# Patient Record
Sex: Female | Born: 1945
Health system: Southern US, Community
[De-identification: ages and names within clinical notes are randomized; demographics above are authoritative.]

## PROBLEM LIST (undated history)

## (undated) DIAGNOSIS — I219 Acute myocardial infarction, unspecified: Secondary | ICD-10-CM

## (undated) DIAGNOSIS — E119 Type 2 diabetes mellitus without complications: Secondary | ICD-10-CM

## (undated) DIAGNOSIS — K219 Gastro-esophageal reflux disease without esophagitis: Secondary | ICD-10-CM

## (undated) DIAGNOSIS — R0602 Shortness of breath: Secondary | ICD-10-CM

## (undated) DIAGNOSIS — I2510 Atherosclerotic heart disease of native coronary artery without angina pectoris: Secondary | ICD-10-CM

## (undated) DIAGNOSIS — E785 Hyperlipidemia, unspecified: Secondary | ICD-10-CM

## (undated) DIAGNOSIS — E039 Hypothyroidism, unspecified: Secondary | ICD-10-CM

## (undated) DIAGNOSIS — J45909 Unspecified asthma, uncomplicated: Secondary | ICD-10-CM

## (undated) DIAGNOSIS — I10 Essential (primary) hypertension: Secondary | ICD-10-CM

## (undated) HISTORY — PX: CORONARY ARTERY BYPASS GRAFT: SHX141

## (undated) HISTORY — PX: VAGINAL HYSTERECTOMY: SUR661

## (undated) HISTORY — PX: WRIST SURGERY: SHX841

---

## 2000-04-11 ENCOUNTER — Inpatient Hospital Stay (HOSPITAL_COMMUNITY): Admission: AD | Admit: 2000-04-11 | Discharge: 2000-04-20 | Payer: Self-pay | Admitting: Cardiology

## 2000-04-12 ENCOUNTER — Encounter: Payer: Self-pay | Admitting: Thoracic Surgery (Cardiothoracic Vascular Surgery)

## 2000-04-13 ENCOUNTER — Encounter: Payer: Self-pay | Admitting: Thoracic Surgery (Cardiothoracic Vascular Surgery)

## 2000-04-14 ENCOUNTER — Encounter: Payer: Self-pay | Admitting: Thoracic Surgery (Cardiothoracic Vascular Surgery)

## 2007-08-14 ENCOUNTER — Ambulatory Visit: Payer: Self-pay | Admitting: Oncology

## 2011-04-16 NOTE — Discharge Summary (Signed)
[place]  Patient:    Tonya Richard, Tonya Richard                       MRN: [id number] Adm. Date:  [id number] Disc. Date: [id number] Attending:  Modesto Charon Dictator:   Marcellus Scott, P.A. CC:         Ocie Doyne, M.D., [facility], [address] [place], [place], [place] [address]             Despina Hick, M.D., [facility]             [facility], [address], [place], [place] [address]                           Discharge Summary  DATE OF BIRTH:  2045-12-26  FINAL DIAGNOSES: 1. Unstable angina. 2. Severe three-vessel atherosclerotic coronary artery disease with    50-60% left main stenosis. 3. Postoperative anemia requiring transfusion on postoperative day #1.  SECONDARY DIAGNOSES 1. Type 2 diabetes mellitus, insulin dependent. 2. Hyperlipidemia. 3. Bronchospastic lung disease. 4. Hypothyroidism. 5. Status post hysterectomy. 6. History of tobacco habituation having quit 15 years ago. 7. Hypertension.  PROCEDURES:  On Apr 12, 2000, coronary artery bypass graft surgery x 4 by Revonda Standard. Roxan Hockey, M.D.  In this procedure, the left internal mammary artery was connected in an end-to-side fashion to the left anterior descending coronary artery, the left internal mammary artery was connected in an end-to-side fashion to the left anterior descending coronary artery, a reverse saphenous vein graft was fashioned from the aorta to the first diagonal, a reverse saphenous vein graft was fashioned from the aorta to the first obtuse marginal, and a reverse saphenous vein graft was fashioned from the aorta to the distal right coronary artery.  The patient tolerated the procedure well and was transferred in stable satisfactory condition to the intensive care unit under no inotropic support.  DISPOSITION:  Tonya Richard is a suitable candidate for discharge on postoperative day #8.  She has been afebrile in the  postoperative period.  She has experienced no cardiac dysrhythmias and no respiratory compromise.  She was relieved of all supplement oxygen by postoperative day #5.  She was some 20 pounds fluid overloaded in the postoperative period.  This was improved with gentle diuresis and she will go home on continuing Lasix diuresis to achieve her normal weight.  She has been started on an ACE inhibitor and Lopressor while in the hospital.  She is ambulating well without desaturation. Her appetite is improving.  She has full GI tract function.  Her incisions are healing nicely.  There is no evidence of erythema, drainage, or swelling.  Her mental status has remained clear in the postoperative period.  DISCHARGE MEDICATIONS:  1. Percocet 5/325 mg one to two tablets p.o. q.4-6h. p.r.n. pain.  2. Glucophage 500 mg two tablets in the morning, one tablet at noon, and two     tablets at bedtime.  3. Glucotrol XL 10 mg one tablet in the morning.  4. Altace 1.25 mg b.i.d.  5. Folic acid 1 mg daily.  6. Ferrous sulfate 325 mg b.i.d.  7. Lasix 40 mg daily.  8. Potassium chloride 20 mEq daily.  9. Lopressor 50 mg one half tablet in the morning and one half tablet in the  evening. 10. Actos 30 mg daily. 11. Premarin 0.625 mg daily. 12. Synthroid 50 mcg daily. 13. Enteric-coated aspirin 325 mg daily. 14. Multivitamins daily. 15. Humulin N 30 units at bedtime subcutaneously.  DISCHARGE ACTIVITY:  Ambulation as tolerated.  She is asked not to lift more than 10 pounds nor to drive for the six weeks.  DISCHARGE DIET:  Low-sodium, low-cholesterol, ADA diet.  WOUND CARE:  She may bathe daily, keeping incisions clean and dry.  FOLLOW-UP:  Office visit with Despina Hick, M.D.  She is asked to arrange for the visit two weeks after discharge.  A chest x-ray will be taken at Dr. Louis Matte office.  She will also have an office visit with Remo Lipps C. Roxan Hockey, M.D., three weeks after discharge and Dr.  Celestia Khat office will call with the appointment.  She is asked to bring the chest x-ray to the appointment.  She is also urged to see Lucienne Capers, M.D., her primary care giver, to check in about diabetes control.  She is asked to make the appointment one week after discharge.  HISTORY OF PRESENT ILLNESS:  Tonya Richard is a 65 year old female with a history of type 2 diabetes mellitus and hyperlipidemia, who was transferred on Apr 11, 2000, from [facility] by Despina Hick, M.D., for probable coronary artery bypass graft surgery.  She underwent left heart catheterization on Apr 11, 2000.  The study demonstrated severe three vessel atherosclerotic coronary artery disease and a 50-60% distal left main occlusion.  The patient reports a one-month history of substernal chest tightness lasting 10-15 minutes whenever she performed exertional activity, for example working in the garden or lifting things at work.  Her chest symptoms would sometimes occur at rest.  She thought it was gastrointestinal distress.  She did not experience radiation to the neck or arms nor nausea or diaphoresis.  She did get short of breath with the chest pain and this was relieved with rest.  She had a negative dobutamine Cardiolite study at [facility], but despite Dr. Woody Seller felt that she needed diagnostic catheterization.  She arrives at [employer] in transfer from [facility] on Apr 11, 2000, in stable condition.  HOSPITAL COURSE:  After arrival at [facility] on Apr 11, 2000, with a diagnosis of severe three-vessel atherosclerotic coronary artery disease, she was seen by Carlena Bjornstad, M.D., of the [facility].  The catheterization films were reviewed concurrently with Remo Lipps C. Roxan Hockey, M.D., of the [facility] [place].  The conclusion was that she has severe three-vessel atherosclerotic  coronary artery disease and was in need of coronary artery bypass graft surgery.  She was not placed on IV heparin or IV nitroglycerin  since she has been stable.  Dr. Roxan Hockey described the risks and benefits of revascularization surgery.  The patient accepted these risks and elected to undergo the procedure.  This was done on Apr 12, 2000.  Four bypasses were placed as previously described.  She was transferred in stable and satisfactory condition to the intensive care unit on no inotropic support. She was extubated on the day of surgery.  On postoperative day #1, her hemoglobin was 7.8, falling to 7.2 through the day.  She was transfused with one unit of packed red blood cells.  She was in a sinus rhythm and has remained in sinus rhythm throughout her postoperative course.  She has also been afebrile throughout her postoperative course.  Her creatinine  on postoperative day #1 was 0.6.  On postoperative day #2, after transfusion, her hematocrit was 26%.  She was somewhat hypotensive.  She was 20 pounds fluid positive.  On postoperative day #3, she was achieving 97% oxygen saturations on 1 L of nasal cannula.  A gentle diuresis was begun since she had achieved a normotensive state.  On postoperative day #4, her hemoglobin was 8.4.  She was achieving 98% oxygen saturations on 1 L of nasal cannula.  She was started on iron supplementation.  She was 17 pounds fluid overload.  On postoperative day #5, she was relieved of all supplemental oxygen, achieving good oxygen saturations on room air.  Her hemoglobin was 7.7.  She was restarted on her Glucophage which she had been taking preoperatively and she had an acceptable creatinine level for restarting this drug.  She was also started on an ACE 1 inhibitor.  On postoperative day #6, her hemoglobin was 8.2.  She was only 14 pounds fluid overloaded.  Her pacing wires were discontinued since she had maintained a sinus rhythm postoperatively.  On  postoperative day #7, she was achieving 95% oxygen saturation on room air.  Her wounds looked good.  The incisions were healing nicely without evidence of erythema or drainage.  The serum blood glucose was well controlled.  She was on all of her oral hypoglycemic medications, including Glucophage, Glucotrol, and Actos.  She was judged a suitable candidate for discharge on Apr 20, 2000, postoperative day #8.  She goes home on the medications as described with follow-up with both Despina Hick, M.D., and Revonda Standard. Roxan Hockey, M.D. DD:  04/19/00 TD:  04/22/00 Job: [id number]

## 2011-04-16 NOTE — Op Note (Signed)
[place]  Patient:    Tonya Richard, Tonya Richard                       MRN: [id number] Proc. Date: 04/12/00 Adm. Date:  [id number] Attending:  Modesto Charon CC:         Despina Hick, M.D.             Dr. Wende Neighbors, [place]                           Operative Report  PREOPERATIVE DIAGNOSIS:  Three-vessel coronary disease, unstable angina.  POSTOPERATIVE DIAGNOSIS:  Three-vessel coronary disease, unstable angina.  OPERATION PERFORMED:  Median sternotomy, extracorporeal circulation, coronary artery bypass grafting x 4 (left internal mammary artery to left anterior descending, saphenous vein graft to first diagonal, saphenous vein graft to first obtuse marginal, saphenous vein graft to distal right coronary artery).  SURGEON:  Revonda Standard. Roxan Hockey, M.D.  ASSISTANT:  Len Childs, M.D.  ANESTHESIA:  General.  OPERATIVE FINDINGS:  Diffusely diseased coronaries, right coronary poor target, small vein but of good quality, small mammary good quality.  Normal left ventricular size and function.  INDICATIONS FOR PROCEDURE:  The patient is a 65 year old female with a history of insulin-dependent diabetes and hypertension and remote tobacco abuse.  She presented with both exertional and rest anginal symptoms, underwent cardiac catheterization by Dr. Woody Seller which showed severe three-vessel coronary disease and left main disease.  She was referred for coronary artery bypass grafting. The indications, risks, benefits and alternative treatments were discussed in detail with the patient and her family.  She understood these and agreed to proceed.  DESCRIPTION OF PROCEDURE:  The patient was brought to the preop holding area on Apr 12, 2000.  Lines were placed to monitor arterial, central venous and pulmonary arterial pressure.  EKG leads were placed for continuous telemetry. The patient was taken to the operating room, anesthetized and intubated.   A Foley catheter was placed.  Intravenous antibiotics were administered.  The chest abdomen and legs were prepped and draped in the usual fashion.  A median sternotomy was performed.  Simultaneously, incision was made in the medial aspect of the left leg and the greater saphenous vein was harvested from the ankle to the lower thigh.  The saphenous vein was of small caliber but good quality with no varicosities and few branches.  The left internal mammary artery was harvested in the standard fashion.  It was a small vessel but of good quality.  It was divided distally after fully heparinizing the patient.  There was good flow through the vessel.  The mammary was placed in a papaverine soaked sponge and placed into the left pleural space.  The pericardium was opened.  The ascending aorta was palpated.  The aorta was small approximately 2.5 cm in diameter.  There was no palpable atherosclerotic disease.  The aorta was cannulated via concentric 2-0 Ethibond nonpledgeted pursestring sutures. The dual stage venous cannula was placed via a pursestring suture in the right atrial appendage.  Cardiopulmonary bypass was instituted.  The patient was cooled to 32 degrees Celsius.  The coronary arteries were inspected.  Anastomotic sites were chosen.  The conduits were inspected and cut to length.  A foam pad was placed in the pericardium to protect the left phrenic nerve and a temperature probe was placed in the myocardial septum.  A cardioplegia cannula was placed  in the ascending aorta.  The aorta was crossclamped.  The left ventricle was emptied via the aortic root vent.  Cardiac arrest then was achieved with a combination of cold antegrade blood cardioplegia and topical iced saline. 500 cc of cardioplegia was administered.  The myocardial septal temperature was 9 degrees Celsius. The following distal anastomoses were performed.  First a reversed saphenous vein graft was placed end-to-side to the  distal right coronary.  The distal right coronary was a poor quality vessel.  It was heavily diseased.  The right coronary was totaled proximally. There were multiple smal branches coming off the distal right coronary artery and a 1 mm probe did pass into several of these branches.  None of these branches were large enough to graft separately.  There was some atherosclerotic disease at the site of the anastomosis.  The anastomosis was performed end-to-side using a running 7-0 Prolene suture.  Additional cardioplegia was administered down the graft.  There was good hemostasis at the anastomosis.  Next a reversed saphenous vein graft was placed end-to-side to the large obtuse marginal branch of the left circumflex coronary artery.  There was only one dominant marginal branch supplying the posterolateral wall.  This was a 1.5 mm vessel.  It was diffusely diseased but there was no significant atherosclerotic disease at the site of the anastomosis.  The vein graft again was relatively small in caliber but of good quality.  The coronary was a 1.5 mm vessel.  The anastomosis was performed end-to-side using a running 7-0 Prolene suture.  Additional cardioplegia was administered down the graft. There was slight bleeding at the toe of the anastomosis which was repaired with a single adventitial 7-0 figure-of-eight suture.  Next a reversed saphenous vein graft was placed end-to-side to the first diagonal branch of the LAD.  This was a large anterolateral branch.  It was 1.5 mm in diameter.  It was also diffusely diseased but was free of disease at the site of the anastomosis.  The vein graft was again small caliber but of good quality.  The anastomosis was performed with a running 7-0 Prolene suture in an end-to-side fashion.  Additional cardioplegia was administered down the three vein grafts.  There was good hemostasis at the diagonal anastomosis.  Next the left internal mammary artery was  brought through a window in the pericardium anterior to the left phrenic nerve. The distal end was cut to  length and spatulated in preparation for the end-to-side anastomosis.  The LAD was opened.  It was a 1.5 mm good quality vessel at the site of the anastomosis although there was still mild atherosclerotic disease throughout the course of the vessel.  There was a tight stenosis proximally.  The anastomosis was performed end-to-side using a running 8-0 Prolene suture.  At the completion of the mammary to LAD anastomosis, the bulldog clamp was removed from the mammary artery.  Immediate and rapid septal rewarming was noted.  Lidocaine was administered.  The mammary pedicle was tacked to the epicardial surface of the heart with 6-0 Prolene sutures.  The aortic crossclamp was removed.  Total crossclamp time was 50 minutes.  A single defibrillation with 20 joules was required. The patient then resumed spontaneous sinus rhythm.  A partial occlusion clamp was placed on the ascending aorta.  The cardioplegia was removed.  Proximal vein graft anastomoses were performed to 4.0 mm punch aortotomies using running 6-0 Prolene sutures.  At the completion of the final proximal anastomosis the patient was  placed in Trendelenburg position.  Air was allowed to vent as the partial clamp was removed prior to tying the suture.  Air was then aspirated from each of the vein grafts.  The bulldog clamps were removed and flow was restored through all the vein grafts.  The proximal anastomoses were inspected for hemostasis.  Epicardial pacing wires were placed on the right ventricle and right atrium and the patient was rewarmed.  The patient was weaned from cardiopulmonary bypass when the core temperature reached 37 degrees Celsius.  Total bypass time was 103 minutes. The patient was on no inotropic support at the time of separation from bypass.  A test dose of protamine was administered and was well  tolerated.  The atrial and aortic cannulae were removed.  There was good hemostasis at both cannulation sites.  The remainder of the protamine was administered without incident.  Hemostasis was achieved.  The chest was irrigated with 1L of warm normal saline containing 1 gm of vancomycin. The left pleural and two mediastinal chest tubes were placed through separate subcostal incisions.  The pericardium was reapproximated over the aorta and base of the heart with interrupted 3-0 silk sutures.  The sternum was closed with interrupted stainless steel wires and the pectoralis fascia was closed with a running #1 Vicryl suture.  In both the chest and the legs, the subcutaneous tissues were closed with running 2-0 Vicryl suture and the skin was closed with 3-0 Vicryl subcuticular suture.  Sponge, needle and instrument counts were correct at the end of the procedure.  The patient remained hemodynamically stable throughout the postbypass period and was taken from the operating room to the surgical intensive care unit in stable condition.  DD:  04/12/00 TD:  04/15/00 Job: [id number]

## 2014-08-16 ENCOUNTER — Inpatient Hospital Stay (HOSPITAL_COMMUNITY)
Admission: AD | Admit: 2014-08-16 | Discharge: 2014-08-20 | DRG: 683 | Disposition: A | Discharge status: Home / Self Care | Payer: Medicare Other | Source: Other Acute Inpatient Hospital | Attending: Internal Medicine | Admitting: Internal Medicine

## 2014-08-16 ENCOUNTER — Encounter (HOSPITAL_COMMUNITY): Payer: Self-pay | Admitting: Internal Medicine

## 2014-08-16 DIAGNOSIS — E872 Acidosis, unspecified: Secondary | ICD-10-CM

## 2014-08-16 DIAGNOSIS — Z9861 Coronary angioplasty status: Secondary | ICD-10-CM

## 2014-08-16 DIAGNOSIS — N179 Acute kidney failure, unspecified: Secondary | ICD-10-CM | POA: Diagnosis present

## 2014-08-16 DIAGNOSIS — J45909 Unspecified asthma, uncomplicated: Secondary | ICD-10-CM

## 2014-08-16 DIAGNOSIS — E118 Type 2 diabetes mellitus with unspecified complications: Secondary | ICD-10-CM

## 2014-08-16 DIAGNOSIS — J020 Streptococcal pharyngitis: Secondary | ICD-10-CM

## 2014-08-16 DIAGNOSIS — Z7902 Long term (current) use of antithrombotics/antiplatelets: Secondary | ICD-10-CM | POA: Diagnosis not present

## 2014-08-16 DIAGNOSIS — Z6833 Body mass index (BMI) 33.0-33.9, adult: Secondary | ICD-10-CM | POA: Diagnosis not present

## 2014-08-16 DIAGNOSIS — E162 Hypoglycemia, unspecified: Secondary | ICD-10-CM

## 2014-08-16 DIAGNOSIS — I509 Heart failure, unspecified: Secondary | ICD-10-CM | POA: Diagnosis present

## 2014-08-16 DIAGNOSIS — I2510 Atherosclerotic heart disease of native coronary artery without angina pectoris: Secondary | ICD-10-CM | POA: Diagnosis present

## 2014-08-16 DIAGNOSIS — E785 Hyperlipidemia, unspecified: Secondary | ICD-10-CM | POA: Diagnosis present

## 2014-08-16 DIAGNOSIS — Z87891 Personal history of nicotine dependence: Secondary | ICD-10-CM

## 2014-08-16 DIAGNOSIS — Z951 Presence of aortocoronary bypass graft: Secondary | ICD-10-CM | POA: Diagnosis not present

## 2014-08-16 DIAGNOSIS — R197 Diarrhea, unspecified: Secondary | ICD-10-CM

## 2014-08-16 DIAGNOSIS — Z7982 Long term (current) use of aspirin: Secondary | ICD-10-CM | POA: Diagnosis not present

## 2014-08-16 DIAGNOSIS — E875 Hyperkalemia: Secondary | ICD-10-CM | POA: Diagnosis present

## 2014-08-16 DIAGNOSIS — E1169 Type 2 diabetes mellitus with other specified complication: Secondary | ICD-10-CM | POA: Diagnosis present

## 2014-08-16 DIAGNOSIS — Z794 Long term (current) use of insulin: Secondary | ICD-10-CM | POA: Diagnosis not present

## 2014-08-16 DIAGNOSIS — Z79899 Other long term (current) drug therapy: Secondary | ICD-10-CM

## 2014-08-16 DIAGNOSIS — E860 Dehydration: Secondary | ICD-10-CM | POA: Diagnosis present

## 2014-08-16 DIAGNOSIS — E119 Type 2 diabetes mellitus without complications: Secondary | ICD-10-CM | POA: Diagnosis present

## 2014-08-16 DIAGNOSIS — E039 Hypothyroidism, unspecified: Secondary | ICD-10-CM | POA: Diagnosis present

## 2014-08-16 DIAGNOSIS — I10 Essential (primary) hypertension: Secondary | ICD-10-CM | POA: Diagnosis present

## 2014-08-16 DIAGNOSIS — K219 Gastro-esophageal reflux disease without esophagitis: Secondary | ICD-10-CM | POA: Diagnosis present

## 2014-08-16 DIAGNOSIS — R1110 Vomiting, unspecified: Secondary | ICD-10-CM

## 2014-08-16 DIAGNOSIS — A0472 Enterocolitis due to Clostridium difficile, not specified as recurrent: Secondary | ICD-10-CM | POA: Diagnosis present

## 2014-08-16 DIAGNOSIS — N289 Disorder of kidney and ureter, unspecified: Secondary | ICD-10-CM | POA: Diagnosis present

## 2014-08-16 HISTORY — DX: Streptococcal pharyngitis: J02.0

## 2014-08-16 HISTORY — DX: Acute myocardial infarction, unspecified: I21.9

## 2014-08-16 HISTORY — DX: Essential (primary) hypertension: I10

## 2014-08-16 HISTORY — DX: Unspecified asthma, uncomplicated: J45.909

## 2014-08-16 HISTORY — DX: Shortness of breath: R06.02

## 2014-08-16 HISTORY — DX: Acidosis, unspecified: E87.20

## 2014-08-16 HISTORY — DX: Acute kidney failure, unspecified: N17.9

## 2014-08-16 HISTORY — DX: Hyperkalemia: E87.5

## 2014-08-16 HISTORY — DX: Gastro-esophageal reflux disease without esophagitis: K21.9

## 2014-08-16 HISTORY — DX: Atherosclerotic heart disease of native coronary artery without angina pectoris: I25.10

## 2014-08-16 HISTORY — DX: Vomiting, unspecified: R11.10

## 2014-08-16 HISTORY — DX: Morbid (severe) obesity due to excess calories: E66.01

## 2014-08-16 HISTORY — DX: Vomiting, unspecified: R19.7

## 2014-08-16 HISTORY — DX: Hypothyroidism, unspecified: E03.9

## 2014-08-16 HISTORY — DX: Hyperlipidemia, unspecified: E78.5

## 2014-08-16 HISTORY — DX: Acidosis: E87.2

## 2014-08-16 HISTORY — DX: Type 2 diabetes mellitus without complications: E11.9

## 2014-08-16 HISTORY — DX: Hypoglycemia, unspecified: E16.2

## 2014-08-16 LAB — URINALYSIS, ROUTINE W REFLEX MICROSCOPIC
Bilirubin Urine: NEGATIVE
Glucose, UA: NEGATIVE mg/dL
KETONES UR: NEGATIVE mg/dL
Leukocytes, UA: NEGATIVE
NITRITE: NEGATIVE
Protein, ur: NEGATIVE mg/dL
Specific Gravity, Urine: 1.01 (ref 1.005–1.030)
Urobilinogen, UA: 0.2 mg/dL (ref 0.0–1.0)
pH: 5 (ref 5.0–8.0)

## 2014-08-16 LAB — CBC WITH DIFFERENTIAL/PLATELET
BASOS ABS: 0 10*3/uL (ref 0.0–0.1)
BASOS PCT: 0 % (ref 0–1)
Eosinophils Absolute: 0 10*3/uL (ref 0.0–0.7)
Eosinophils Relative: 0 % (ref 0–5)
HCT: 33.7 % — ABNORMAL LOW (ref 36.0–46.0)
HEMOGLOBIN: 10.6 g/dL — AB (ref 12.0–15.0)
Lymphocytes Relative: 27 % (ref 12–46)
Lymphs Abs: 2.6 10*3/uL (ref 0.7–4.0)
MCH: 27.2 pg (ref 26.0–34.0)
MCHC: 31.5 g/dL (ref 30.0–36.0)
MCV: 86.4 fL (ref 78.0–100.0)
MONOS PCT: 8 % (ref 3–12)
Monocytes Absolute: 0.8 10*3/uL (ref 0.1–1.0)
NEUTROS PCT: 65 % (ref 43–77)
Neutro Abs: 6.2 10*3/uL (ref 1.7–7.7)
Platelets: 263 10*3/uL (ref 150–400)
RBC: 3.9 MIL/uL (ref 3.87–5.11)
RDW: 14.3 % (ref 11.5–15.5)
WBC: 9.6 10*3/uL (ref 4.0–10.5)

## 2014-08-16 LAB — GLUCOSE, CAPILLARY
GLUCOSE-CAPILLARY: 82 mg/dL (ref 70–99)
GLUCOSE-CAPILLARY: 112 mg/dL — AB (ref 70–99)
GLUCOSE-CAPILLARY: 65 mg/dL — AB (ref 70–99)
Glucose-Capillary: 121 mg/dL — ABNORMAL HIGH (ref 70–99)
Glucose-Capillary: 52 mg/dL — ABNORMAL LOW (ref 70–99)

## 2014-08-16 LAB — RENAL FUNCTION PANEL
Albumin: 2.8 g/dL — ABNORMAL LOW (ref 3.5–5.2)
Anion gap: 22 — ABNORMAL HIGH (ref 5–15)
BUN: 81 mg/dL — ABNORMAL HIGH (ref 6–23)
CHLORIDE: 100 meq/L (ref 96–112)
CO2: 17 meq/L — AB (ref 19–32)
Calcium: 8.6 mg/dL (ref 8.4–10.5)
Creatinine, Ser: 7.94 mg/dL — ABNORMAL HIGH (ref 0.50–1.10)
GFR, EST AFRICAN AMERICAN: 5 mL/min — AB (ref >90)
GFR, EST NON AFRICAN AMERICAN: 5 mL/min — AB (ref >90)
Glucose, Bld: 64 mg/dL — ABNORMAL LOW (ref 70–99)
Phosphorus: 6.9 mg/dL — ABNORMAL HIGH (ref 2.3–4.6)
Potassium: 5.5 mEq/L — ABNORMAL HIGH (ref 3.7–5.3)
Sodium: 139 mEq/L (ref 137–147)

## 2014-08-16 LAB — HEPATIC FUNCTION PANEL
ALT: 10 U/L (ref 0–35)
AST: 18 U/L (ref 0–37)
Albumin: 2.8 g/dL — ABNORMAL LOW (ref 3.5–5.2)
Alkaline Phosphatase: 59 U/L (ref 39–117)
Bilirubin, Direct: <0.2 mg/dL (ref 0.0–0.3)
Total Bilirubin: <0.2 mg/dL — ABNORMAL LOW (ref 0.3–1.2)
Total Protein: 6.1 g/dL (ref 6.0–8.3)

## 2014-08-16 LAB — URINE MICROSCOPIC-ADD ON

## 2014-08-16 LAB — CREATININE, URINE, RANDOM: Creatinine, Urine: 19.76 mg/dL

## 2014-08-16 LAB — CK: CK TOTAL: 126 U/L (ref 7–177)

## 2014-08-16 LAB — SODIUM, URINE, RANDOM: Sodium, Ur: 79 mEq/L

## 2014-08-16 LAB — LACTIC ACID, PLASMA: Lactic Acid, Venous: 3.6 mmol/L — ABNORMAL HIGH (ref 0.5–2.2)

## 2014-08-16 MED ORDER — ACETAMINOPHEN 325 MG PO TABS
650.0000 mg | ORAL_TABLET | Freq: Four times a day (QID) | ORAL | Status: DC | PRN
  Administered 2014-08-16 – 2014-08-17 (×2): 650 mg via ORAL
  Filled 2014-08-16 (×2): qty 2

## 2014-08-16 MED ORDER — LEVOTHYROXINE SODIUM 50 MCG PO TABS
50.0000 ug | ORAL_TABLET | Freq: Every day | ORAL | Status: DC
  Administered 2014-08-17 – 2014-08-20 (×4): 50 ug via ORAL
  Filled 2014-08-16 (×5): qty 1

## 2014-08-16 MED ORDER — STERILE WATER FOR INJECTION IV SOLN
INTRAVENOUS | Status: DC
  Administered 2014-08-16: 18:00:00 via INTRAVENOUS
  Filled 2014-08-16 (×2): qty 9.7

## 2014-08-16 MED ORDER — ISOSORBIDE MONONITRATE ER 30 MG PO TB24
30.0000 mg | ORAL_TABLET | Freq: Every day | ORAL | Status: DC
  Administered 2014-08-16 – 2014-08-20 (×5): 30 mg via ORAL
  Filled 2014-08-16 (×5): qty 1

## 2014-08-16 MED ORDER — CLOPIDOGREL BISULFATE 75 MG PO TABS
75.0000 mg | ORAL_TABLET | Freq: Every day | ORAL | Status: DC
  Administered 2014-08-16 – 2014-08-20 (×5): 75 mg via ORAL
  Filled 2014-08-16 (×5): qty 1

## 2014-08-16 MED ORDER — ASPIRIN EC 81 MG PO TBEC
81.0000 mg | DELAYED_RELEASE_TABLET | Freq: Every day | ORAL | Status: DC
  Administered 2014-08-17 – 2014-08-20 (×4): 81 mg via ORAL
  Filled 2014-08-16 (×4): qty 1

## 2014-08-16 MED ORDER — INSULIN ASPART 100 UNIT/ML ~~LOC~~ SOLN
0.0000 [IU] | Freq: Three times a day (TID) | SUBCUTANEOUS | Status: DC
  Administered 2014-08-17: 13:00:00 via SUBCUTANEOUS
  Administered 2014-08-17: 2 [IU] via SUBCUTANEOUS
  Administered 2014-08-17 – 2014-08-18 (×3): 3 [IU] via SUBCUTANEOUS
  Administered 2014-08-19: 7 [IU] via SUBCUTANEOUS
  Administered 2014-08-19 (×2): 5 [IU] via SUBCUTANEOUS
  Administered 2014-08-20: 7 [IU] via SUBCUTANEOUS
  Administered 2014-08-20: 2 [IU] via SUBCUTANEOUS

## 2014-08-16 MED ORDER — IPRATROPIUM-ALBUTEROL 0.5-2.5 (3) MG/3ML IN SOLN
3.0000 mL | Freq: Four times a day (QID) | RESPIRATORY_TRACT | Status: DC | PRN
  Filled 2014-08-16: qty 3

## 2014-08-16 MED ORDER — HEPARIN SODIUM (PORCINE) 5000 UNIT/ML IJ SOLN
5000.0000 [IU] | Freq: Three times a day (TID) | INTRAMUSCULAR | Status: DC
  Administered 2014-08-17 – 2014-08-20 (×11): 5000 [IU] via SUBCUTANEOUS
  Filled 2014-08-16 (×12): qty 1

## 2014-08-16 MED ORDER — METOPROLOL SUCCINATE ER 50 MG PO TB24
50.0000 mg | ORAL_TABLET | Freq: Every day | ORAL | Status: DC
  Administered 2014-08-16 – 2014-08-20 (×5): 50 mg via ORAL
  Filled 2014-08-16 (×5): qty 1

## 2014-08-16 MED ORDER — ONDANSETRON HCL 4 MG PO TABS: 4.0000 mg | ORAL_TABLET | Freq: Four times a day (QID) | ORAL | Status: DC | PRN

## 2014-08-16 MED ORDER — ALPRAZOLAM 0.5 MG PO TABS
0.5000 mg | ORAL_TABLET | Freq: Every day | ORAL | Status: DC
  Administered 2014-08-17 – 2014-08-19 (×4): 0.5 mg via ORAL
  Filled 2014-08-16 (×4): qty 1

## 2014-08-16 MED ORDER — STERILE WATER FOR INJECTION IV SOLN
INTRAVENOUS | Status: DC
  Administered 2014-08-16 – 2014-08-18 (×3): via INTRAVENOUS
  Filled 2014-08-16 (×5): qty 850

## 2014-08-16 MED ORDER — ACETAMINOPHEN 650 MG RE SUPP: 650.0000 mg | Freq: Four times a day (QID) | RECTAL | Status: DC | PRN

## 2014-08-16 MED ORDER — ONDANSETRON HCL 4 MG/2ML IJ SOLN: 4.0000 mg | Freq: Four times a day (QID) | INTRAMUSCULAR | Status: DC | PRN

## 2014-08-16 NOTE — H&P (Addendum)
[facility] History and Physical  Tonya Richard [id number] DOB: November 13, 1946 DOA: 08/16/2014  Referring physician:  PCP: Wende Neighbors   Chief Complaint:   HPI: Tonya Richard Regina is a 68 y.o. female  With history of heart disease, diabetes type 2, hypothyroidism, hypertension, hyperlipidemia and recent strep pharyngitis who was transferred to [facility] from [place] emergency room with acute renal failure. She had labs drawn and are primary care provider's office yesterday and was told to come to the emergency room.  In the office, his BUN was 60, creatinine 6.47, potassium 5.3, bicarbonate 18. Today in the emergency room, BUN was 82 creatinine 8.2, potassium 5.2, bicarbonate 15, glucose 50.  Transferred to [facility] as Tonya Richard has no nephrologist.  CT abdomen pelvis without contrast shows normal kidneys without hydronephrosis.  Takes ibuprofen occasionally, about once a week. Is on ramipril, metformin, Lasix, as well as multiple other medications. In the emergency room, she got several amps of D50, 2 L of saline and 4 mg of IV Zofran  Patient has been on amoxicillin for about a week for the strep pharyngitis. Over the past few days, she has had periods of severe vomiting and diarrhea, no hematemesis or melena. No definite fevers or chills. No abdominal pain.  Review of Systems:  Systems reviewed. As above, otherwise negative.  Past Medical History  Diagnosis Date  . CAD (coronary artery disease), native coronary artery     h/o CABG, stent  . Hyperlipidemia   . DM type 2 (diabetes mellitus, type 2)   . Hypothyroidism   . GERD (gastroesophageal reflux disease)    Past Surgical History  Procedure Laterality Date  . Coronary artery bypass graft    . Vaginal hysterectomy    . Wrist surgery     Social History:  reports that she has quit smoking. She does not have any smokeless tobacco history on file. She reports that she does not drink alcohol or use illicit  drugs.  No Known Allergies  Family History  Problem Relation Age of Onset  . Diabetes Father   . Heart disease Father   . Heart disease Brother      Prior to Admission medications   Medication Sig Start Date End Date Taking? Authorizing Provider  ALPRAZolam Duanne Moron) 0.5 MG tablet Take 0.5 mg by mouth at bedtime.   Yes Historical Provider, MD  amoxicillin (AMOXIL) 500 MG capsule Take 1,000 mg by mouth 2 (two) times daily. #56 filled 08/09/14   Yes Historical Provider, MD  aspirin EC 81 MG tablet Take 81 mg by mouth daily.   Yes Historical Provider, MD  atorvastatin (LIPITOR) 80 MG tablet Take 80 mg by mouth daily.   Yes Historical Provider, MD  clopidogrel (PLAVIX) 75 MG tablet Take 75 mg by mouth daily.   Yes Historical Provider, MD  ezetimibe (ZETIA) 10 MG tablet Take 10 mg by mouth daily.   Yes Historical Provider, MD  furosemide (LASIX) 40 MG tablet Take 40 mg by mouth daily.   Yes Historical Provider, MD  glimepiride (AMARYL) 2 MG tablet Take 2 mg by mouth 4 (four) times daily.   Yes Historical Provider, MD  insulin NPH Human (HUMULIN N,NOVOLIN N) 100 UNIT/ML injection Inject 60 Units into the skin 2 (two) times daily before a meal.   Yes Historical Provider, MD  ipratropium-albuterol (DUONEB) 0.5-2.5 (3) MG/3ML SOLN Take 3 mLs by nebulization 4 (four) times daily as needed (shortness of breath/wheezing).   Yes Historical Provider, MD  isosorbide mononitrate (IMDUR) 30 MG 24 hr tablet Take 30 mg by mouth daily.   Yes Historical Provider, MD  levothyroxine (SYNTHROID, LEVOTHROID) 50 MCG tablet Take 50 mcg by mouth daily before breakfast.   Yes Historical Provider, MD  metFORMIN (GLUCOPHAGE) 500 MG tablet Take 1,000 mg by mouth 2 (two) times daily with a meal.   Yes Historical Provider, MD  metoprolol succinate (TOPROL-XL) 50 MG 24 hr tablet Take 50 mg by mouth daily. Take with or immediately following a meal.   Yes Historical Provider, MD  pantoprazole (PROTONIX) 40 MG tablet Take 40 mg  by mouth daily.   Yes Historical Provider, MD  ramipril (ALTACE) 10 MG capsule Take 10 mg by mouth daily.   Yes Historical Provider, MD   Physical Exam: Filed Vitals:   08/16/14 1444 08/16/14 1700  BP: 135/59 132/66  Pulse: 51 60  Temp: 98.1 F (36.7 C) 98 F (36.7 C)  TempSrc: Oral Oral  Resp: 18 18  Height: 5\' 1"  (1.549 m)   Weight: 79.9 kg (176 lb 2.4 oz)   SpO2: 98% 98%    Wt Readings from Last 3 Encounters:  08/16/14 79.9 kg (176 lb 2.4 oz)    BP 132/66  Pulse 60  Temp(Src) 98 F (36.7 C) (Oral)  Resp 18  Ht 5\' 1"  (1.549 m)  Wt 79.9 kg (176 lb 2.4 oz)  BMI 33.30 kg/m2  SpO2 98%  General Appearance:    Alert, cooperative, no distress, appears stated age  Head:    Normocephalic, without obvious abnormality, atraumatic  Eyes:    PERRL, conjunctiva/corneas clear, EOM's intact  Nose:   Nares normal, septum midline, mucosa normal, no drainage    or sinus tenderness  Throat:   Dry MM. No tonsillar erythema or exudate  Neck:   Supple, symmetrical, trachea midline, no adenopathy;    thyroid:  no enlargement/tenderness/nodules; no carotid   bruit or JVD  Back:     Symmetric, no curvature, ROM normal, no CVA tenderness  Lungs:     Clear to auscultation bilaterally, respirations unlabored  Chest Wall:    No tenderness or deformity   Heart:    Regular rate and rhythm, S1 and S2 normal, no murmur, rub   or gallop  Abdomen:     Soft, non-tender, bowel sounds active. obese  Genitalia:    deferred  Rectal:   deferred  Extremities:   Extremities normal, atraumatic, no cyanosis or edema  Pulses:   2+ and symmetric all extremities  Skin:   Skin dry with poor turgor  Lymph nodes:   Cervical, supraclavicular, and axillary nodes normal  Neurologic:   CNII-XII intact, normal strength, sensation and reflexes    throughout             Psych: normal affect  Labs TSH 1.0 blood cell count 12,000, hemoglobin 12.3, hematocrit 39, platelet count 310,000 with a normal differential  INR 1.0, PTT 26 sodium 141 potassium 5.2 chloride 99 bicarbonate 15 glucose 50 BUN 82 creatinine 8.2 anion gap 32 calcium 9.4 albumin 4.1 total protein 7.2 SGOT 1.7 SGPT 19 phosphatase 69 osmolality 293 troponin less than 0.01  Recent Labs Lab 08/16/14 1422 08/16/14 1654  GLUCAP 121* 82    Radiological Exams on Admission: No results found.  Assessment/Plan Principal Problem:   Acute renal failure, likely prerenal in the setting of vomiting and diarrhea and ACE inhibitor. Nephrology has been consulted and and is here evaluating the patient. Will hydrate and check stat labs  as well as CPK, urinalysis Ative Problems:   DM type 2 (diabetes mellitus, type 2)With hypoglycemia: Hold all home medications and just give sliding scale for now.    CAD (coronary artery disease), native coronary artery: Continue aspirin and Plavix    Vomiting and diarrhea with recent antibiotic use, will check for C. difficile.    Strep pharyngitis, has nearly completed her course of antibiotic. Will stop antibiotics.   Hypothyroidism with a normal TSH. Continue Synthroid   GERD (gastroesophageal reflux disease) hold protonixfor now pending C. difficile    HypoglycemiaSee above   Hyperkalemia, mild repeat labs stat telemetry continue him to her and Toprol. Stop ACE inhibitor.   Essential hypertension, benign   Asthma, chronic, stable    Metabolic acidosis secondary to ARF, diarrhea, possibly metformin. Will check lactate   Morbid obesity  Code Status: full Family Communication: daughter at bedside Disposition Plan: home when improved  Time spent: 52 minutes  [facility] Pager [phone]

## 2014-08-16 NOTE — Consult Note (Signed)
Reason for Consult: Acute Kidney Failure Referring Physician: Dr. Storm Frisk HPI:  Tonya Richard is an 68 y.o. female w/ PMHx of HTN, CHF, CAD, HLD, Asthma, DM type II, transferred from [facility] on 08/16/14 for elevated Cr. Patient had recently seen her PCP on 08/09/14 for a sore throat found to be rapid strep positive (according to the patient), started on Amoxicillin at that time. The patient returned on 07/15/14 for a 3 month follow up appointment and had some blood drawn which revealed an acutely increased Cr to ~6. Review of her previous lab records shows baseline Cr around 1. Ms. Cuadras also states that over the past 1 week, she has had diarrhea, nausea, and vomiting. She claims her bowel movements have been about 5 times daily, watery in nature, w/ no blood or mucus present. She claims she has been taking imodium to help with this, but still says she will have loose stools. She also claims she has been vomiting about 3 times daily, non-bloody, non-bilious in nature. She also admits to recent dizziness and lightheadedness over the past few days. The patient denies fever, chills, chest pain, or cough. The patient also states she has had significantly low blood sugars over the past 24 hours, as low as the 30's last night.    Available creatinine data as follows: 10/03/13   1.07 02/20/14   1.17 05/15/14   1.04 08/15/14    6.47 08/16/14    8.2   Past Medical History  Diagnosis Date  . CAD (coronary artery disease), native coronary artery     h/o CABG, stent  . Hyperlipidemia   . DM type 2 (diabetes mellitus, type 2)   . Hypothyroidism   . GERD (gastroesophageal reflux disease)     PSH:   Past Surgical History  Procedure Laterality Date  . Coronary artery bypass graft    . Vaginal hysterectomy    . Wrist surgery      Allergies: No Known Allergies  Medications:   Prior to Admission medications   Medication Sig Start Date End Date Taking? Authorizing Provider  ALPRAZolam  Duanne Moron) 0.5 MG tablet Take 0.5 mg by mouth at bedtime.   Yes Historical Provider, MD  amoxicillin (AMOXIL) 500 MG capsule Take 1,000 mg by mouth 2 (two) times daily. #56 filled 08/09/14   Yes Historical Provider, MD  aspirin EC 81 MG tablet Take 81 mg by mouth daily.   Yes Historical Provider, MD  atorvastatin (LIPITOR) 80 MG tablet Take 80 mg by mouth daily.   Yes Historical Provider, MD  clopidogrel (PLAVIX) 75 MG tablet Take 75 mg by mouth daily.   Yes Historical Provider, MD  ezetimibe (ZETIA) 10 MG tablet Take 10 mg by mouth daily.   Yes Historical Provider, MD  furosemide (LASIX) 40 MG tablet Take 40 mg by mouth daily.   Yes Historical Provider, MD  glimepiride (AMARYL) 2 MG tablet Take 2 mg by mouth 4 (four) times daily.   Yes Historical Provider, MD  insulin NPH Human (HUMULIN N,NOVOLIN N) 100 UNIT/ML injection Inject 60 Units into the skin 2 (two) times daily before a meal.   Yes Historical Provider, MD  ipratropium-albuterol (DUONEB) 0.5-2.5 (3) MG/3ML SOLN Take 3 mLs by nebulization 4 (four) times daily as needed (shortness of breath/wheezing).   Yes Historical Provider, MD  isosorbide mononitrate (IMDUR) 30 MG 24 hr tablet Take 30 mg by mouth daily.   Yes Historical Provider, MD  levothyroxine (SYNTHROID, LEVOTHROID) 50 MCG tablet Take 50  mcg by mouth daily before breakfast.   Yes Historical Provider, MD  metFORMIN (GLUCOPHAGE) 500 MG tablet Take 1,000 mg by mouth 2 (two) times daily with a meal.   Yes Historical Provider, MD  metoprolol succinate (TOPROL-XL) 50 MG 24 hr tablet Take 50 mg by mouth daily. Take with or immediately following a meal.   Yes Historical Provider, MD  pantoprazole (PROTONIX) 40 MG tablet Take 40 mg by mouth daily.   Yes Historical Provider, MD  ramipril (ALTACE) 10 MG capsule Take 10 mg by mouth daily.   Yes Historical Provider, MD    Discontinued Meds:  There are no discontinued medications.   Family History:   Family History  Problem Relation Age of Onset   . Diabetes Father   . Heart disease Father   . Heart disease Brother     Social History:  reports that she has quit smoking. She does not have any smokeless tobacco history on file. She reports that she does not drink alcohol or use illicit drugs.  Review of Systems  Constitutional: Positive for malaise/fatigue. Negative for fever, chills and diaphoresis.  HENT: Positive for sore throat. Negative for congestion and ear pain.   Respiratory: Negative for cough, hemoptysis, shortness of breath and wheezing.   Cardiovascular: Negative for chest pain, palpitations and leg swelling.  Gastrointestinal: Positive for nausea, vomiting and diarrhea. Negative for abdominal pain and blood in stool.  Genitourinary: Positive for urgency and frequency. Negative for dysuria, hematuria and flank pain.  Musculoskeletal: Negative for back pain, joint pain and myalgias.  Neurological: Positive for dizziness. Negative for loss of consciousness and headaches.    Blood pressure 132/66, pulse 60, temperature 98 F (36.7 C), temperature source Oral, resp. rate 18, height 5\' 1"  (1.549 m), weight 176 lb 2.4 oz (79.9 kg), SpO2 98.00%.  Physical Exam  General: Centrally obese female, alert, cooperative, NAD. HEENT: PERRL, EOMI. Moist mucus membranes Neck: Full range of motion without pain, supple, no lymphadenopathy or carotid bruits. No JVD. Lungs: Decreased breath sounds throughout. No crackles or wheezes present.  Heart: RRR, no murmurs, gallops, or rubs Abdomen: Soft, non-tender, non-distended, BS + Extremities: No cyanosis, clubbing, or edema Neurologic: Alert & oriented X3, cranial nerves II-XII intact, strength grossly intact, sensation intact to light touch   No results found for this basename: creatinine    Results for orders placed during the hospital encounter of 08/16/14 (from the past 48 hour(s))  GLUCOSE, CAPILLARY     Status: Abnormal   Collection Time    08/16/14  2:22 PM      Result  Value Ref Range   Glucose-Capillary 121 (*) 70 - 99 mg/dL  GLUCOSE, CAPILLARY     Status: None   Collection Time    08/16/14  4:54 PM      Result Value Ref Range   Glucose-Capillary 82  70 - 99 mg/dL    No results found.   Assessment/Plan: 68 y.o. female w/ PMHx of HTN, CHF, CAD, HLD, Asthma, and DM type II, admitted for acute renal failure.   Acute Renal Failure- Patient w/ Cr of 8.20 at [facility]. Baseline Cr ~1.0 according to previous notes and labs. She states she has never had issues w/ her kidneys in the past, that she knows of. The patient states that she has had a week of nausea, vomiting, and significant diarrhea w/ recent accompanying dizziness and lightheadedness. Given her symptoms, suspect her acute renal failure is likely prerenal 2/2 dehydration and volume depletion.  Given her recent history of strep infection, PSGN is a possibility but much less likely as this was only a recent infection (08/09/14). Orthostatic vital signs ordered, UA, urine Na/Cr, renal US. Hold ACEI and Metformin as these likely contributed acutely to renal insult.  Mild Hyperkalemia- K of 5.2 at [facility], likely 2/2 acute renal issues. EKG not suggestive of any significant changes. Repeat renal function in AM.  AG Metabolic acidosis- According to [facility] ED notes, HCO3 of 15, AG of 27. Will give IVF w/ NaHCO3 @ 75/hr Mild Leukocytosis- 12.2 prior to admission. Possibility of C. Diff given recent Amoxicillin use and subsequent diarrheal illness. C. Diff studies pending.   Luanne Bras 08/16/2014, 5:43 PM   I have seen and examined this patient and agree with plan as recommended in note of Dr. Ronnald Ramp.  68 yo WF with baseline creatinine of 1, with recent strep throat treated with amoxicillin, with a week of diarrhea, nausea, vomiting, lightheadedness. Continued to take meds including metformin and ramipril. And took at least 1 dose of ibuprofen last week.  Creatinine was 6.47 yesterday in her primary MD  office, 8.2 today in the [facility] ER. Treatment there included 2 liters of NS and transfer here.  Hopefully this is all hemodynamic related to volume depletion, suspected hypotension (given symptoms) with ACE onboard. As stated above, PSGN a possibility but would seem less likely (of course have not seen urine yet) Agree with additional gentle IVF as isotonic bicarb (she is somewhat dyspneic now), collect urine for electrolytes, obtain US, repeat labs, hold ACE, check orthostatics. Eval for possible CDiff per primary service. Will follow with you.  Maevyn Riordan B,MD 08/16/2014 5:52 PM

## 2014-08-17 ENCOUNTER — Inpatient Hospital Stay (HOSPITAL_COMMUNITY): Payer: Medicare Other

## 2014-08-17 DIAGNOSIS — E118 Type 2 diabetes mellitus with unspecified complications: Secondary | ICD-10-CM

## 2014-08-17 DIAGNOSIS — A0472 Enterocolitis due to Clostridium difficile, not specified as recurrent: Secondary | ICD-10-CM

## 2014-08-17 DIAGNOSIS — K219 Gastro-esophageal reflux disease without esophagitis: Secondary | ICD-10-CM

## 2014-08-17 HISTORY — DX: Enterocolitis due to Clostridium difficile, not specified as recurrent: A04.72

## 2014-08-17 LAB — BASIC METABOLIC PANEL
ANION GAP: 20 — AB (ref 5–15)
BUN: 83 mg/dL — ABNORMAL HIGH (ref 6–23)
CO2: 22 meq/L (ref 19–32)
CREATININE: 7.99 mg/dL — AB (ref 0.50–1.10)
Calcium: 8.3 mg/dL — ABNORMAL LOW (ref 8.4–10.5)
Chloride: 97 mEq/L (ref 96–112)
GFR calc Af Amer: 5 mL/min — ABNORMAL LOW (ref >90)
GFR calc non Af Amer: 5 mL/min — ABNORMAL LOW (ref >90)
Glucose, Bld: 87 mg/dL (ref 70–99)
Potassium: 4.1 mEq/L (ref 3.7–5.3)
SODIUM: 139 meq/L (ref 137–147)

## 2014-08-17 LAB — GLUCOSE, CAPILLARY
GLUCOSE-CAPILLARY: 168 mg/dL — AB (ref 70–99)
Glucose-Capillary: 90 mg/dL (ref 70–99)
Glucose-Capillary: 31 mg/dL — CL (ref 70–99)
Glucose-Capillary: 226 mg/dL — ABNORMAL HIGH (ref 70–99)
Glucose-Capillary: 247 mg/dL — ABNORMAL HIGH (ref 70–99)
Glucose-Capillary: 133 mg/dL — ABNORMAL HIGH (ref 70–99)

## 2014-08-17 LAB — CLOSTRIDIUM DIFFICILE BY PCR: Toxigenic C. Difficile by PCR: POSITIVE — AB

## 2014-08-17 MED ORDER — TRAMADOL HCL 50 MG PO TABS
50.0000 mg | ORAL_TABLET | Freq: Two times a day (BID) | ORAL | Status: DC | PRN
  Administered 2014-08-19: 50 mg via ORAL
  Filled 2014-08-17: qty 1

## 2014-08-17 MED ORDER — METRONIDAZOLE 500 MG PO TABS
500.0000 mg | ORAL_TABLET | Freq: Three times a day (TID) | ORAL | Status: DC
  Administered 2014-08-17 – 2014-08-20 (×9): 500 mg via ORAL
  Filled 2014-08-17 (×12): qty 1

## 2014-08-17 NOTE — Progress Notes (Signed)
[facility] PROGRESS NOTE  Tonya Richard [id number] DOB: [birth year] DOA: 08/16/2014 PCP: Wende Neighbors, MD  Assessment/Plan:  Principal Problem:   Acute renal failure: unchanged Active Problems: c diff colitis: start flagyl   DM type 2 (diabetes mellitus, type 2): CBGs labile   CAD (coronary artery disease), native coronary artery   Vomiting and diarrhea secondary to c diff. Better today   Strep pharyngitis: amoxicillin stopped   Hypothyroidism   GERD (gastroesophageal reflux disease) PPI held due to above   Hypoglycemia: only getting SSI   Hyperkalemia, mild: resolved. D/c tele   Essential hypertension, benign   Asthma, chronic   Metabolic acidosis: multifactorial: lactic acidosis, renal failure   Morbid obesity  Code Status:  full Family Communication:  Daughter at bedside Disposition Plan:    Consultants:  nephrology  Procedures:     Antibiotics:    HPI/Subjective: Had diarrhea last night. None today. No vomiting today. Good UOP. No f/c/abd pain. No dyspnea  Objective: Filed Vitals:   08/17/14 1022  BP: 116/55  Pulse: 74  Temp: 98.4 F (36.9 C)  Resp: 18    Intake/Output Summary (Last 24 hours) at 08/17/14 1545 Last data filed at 08/17/14 1313  Gross per 24 hour  Intake   1002 ml  Output   2200 ml  Net  -1198 ml   Filed Weights   08/16/14 1444 08/16/14 2124  Weight: 79.9 kg (176 lb 2.4 oz) 79.9 kg (176 lb 2.4 oz)    Exam:   General:  Nontoxic. Asleep. arousable  Cardiovascular: RRR without MGR  Respiratory: CTA without WRR  Abdomen: obese, s, nt, nd  Ext: no CCE  Basic Metabolic Panel:  Recent Labs Lab 08/16/14 2013 08/17/14 0532  NA 139 139  K 5.5* 4.1  CL 100 97  CO2 17* 22  GLUCOSE 64* 87  BUN 81* 83*  CREATININE 7.94* 7.99*  CALCIUM 8.6 8.3*  PHOS 6.9*  --    Liver Function Tests:  Recent Labs Lab 08/16/14 2013  AST 18  ALT 10  ALKPHOS 59  BILITOT <0.2*  PROT 6.1  ALBUMIN 2.8*  2.8*   No  results found for this basename: LIPASE, AMYLASE,  in the last 168 hours No results found for this basename: AMMONIA,  in the last 168 hours CBC:  Recent Labs Lab 08/16/14 2013  WBC 9.6  NEUTROABS 6.2  HGB 10.6*  HCT 33.7*  MCV 86.4  PLT 263   Cardiac Enzymes:  Recent Labs Lab 08/16/14 2013  CKTOTAL 126   BNP (last 3 results) No results found for this basename: PROBNP,  in the last 8760 hours CBG:  Recent Labs Lab 08/16/14 2227 08/17/14 0041 08/17/14 0758 08/17/14 0833 08/17/14 1202  GLUCAP 112* 90 31* 226* 247*    Recent Results (from the past 240 hour(s))  CLOSTRIDIUM DIFFICILE BY PCR     Status: Abnormal   Collection Time    08/16/14 10:30 PM      Result Value Ref Range Status   C difficile by pcr POSITIVE (*) NEGATIVE Final   Comment: CRITICAL RESULT CALLED TO, READ BACK BY AND VERIFIED WITH:     MINTZ B.,RN 08/17/14 1007 BY JONESJ     Studies: US Renal  08/17/2014   CLINICAL DATA:  Acute renal failure.  Rule out obstruction.  EXAM: RENAL/URINARY TRACT ULTRASOUND COMPLETE  COMPARISON:  CT abdomen and pelvis 08/16/2014  FINDINGS: Right Kidney:  Length: 11.8 cm. Echogenicity within normal limits. No mass or hydronephrosis visualized.  Left Kidney:  Length: 12.0 cm. Echogenicity within normal limits. No mass or hydronephrosis visualized.  Bladder:  Appears normal for degree of bladder distention.  IMPRESSION: Unremarkable renal ultrasound.  No hydronephrosis.   Electronically Signed   By: Logan Bores   On: 08/17/2014 10:26    Scheduled Meds: . ALPRAZolam  0.5 mg Oral QHS  . aspirin EC  81 mg Oral Daily  . clopidogrel  75 mg Oral Daily  . heparin  5,000 Units Subcutaneous 3 times per day  . insulin aspart  0-9 Units Subcutaneous TID WC  . isosorbide mononitrate  30 mg Oral Daily  . levothyroxine  50 mcg Oral QAC breakfast  . metoprolol succinate  50 mg Oral Daily  . metroNIDAZOLE  500 mg Oral 3 times per day   Continuous Infusions: .  sodium bicarbonate  150 mEq in sterile water 1000 mL infusion 50 mL/hr at 08/17/14 1309    Time spent: 35 minutes  [facility] Pager [phone]. If 7PM-7AM, please contact night-coverage at [email or web address], password [facility] 08/17/2014, 3:45 PM  LOS: 1 day

## 2014-08-17 NOTE — Progress Notes (Signed)
Subjective: Patient seen at bedside this AM. Very lethargic on account of hypoglycemia. Improved after 1 amp D50. Denies any pain, nausea, vomiting, fever, chills, or SOB.  Cr 7.99 this AM, more or less unchanged from previous.  ~1L output overnight.   Objective:  Medications: Infusions: .  sodium bicarbonate 150 mEq in sterile water 1000 mL infusion 75 mL/hr at 08/16/14 1844   Scheduled Medications: . ALPRAZolam  0.5 mg Oral QHS  . aspirin EC  81 mg Oral Daily  . clopidogrel  75 mg Oral Daily  . heparin  5,000 Units Subcutaneous 3 times per day  . insulin aspart  0-9 Units Subcutaneous TID WC  . isosorbide mononitrate  30 mg Oral Daily  . levothyroxine  50 mcg Oral QAC breakfast  . metoprolol succinate  50 mg Oral Daily   Continuous Infusions: .  sodium bicarbonate 150 mEq in sterile water 1000 mL infusion 75 mL/hr at 08/17/14 1029   PRN Meds:.acetaminophen, acetaminophen, ipratropium-albuterol, ondansetron (ZOFRAN) IV, ondansetron  BP 102/57  Pulse 75  Temp(Src) 98.4 F (36.9 C) (Oral)  Resp 17  Ht 5\' 1"  (1.549 m)  Wt 176 lb 2.4 oz (79.9 kg)  BMI 33.30 kg/m2  SpO2 95%   Intake/Output Summary (Last 24 hours) at 08/17/14 0720 Last data filed at 08/17/14 0700  Gross per 24 hour  Intake    222 ml  Output   1050 ml  Net   -828 ml    EXAM:  General: Centrally obese female, alert, cooperative, NAD. Lethargic. HEENT: PERRL, EOMI. Moist mucus membranes Neck: Full range of motion without pain, supple, no lymphadenopathy or carotid bruits. No JVD. Lungs: Decreased breath sounds throughout. No crackles or wheezes present.  Heart: Tachcyardic, no murmurs, gallops, or rubs Abdomen: Soft, non-tender, non-distended, BS +  Extremities: No cyanosis, clubbing, or edema Neurologic: Alert & oriented X3, cranial nerves II-XII intact, strength grossly intact, sensation intact to light touch   Labs: Basic Metabolic Panel:  Recent Labs Lab 08/16/14 2013 08/17/14 0532  NA 139  139  K 5.5* 4.1  CL 100 97  CO2 17* 22  GLUCOSE 64* 87  BUN 81* 83*  CREATININE 7.94* 7.99*  CALCIUM 8.6 8.3*  PHOS 6.9*  --     Liver Function Tests:  Recent Labs Lab 08/16/14 2013  AST 18  ALT 10  ALKPHOS 59  BILITOT <0.2*  PROT 6.1  ALBUMIN 2.8*  2.8*    CBC:  Recent Labs Lab 08/16/14 2013  WBC 9.6  NEUTROABS 6.2  HGB 10.6*  HCT 33.7*  MCV 86.4  PLT 263    Cardiac Enzymes:  Recent Labs Lab 08/16/14 2013  CKTOTAL 126    CBG:  Recent Labs Lab 08/16/14 1654 08/16/14 2126 08/16/14 2152 08/16/14 2227 08/17/14 0041  GLUCAP 82 27* 64* 112* 46    Assessment/Plan: 68 y.o. female w/ PMHx of HTN, CHF, CAD, HLD, Asthma, and DM type II, admitted for acute renal failure.  Acute Renal Failure- Patient w/ Cr of 8.20 at [facility]. Baseline Cr ~1.0 according to previous notes and labs. Cr 7.99 this AM (GFR 5). Most likely in the setting of prerenal failure 2/2 dehydration and volume depletion. Not orthostatic on admission, however, patient had previously received at least 2L IVF at [facility].  FeNa is 22.84% suggesting ATN. UA w/out protein. Only w/ small Hb and hyaline casts. Urine output ~1L recorded since admission (several non-recorded measurements on arrival). Renal US pending.  Mild Hyperkalemia- K of 5.2 at  [name], likely 2/2 acute renal issues. Resolved this AM.  AG Metabolic acidosis- According to [facility] ED notes, HCO3 of 15, AG of 27. On admission, HCO3 17 w/ AG of 22. Started on IVF IVF w/ NaHCO3 @ 75/hr. HCO3 22 this morning w/ AG of 20.  Mild Leukocytosis- Resolved. Possibility of C. Diff given recent Amoxicillin use and subsequent diarrheal illness. C. Diff studies pending.   Signed: Luanne Bras, MD 08/17/2014 7:23 AM  I have seen and examined this patient and agree with assessment and plan in the note above by Dr. Ronnald Ramp.  AKI without much change - creatinine still around 8, Fena >1.   Urinalysis significant in that she has no  proteinuria by dipstick, only 0-2 RBC's so acute PSGN unlikely (and time course too short anyway)  ATN - probably hemodynamically mediated (vol depletion, ACE, probable low BP as outpt based on symptoms) most likely etiology. Renal ultrasound shows normal sized, non-echogenic kidneys without hydronephrosis.  On a positive note is non-oliguric. Will continue IVF, slow rate to 50/hour, continue to follow.    Keean Wilmeth B,MD 08/17/2014 12:23 PM

## 2014-08-18 LAB — RENAL FUNCTION PANEL
ALBUMIN: 3.1 g/dL — AB (ref 3.5–5.2)
Anion gap: 16 — ABNORMAL HIGH (ref 5–15)
BUN: 74 mg/dL — ABNORMAL HIGH (ref 6–23)
CALCIUM: 9.1 mg/dL (ref 8.4–10.5)
CHLORIDE: 100 meq/L (ref 96–112)
CO2: 32 mEq/L (ref 19–32)
CREATININE: 6.87 mg/dL — AB (ref 0.50–1.10)
GFR calc Af Amer: 6 mL/min — ABNORMAL LOW (ref >90)
GFR, EST NON AFRICAN AMERICAN: 6 mL/min — AB (ref >90)
Glucose, Bld: 119 mg/dL — ABNORMAL HIGH (ref 70–99)
Phosphorus: 5.3 mg/dL — ABNORMAL HIGH (ref 2.3–4.6)
Potassium: 4.3 mEq/L (ref 3.7–5.3)
Sodium: 148 mEq/L — ABNORMAL HIGH (ref 137–147)

## 2014-08-18 LAB — GLUCOSE, CAPILLARY
GLUCOSE-CAPILLARY: 112 mg/dL — AB (ref 70–99)
GLUCOSE-CAPILLARY: 238 mg/dL — AB (ref 70–99)
GLUCOSE-CAPILLARY: 271 mg/dL — AB (ref 70–99)
Glucose-Capillary: 243 mg/dL — ABNORMAL HIGH (ref 70–99)

## 2014-08-18 MED ORDER — EZETIMIBE 10 MG PO TABS
10.0000 mg | ORAL_TABLET | Freq: Every day | ORAL | Status: DC
  Administered 2014-08-18 – 2014-08-20 (×3): 10 mg via ORAL
  Filled 2014-08-18 (×3): qty 1

## 2014-08-18 MED ORDER — IPRATROPIUM-ALBUTEROL 0.5-2.5 (3) MG/3ML IN SOLN: 3.0000 mL | RESPIRATORY_TRACT | Status: DC | PRN

## 2014-08-18 MED ORDER — IPRATROPIUM-ALBUTEROL 0.5-2.5 (3) MG/3ML IN SOLN: 3.0000 mL | Freq: Four times a day (QID) | RESPIRATORY_TRACT | Status: DC

## 2014-08-18 MED ORDER — IPRATROPIUM-ALBUTEROL 0.5-2.5 (3) MG/3ML IN SOLN: 3.0000 mL | RESPIRATORY_TRACT | Status: DC

## 2014-08-18 MED ORDER — ATORVASTATIN CALCIUM 80 MG PO TABS
80.0000 mg | ORAL_TABLET | Freq: Every day | ORAL | Status: DC
  Administered 2014-08-18 – 2014-08-20 (×3): 80 mg via ORAL
  Filled 2014-08-18 (×3): qty 1

## 2014-08-18 MED ORDER — IPRATROPIUM-ALBUTEROL 0.5-2.5 (3) MG/3ML IN SOLN
3.0000 mL | Freq: Two times a day (BID) | RESPIRATORY_TRACT | Status: DC
  Administered 2014-08-18 – 2014-08-20 (×5): 3 mL via RESPIRATORY_TRACT
  Filled 2014-08-18 (×4): qty 3

## 2014-08-18 NOTE — Progress Notes (Signed)
Subjective: Patient seen at bedside this AM. No significant complaints today. Denies SOB, chest pain, nausea, vomiting, or diarrhea. Good appetite, slept well overnight. C. Diff positive, started on Flagyl yesterday.  ~5L urine output yesterday. Cr improved to 6.87 this AM.  HCO3 32.   Objective:  Medications: Infusions: .  sodium bicarbonate 150 mEq in sterile water 1000 mL infusion 50 mL/hr at 08/18/14 0430   Scheduled Medications: . ALPRAZolam  0.5 mg Oral QHS  . aspirin EC  81 mg Oral Daily  . clopidogrel  75 mg Oral Daily  . heparin  5,000 Units Subcutaneous 3 times per day  . insulin aspart  0-9 Units Subcutaneous TID WC  . isosorbide mononitrate  30 mg Oral Daily  . levothyroxine  50 mcg Oral QAC breakfast  . metoprolol succinate  50 mg Oral Daily  . metroNIDAZOLE  500 mg Oral 3 times per day   Continuous Infusions: .  sodium bicarbonate 150 mEq in sterile water 1000 mL infusion 50 mL/hr at 08/18/14 0430   PRN Meds:.acetaminophen, acetaminophen, ipratropium-albuterol, ondansetron (ZOFRAN) IV, ondansetron, traMADol  BP 144/62  Pulse 67  Temp(Src) 98.5 F (36.9 C) (Oral)  Resp 19  Ht 5\' 1"  (1.549 m)  Wt 173 lb 15.1 oz (78.9 kg)  BMI 32.88 kg/m2  SpO2 96%   Intake/Output Summary (Last 24 hours) at 08/18/14 [id number] Last data filed at 08/18/14 0430  Gross per 24 hour  Intake   1140 ml  Output   5600 ml  Net  -4460 ml   Weight Change: 08/17/14 2138 173 lb 15.1 oz (78.9 kg)  08/16/14 2124 176 lb 2.4 oz (79.9 kg)  EXAM:  General: Centrally obese female, alert, cooperative, NAD. HEENT: PERRL, EOMI. Moist mucus membranes Neck: Full range of motion without pain, supple, no lymphadenopathy or carotid bruits. No JVD. Lungs: Air entry clear bilaterally, slightly decreased breath sounds. No crackles or wheezes present.  Heart: RRR, no murmurs, gallops, or rubs Abdomen: Soft, non-tender, non-distended, BS +  Extremities: No cyanosis, clubbing, or edema Neurologic: Alert  & oriented X3, cranial nerves II-XII intact, strength grossly intact, sensation intact to light touch   Labs: Basic Metabolic Panel:  Recent Labs Lab 08/16/14 2013 08/17/14 0532  NA 139 139  K 5.5* 4.1  CL 100 97  CO2 17* 22  GLUCOSE 64* 87  BUN 81* 83*  CREATININE 7.94* 7.99*  CALCIUM 8.6 8.3*  PHOS 6.9*  --     Liver Function Tests:  Recent Labs Lab 08/16/14 2013  AST 18  ALT 10  ALKPHOS 59  BILITOT <0.2*  PROT 6.1  ALBUMIN 2.8*  2.8*    CBC:  Recent Labs Lab 08/16/14 2013  WBC 9.6  NEUTROABS 6.2  HGB 10.6*  HCT 33.7*  MCV 86.4  PLT 263    Cardiac Enzymes:  Recent Labs Lab 08/16/14 2013  CKTOTAL 126    CBG:  Recent Labs Lab 08/17/14 0758 08/17/14 0833 08/17/14 1202 08/17/14 1708 08/17/14 2137  GLUCAP 70* 226* 247* 168* 133*    Assessment/Plan: 68 y.o. female w/ PMHx of HTN, CHF, CAD, HLD, Asthma, and DM type II, admitted for acute renal failure.  Acute Renal Failure- Patient w/ Cr of 8.20 at [facility] Baseline Cr ~1.0 according to previous notes and labs. AKI most likely 2/2 dehydration and volume depletion with ACE inhibitor on board in the setting of C. Diff Colitis. FeNa 22.84% on admission, suggesting ATN. Renal US unremarkable. UA w/out protein. Urine output ~5L overnight, Cr  significantly improved to 6.87 from 7.99. Mild Hyperkalemia- Resolved. AG Metabolic acidosis- According to [facility] ED notes, HCO3 of 15, AG of 27. On admission, HCO3 17 w/ AG of 22. On IVF IVF w/ NaHCO3 @ 75/hr. HCO3 32 this morning w/ AG of 16. Will discontinue bicarb gtt.  C. Diff Colitis- 2/2 recent Amoxicillin use in the setting of a strep pharyngitis infection. Started on Flagyl po yesterday (08/18/14).  Signed: Luanne Bras, MD 08/18/2014 6:52 AM  I have seen and examined this patient and agree with assessment and recommendations in the above note by Dr. Ronnald Ramp with highlighted addition.  Urine output is spontaneously improving (5 liters/24  hours, no diuretics)and creatinine is starting to fall.  She should continue to recover. Hyperkalemia resolved, acidosis markedly better. Stop IVF/bicarb. Continue to follow UOP and labs. Anticipate continued renal recovery.  +CDiff now on flagyl.  Anayansi Rundquist B,MD 08/18/2014 12:56 PM

## 2014-08-18 NOTE — Progress Notes (Signed)
[facility] PROGRESS NOTE  Tonya Richard [id number] DOB: [birth year] DOA: 08/16/2014 PCP: Wende Neighbors, MD  Summary 76 female transferred from [facility] ED with acute renal failure, creatinine 8 after a bout of vomiting diarrhea while on amoxicillin for streph pharyngitis.  Baseline creat 1. c diff positive.  Assessment/Plan:  Principal Problem:   Acute renal failure: improving. Off bicarb gtt per renal Active Problems: c diff colitis: on flagyl. No further n/v/d   DM type 2 (diabetes mellitus, type 2) with hypoglycemia: no further lows. Continue SSI for now   CAD (coronary artery disease), native coronary artery   Strep pharyngitis: amoxicillin stopped   Hypothyroidism:  TSH at [facility]   GERD (gastroesophageal reflux disease) PPI held due to above   Hyperkalemia, mild: resolved.   Essential hypertension, benign   Asthma, chronic   Metabolic acidosis: corrected   Morbid obesity  Code Status:  full Family Communication:  Daughter at bedside Disposition Plan:    Consultants:  nephrology  Procedures:     Antibiotics:  Flagyl 9/19 -  HPI/Subjective: Feels fine. No n/v/d. tol diet. C/o tired.  Objective: Filed Vitals:   08/18/14 0903  BP: 114/81  Pulse: 77  Temp: 97.5 F (36.4 C)  Resp: 18    Intake/Output Summary (Last 24 hours) at 08/18/14 1421 Last data filed at 08/18/14 1333  Gross per 24 hour  Intake    840 ml  Output   4600 ml  Net  -3760 ml   Filed Weights   08/16/14 1444 08/16/14 2124 08/17/14 2138  Weight: 79.9 kg (176 lb 2.4 oz) 79.9 kg (176 lb 2.4 oz) 78.9 kg (173 lb 15.1 oz)    Exam:   General:  Comfortable. Watching TV  cardiovascular: RRR without MGR  Respiratory: CTA without WRR  Abdomen: obese, s, nt, nd  Ext: no CCE  Basic Metabolic Panel:  Recent Labs Lab 08/16/14 2013 08/17/14 0532 08/18/14 0815  NA 139 139 148*  K 5.5* 4.1 4.3  CL 100 97 100  CO2 17* 22 32  GLUCOSE 64* 87 119*  BUN 81* 83* 74*   CREATININE 7.94* 7.99* 6.87*  CALCIUM 8.6 8.3* 9.1  PHOS 6.9*  --  5.3*   Liver Function Tests:  Recent Labs Lab 08/16/14 2013 08/18/14 0815  AST 18  --   ALT 10  --   ALKPHOS 59  --   BILITOT <0.2*  --   PROT 6.1  --   ALBUMIN 2.8*  2.8* 3.1*   No results found for this basename: LIPASE, AMYLASE,  in the last 168 hours No results found for this basename: AMMONIA,  in the last 168 hours CBC:  Recent Labs Lab 08/16/14 2013  WBC 9.6  NEUTROABS 6.2  HGB 10.6*  HCT 33.7*  MCV 86.4  PLT 263   Cardiac Enzymes:  Recent Labs Lab 08/16/14 2013  CKTOTAL 126   BNP (last 3 results) No results found for this basename: PROBNP,  in the last 8760 hours CBG:  Recent Labs Lab 08/17/14 1202 08/17/14 1708 08/17/14 2137 08/18/14 0727 08/18/14 1216  GLUCAP 247* 168* 133* 112* 243*    Recent Results (from the past 240 hour(s))  CLOSTRIDIUM DIFFICILE BY PCR     Status: Abnormal   Collection Time    08/16/14 10:30 PM      Result Value Ref Range Status   C difficile by pcr POSITIVE (*) NEGATIVE Final   Comment: CRITICAL RESULT CALLED TO, READ BACK BY AND VERIFIED WITH:  MINTZ B.,RN 08/17/14 1007 BY JONESJ     Studies: US Renal  08/17/2014   CLINICAL DATA:  Acute renal failure.  Rule out obstruction.  EXAM: RENAL/URINARY TRACT ULTRASOUND COMPLETE  COMPARISON:  CT abdomen and pelvis 08/16/2014  FINDINGS: Right Kidney:  Length: 11.8 cm. Echogenicity within normal limits. No mass or hydronephrosis visualized.  Left Kidney:  Length: 12.0 cm. Echogenicity within normal limits. No mass or hydronephrosis visualized.  Bladder:  Appears normal for degree of bladder distention.  IMPRESSION: Unremarkable renal ultrasound.  No hydronephrosis.   Electronically Signed   By: Logan Bores   On: 08/17/2014 10:26    Scheduled Meds: . ALPRAZolam  0.5 mg Oral QHS  . aspirin EC  81 mg Oral Daily  . clopidogrel  75 mg Oral Daily  . heparin  5,000 Units Subcutaneous 3 times per day  .  insulin aspart  0-9 Units Subcutaneous TID WC  . ipratropium-albuterol  3 mL Nebulization BID  . isosorbide mononitrate  30 mg Oral Daily  . levothyroxine  50 mcg Oral QAC breakfast  . metoprolol succinate  50 mg Oral Daily  . metroNIDAZOLE  500 mg Oral 3 times per day   Continuous Infusions:    Time spent: 25 minutes  [facility] Pager [phone]. If 7PM-7AM, please contact night-coverage at [email or web address], password [facility] 08/18/2014, 2:21 PM  LOS: 2 days

## 2014-08-19 LAB — GLUCOSE, CAPILLARY
GLUCOSE-CAPILLARY: 257 mg/dL — AB (ref 70–99)
Glucose-Capillary: 285 mg/dL — ABNORMAL HIGH (ref 70–99)
Glucose-Capillary: 340 mg/dL — ABNORMAL HIGH (ref 70–99)
Glucose-Capillary: 227 mg/dL — ABNORMAL HIGH (ref 70–99)

## 2014-08-19 LAB — RENAL FUNCTION PANEL
Albumin: 3.4 g/dL — ABNORMAL LOW (ref 3.5–5.2)
Anion gap: 16 — ABNORMAL HIGH (ref 5–15)
BUN: 64 mg/dL — AB (ref 6–23)
CHLORIDE: 100 meq/L (ref 96–112)
CO2: 25 meq/L (ref 19–32)
CREATININE: 4.96 mg/dL — AB (ref 0.50–1.10)
Calcium: 9.5 mg/dL (ref 8.4–10.5)
GFR calc Af Amer: 10 mL/min — ABNORMAL LOW (ref >90)
GFR calc non Af Amer: 8 mL/min — ABNORMAL LOW (ref >90)
Glucose, Bld: 285 mg/dL — ABNORMAL HIGH (ref 70–99)
Phosphorus: 4.1 mg/dL (ref 2.3–4.6)
Potassium: 4.4 mEq/L (ref 3.7–5.3)
Sodium: 141 mEq/L (ref 137–147)

## 2014-08-19 MED ORDER — INSULIN NPH (HUMAN) (ISOPHANE) 100 UNIT/ML ~~LOC~~ SUSP
15.0000 [IU] | Freq: Two times a day (BID) | SUBCUTANEOUS | Status: DC
  Administered 2014-08-19 – 2014-08-20 (×2): 15 [IU] via SUBCUTANEOUS
  Filled 2014-08-19: qty 10

## 2014-08-19 NOTE — Progress Notes (Signed)
Inpatient Diabetes Program Recommendations  AACE/ADA: New Consensus Statement on Inpatient Glycemic Control (2013)  Target Ranges:  Prepandial:   less than 140 mg/dL      Peak postprandial:   less than 180 mg/dL (1-2 hours)      Critically ill patients:  140 - 180 mg/dL     Results for CARLISE, FLORIO (MRN [id number]) as of 08/19/2014 09:47  Ref. Range 08/18/2014 07:27 08/18/2014 12:16 08/18/2014 17:33 08/18/2014 22:07  Glucose-Capillary Latest Range: 70-99 mg/dL 112 (H) 243 (H) 238 (H) 271 (H)    Results for VIDETTE, WILKINSON (MRN [id number]) as of 08/19/2014 09:47  Ref. Range 08/19/2014 07:44  Glucose-Capillary Latest Range: 70-99 mg/dL 285 (H)     Home DM Meds: NPH insulin- 60 units bid Amaryl 2 mg QID?? Metformin 1000 mg bid    Patient eating 100% of meals.  Having sustained glucose elevations.   MD- Please consider starting 25% of patient's home dose of NPH insulin- NPH 15 units bid (breakfast and bedtime)     Will follow Wyn Quaker RN, MSN, CDE Diabetes Coordinator Inpatient Diabetes Program Team Pager: [phone] (8a-10p)

## 2014-08-19 NOTE — Progress Notes (Signed)
I saw the patient and agree with the above assessment and plan.     Pt w/ clear evidence of renal recovery.  Excellent UOP, minimal GI losses.  Tolerating PO well. Suggest follow for 24h more, then likley can be followed as outpt.  No new issues

## 2014-08-19 NOTE — Progress Notes (Signed)
[facility] PROGRESS NOTE  Tonya Richard [id number] DOB: 05-09-1946 DOA: 08/16/2014 PCP: Wende Neighbors, MD  Summary 7 female transferred from [facility] ED with acute renal failure, creatinine 8 after a bout of vomiting diarrhea while on amoxicillin for streph pharyngitis.  Baseline creat 1. c diff positive.  Assessment/Plan:    Acute renal failure: improving. Off bicarb gtt per renal -trend -appreciate renal consult  c diff colitis: on flagyl. No further n/v/d    DM type 2 (diabetes mellitus, type 2) with hypoglycemia: no further lows. Continue SSI for now -add back part of home long acting    CAD (coronary artery disease), native coronary artery   Strep pharyngitis: amoxicillin stopped   Hypothyroidism:  TSH at [facility] 1   GERD (gastroesophageal reflux disease) PPI held due to above   Hyperkalemia, mild: resolved.   Essential hypertension, benign   Asthma, chronic   Metabolic acidosis: corrected   Morbid obesity  Code Status:  full Family Communication:  Daughter at bedside Disposition Plan:    Consultants:  nephrology  Procedures:     Antibiotics:  Flagyl 9/19 -  HPI/Subjective: Eating well No complaints of  Objective: Filed Vitals:   08/19/14 0934  BP: 117/63  Pulse: 86  Temp: 98.1 F (36.7 C)  Resp: 18    Intake/Output Summary (Last 24 hours) at 08/19/14 1015 Last data filed at 08/19/14 0900  Gross per 24 hour  Intake   1560 ml  Output   5100 ml  Net  -3540 ml   Filed Weights   08/16/14 2124 08/17/14 2138 08/18/14 2208  Weight: 79.9 kg (176 lb 2.4 oz) 78.9 kg (173 lb 15.1 oz) 79 kg (174 lb 2.6 oz)    Exam:   General:  Comfortable. NAD  cardiovascular: RRR without MGR  Respiratory: CTA without WRR  Abdomen: obese, s, nt, nd  Ext: no CCE  Basic Metabolic Panel:  Recent Labs Lab 08/16/14 2013 08/17/14 0532 08/18/14 0815 08/19/14 0451  NA 139 139 148* 141  K 5.5* 4.1 4.3 4.4  CL 100 97 100 100  CO2 17* 22 32 25   GLUCOSE 64* 87 119* 285*  BUN 81* 83* 74* 64*  CREATININE 7.94* 7.99* 6.87* 4.96*  CALCIUM 8.6 8.3* 9.1 9.5  PHOS 6.9*  --  5.3* 4.1   Liver Function Tests:  Recent Labs Lab 08/16/14 2013 08/18/14 0815 08/19/14 0451  AST 18  --   --   ALT 10  --   --   ALKPHOS 59  --   --   BILITOT <0.2*  --   --   PROT 6.1  --   --   ALBUMIN 2.8*  2.8* 3.1* 3.4*   No results found for this basename: LIPASE, AMYLASE,  in the last 168 hours No results found for this basename: AMMONIA,  in the last 168 hours CBC:  Recent Labs Lab 08/16/14 2013  WBC 9.6  NEUTROABS 6.2  HGB 10.6*  HCT 33.7*  MCV 86.4  PLT 263   Cardiac Enzymes:  Recent Labs Lab 08/16/14 2013  CKTOTAL 126   BNP (last 3 results) No results found for this basename: PROBNP,  in the last 8760 hours CBG:  Recent Labs Lab 08/18/14 0727 08/18/14 1216 08/18/14 1733 08/18/14 2207 08/19/14 0744  GLUCAP 112* 243* 238* 271* 285*    Recent Results (from the past 240 hour(s))  CLOSTRIDIUM DIFFICILE BY PCR     Status: Abnormal   Collection Time    08/16/14 10:30  PM      Result Value Ref Range Status   C difficile by pcr POSITIVE (*) NEGATIVE Final   Comment: CRITICAL RESULT CALLED TO, READ BACK BY AND VERIFIED WITH:     MINTZ B.,RN 08/17/14 1007 BY JONESJ     Studies: No results found.  Scheduled Meds: . ALPRAZolam  0.5 mg Oral QHS  . aspirin EC  81 mg Oral Daily  . atorvastatin  80 mg Oral Daily  . clopidogrel  75 mg Oral Daily  . ezetimibe  10 mg Oral Daily  . heparin  5,000 Units Subcutaneous 3 times per day  . insulin aspart  0-9 Units Subcutaneous TID WC  . ipratropium-albuterol  3 mL Nebulization BID  . isosorbide mononitrate  30 mg Oral Daily  . levothyroxine  50 mcg Oral QAC breakfast  . metoprolol succinate  50 mg Oral Daily  . metroNIDAZOLE  500 mg Oral 3 times per day   Continuous Infusions:    Time spent: 25 minutes  Tonya Richard  [facility] Pager [phone]. If 7PM-7AM,  please contact night-coverage at [email or web address], password [facility] 08/19/2014, 10:15 AM  LOS: 3 days

## 2014-08-19 NOTE — Plan of Care (Signed)
Problem: Phase II Progression Outcomes Goal: Hemodynamically stable Outcome: Completed/Met Date Met:  08/19/14 Patient is alert and oriented. She is ambulatory around her room. Vital signs within defined limits.     

## 2014-08-19 NOTE — Progress Notes (Signed)
Subjective: Patient seen at bedside this AM. No new complaints. Slept well overnight.  Cr continues to improve, 4.96 this AM. 5.5L urine output over past 24 hours.  Objective:  Medications: Infusions:   Scheduled Medications: . ALPRAZolam  0.5 mg Oral QHS  . aspirin EC  81 mg Oral Daily  . atorvastatin  80 mg Oral Daily  . clopidogrel  75 mg Oral Daily  . ezetimibe  10 mg Oral Daily  . heparin  5,000 Units Subcutaneous 3 times per day  . insulin aspart  0-9 Units Subcutaneous TID WC  . ipratropium-albuterol  3 mL Nebulization BID  . isosorbide mononitrate  30 mg Oral Daily  . levothyroxine  50 mcg Oral QAC breakfast  . metoprolol succinate  50 mg Oral Daily  . metroNIDAZOLE  500 mg Oral 3 times per day     PRN Meds:.acetaminophen, acetaminophen, ipratropium-albuterol, ondansetron (ZOFRAN) IV, ondansetron, traMADol  BP 139/70  Pulse 73  Temp(Src) 98.3 F (36.8 C) (Oral)  Resp 18  Ht 5\' 1"  (1.549 m)  Wt 174 lb 2.6 oz (79 kg)  BMI 32.92 kg/m2  SpO2 96%   Intake/Output Summary (Last 24 hours) at 08/19/14 0645 Last data filed at 08/19/14 0533  Gross per 24 hour  Intake   1800 ml  Output   5500 ml  Net  -3700 ml   Weight Change: 08/18/14 2208 174 lb 2.6 oz (79 kg) 08/17/14 2138 173 lb 15.1 oz (78.9 kg)  08/16/14 2124 176 lb 2.4 oz (79.9 kg)  EXAM:  General: Centrally obese female, alert, cooperative, NAD. HEENT: PERRL, EOMI. Moist mucus membranes Neck: Full range of motion without pain, supple, no lymphadenopathy or carotid bruits. No JVD. Lungs: Air entry clear bilaterally, slightly decreased breath sounds. No crackles or wheezes present.  Heart: RRR, no murmurs, gallops, or rubs Abdomen: Soft, non-tender, non-distended, BS +  Extremities: No cyanosis, clubbing, or edema Neurologic: Alert & oriented X3, cranial nerves II-XII intact, strength grossly intact, sensation intact to light touch   Labs: Basic Metabolic Panel:  Recent Labs Lab 08/16/14 2013  08/17/14 0532 08/18/14 0815 08/19/14 0451  NA 139 139 148* 141  K 5.5* 4.1 4.3 4.4  CL 100 97 100 100  CO2 17* 22 32 25  GLUCOSE 64* 87 119* 285*  BUN 81* 83* 74* 64*  CREATININE 7.94* 7.99* 6.87* 4.96*  CALCIUM 8.6 8.3* 9.1 9.5  PHOS 6.9*  --  5.3* 4.1    Liver Function Tests:  Recent Labs Lab 08/16/14 2013 08/18/14 0815 08/19/14 0451  AST 18  --   --   ALT 10  --   --   ALKPHOS 59  --   --   BILITOT <0.2*  --   --   PROT 6.1  --   --   ALBUMIN 2.8*  2.8* 3.1* 3.4*    CBC:  Recent Labs Lab 08/16/14 2013  WBC 9.6  NEUTROABS 6.2  HGB 10.6*  HCT 33.7*  MCV 86.4  PLT 263    Cardiac Enzymes:  Recent Labs Lab 08/16/14 2013  CKTOTAL 126    CBG:  Recent Labs Lab 08/17/14 2137 08/18/14 0727 08/18/14 1216 08/18/14 1733 08/18/14 2207  GLUCAP 133* 112* 243* 238* 271*    Assessment/Plan: 69 y.o. female w/ PMHx of HTN, CHF, CAD, HLD, Asthma, and DM type II, admitted for acute renal failure.  Acute Renal Failure- Patient w/ Cr of 8.20 at [facility]. Baseline Cr ~1.0 according to previous notes and labs. AKI  most likely 2/2 dehydration and volume depletion (also on Ramipril) in the setting of C. Diff Colitis. FeNa 22.84% on admission, suggesting ATN. Renal US unremarkable. UA w/out protein. Urine output 5.5L overnight, Cr still improving, now 4.96. Mild Hyperkalemia- Resolved. K 4.4 this AM.  AG Metabolic acidosis- Resolved acidosis, HCO3 now 25. AG 16. HCO3 gtt discontinued yesterday.  C. Diff Colitis- 2/2 recent Amoxicillin use in the setting of a strep pharyngitis infection. Started on Flagyl po 08/18/14.  Signed: Luanne Bras, MD 08/19/2014 6:45 AM

## 2014-08-20 LAB — CBC
HCT: 37.4 % (ref 36.0–46.0)
Hemoglobin: 11.9 g/dL — ABNORMAL LOW (ref 12.0–15.0)
MCH: 26.8 pg (ref 26.0–34.0)
MCHC: 31.8 g/dL (ref 30.0–36.0)
MCV: 84.2 fL (ref 78.0–100.0)
PLATELETS: 281 10*3/uL (ref 150–400)
RBC: 4.44 MIL/uL (ref 3.87–5.11)
RDW: 14.3 % (ref 11.5–15.5)
WBC: 11.1 10*3/uL — ABNORMAL HIGH (ref 4.0–10.5)

## 2014-08-20 LAB — BASIC METABOLIC PANEL
ANION GAP: 17 — AB (ref 5–15)
BUN: 49 mg/dL — ABNORMAL HIGH (ref 6–23)
CALCIUM: 10.4 mg/dL (ref 8.4–10.5)
CO2: 23 mEq/L (ref 19–32)
Chloride: 101 mEq/L (ref 96–112)
Creatinine, Ser: 3.46 mg/dL — ABNORMAL HIGH (ref 0.50–1.10)
GFR calc Af Amer: 15 mL/min — ABNORMAL LOW (ref >90)
GFR, EST NON AFRICAN AMERICAN: 13 mL/min — AB (ref >90)
GLUCOSE: 171 mg/dL — AB (ref 70–99)
Potassium: 4.9 mEq/L (ref 3.7–5.3)
Sodium: 141 mEq/L (ref 137–147)

## 2014-08-20 LAB — GLUCOSE, CAPILLARY
Glucose-Capillary: 191 mg/dL — ABNORMAL HIGH (ref 70–99)
Glucose-Capillary: 341 mg/dL — ABNORMAL HIGH (ref 70–99)

## 2014-08-20 MED ORDER — POLYETHYLENE GLYCOL 3350 17 G PO PACK
17.0000 g | PACK | Freq: Every day | ORAL | Status: DC
  Administered 2014-08-20: 17 g via ORAL
  Filled 2014-08-20: qty 1

## 2014-08-20 MED ORDER — METRONIDAZOLE 500 MG PO TABS: 500.0000 mg | ORAL_TABLET | Freq: Three times a day (TID) | ORAL | Status: DC

## 2014-08-20 MED ORDER — INSULIN NPH (HUMAN) (ISOPHANE) 100 UNIT/ML ~~LOC~~ SUSP: 30.0000 [IU] | Freq: Two times a day (BID) | SUBCUTANEOUS | Status: DC

## 2014-08-20 NOTE — Progress Notes (Signed)
Patient Discharge: Disposition:  Patient discharged to home accompanied by daughter.  Education: Patient and daughter educated about the follow -up appointments, prescriptions, medications, diet, given a easy to read hand out on renal failure.  Both understood and acknowledged.  IV: Removed IV before discharge. Transportation: Patient transported in w/c with staff and daughter accompanying. Belongings: Patient took all her belongings with her.

## 2014-08-20 NOTE — Care Management Note (Signed)
CARE MANAGEMENT NOTE 08/20/2014  Patient:  Tonya Richard, Tonya Richard   Account Number:  [id number]  Date Initiated:  08/20/2014  Documentation initiated by:  Zandyr Barnhill  Subjective/Objective Assessment:   CM following for progressing  and d/c planning.     Action/Plan:   Met with pt and daughter, no d/c needs identified. Daughter has given Medicare info to Development worker, community. Pt has cane and uses as needed.   Anticipated DC Date:  08/20/2014   Anticipated DC Plan:  HOME/SELF CARE         Choice offered to / List presented to:             Status of service:   Medicare Important Message given?  YES (If response is "NO", the following Medicare IM given date fields will be blank) Date Medicare IM given:  08/20/2014 Medicare IM given by:  Sreekar Broyhill Date Additional Medicare IM given:   Additional Medicare IM given by:    Discharge Disposition:  HOME/SELF CARE  Per UR Regulation:    If discussed at Long Length of Stay Meetings, dates discussed:    Comments:  08/20/2014 Pt for d/c today no HH needs identified.   Jasmine Pang RN MPH, case manager, [phone]

## 2014-08-20 NOTE — Progress Notes (Signed)
I saw the patient and agree with the above assessment and plan.    Patient with continued rapid improvement in renal function. She is tolerating intake by mouth only. She is having no diarrhea whatsoever. Electrolytes are stable. We will sign off for now. Upon discharge she should followup with her primary care physician for evaluation of renal function and if it remains abnormal we are happy to see her in the clinic. At least for the short term I don't think that she should be on an ACE inhibitor.

## 2014-08-20 NOTE — Discharge Summary (Signed)
Physician Discharge Summary  Tonya Richard [id number] DOB: 11-11-1946 DOA: 08/16/2014  PCP: Tonya Neighbors, MD  Admit date: 08/16/2014 Discharge date: 08/20/2014  Time spent: 35 minutes  Recommendations for Outpatient Follow-up:  1. BMP Friday- monitor periodically  Discharge Diagnoses:  Principal Problem:   Acute renal failure Active Problems:   DM type 2 (diabetes mellitus, type 2)   CAD (coronary artery disease), native coronary artery   Vomiting and diarrhea   Strep pharyngitis   Hypothyroidism   GERD (gastroesophageal reflux disease)   Hypoglycemia   Hyperkalemia, mild   Essential hypertension, benign   Asthma, chronic   Metabolic acidosis   Morbid obesity   Enteritis due to Clostridium difficile   Discharge Condition: improved  Diet recommendation: diabetic/cardiac  Filed Weights   08/17/14 2138 08/18/14 2208 08/19/14 2146  Weight: 78.9 kg (173 lb 15.1 oz) 79 kg (174 lb 2.6 oz) 79.4 kg (175 lb 0.7 oz)    History of present illness:  Tonya Richard is a 68 y.o. female  With history of heart disease, diabetes type 2, hypothyroidism, hypertension, hyperlipidemia and recent strep pharyngitis who was transferred to [facility] from [place] emergency room with acute renal failure. She had labs drawn and are primary care provider's office yesterday and was told to come to the emergency room. In the office, his BUN was 60, creatinine 6.47, potassium 5.3, bicarbonate 18. Today in the emergency room, BUN was 82 creatinine 8.2, potassium 5.2, bicarbonate 15, glucose 50. Transferred to [facility] as Tonya Richard has no nephrologist. CT abdomen pelvis without contrast shows normal kidneys without hydronephrosis. Takes ibuprofen occasionally, about once a week. Is on ramipril, metformin, Lasix, as well as multiple other medications. In the emergency room, she got several amps of D50, 2 L of saline and 4 mg of IV Zofran  Patient has been on amoxicillin for about a week  for the strep pharyngitis. Over the past few days, she has had periods of severe vomiting and diarrhea, no hematemesis or melena. No definite fevers or chills. No abdominal pain.   [facility] Course:  Acute renal failure: improving.  -trend  -renal function monitoring and if it remains abnormal can follow with Tonya Richard  c diff colitis: on flagyl for `14 days. No further n/v/d   DM type 2 (diabetes mellitus, type 2) with hypoglycemia: no further lows. Continue SSI for now  -add back part of home long acting   CAD (coronary artery disease), native coronary artery  Strep pharyngitis: amoxicillin stopped  Hypothyroidism: TSH at Tonya Richard 1  GERD (gastroesophageal reflux disease) PPI held due to above  Hyperkalemia, mild: resolved.  Essential hypertension, benign  Asthma, chronic  Metabolic acidosis: corrected  Morbid obesity   Procedures:    Consultations:  renal  Discharge Exam: Filed Vitals:   08/20/14 1015  BP: 155/59  Pulse: 89  Temp: 98.5 F (36.9 C)  Resp: 19    General: A+Ox3, NAD Cardiovascular: rrr Respiratory: clear  Discharge Instructions You were cared for by a hospitalist during your [facility] stay. If you have any questions about your discharge medications or the care you received while you were in the [facility] after you are discharged, you can call the unit and asked to speak with the hospitalist on call if the hospitalist that took care of you is not available. Once you are discharged, your primary care physician will handle any further medical issues. Please note that NO REFILLS for any discharge medications will be authorized once you  are discharged, as it is imperative that you return to your primary care physician (or establish a relationship with a primary care physician if you do not have one) for your aftercare needs so that they can reassess your need for medications and monitor your lab values.  Discharge Instructions   Diet - low sodium heart  healthy    Complete by:  As directed      Diet Carb Modified    Complete by:  As directed      Discharge instructions    Complete by:  As directed   BMP Friday- if still elevated, may follow in renal clinic with Tonya Richard     Increase activity slowly    Complete by:  As directed           Current Discharge Medication List    START taking these medications   Details  metroNIDAZOLE (FLAGYL) 500 MG tablet Take 1 tablet (500 mg total) by mouth every 8 (eight) hours. Qty: 42 tablet, Refills: 0      CONTINUE these medications which have CHANGED   Details  insulin NPH Human (HUMULIN N,NOVOLIN N) 100 UNIT/ML injection Inject 0.3 mLs (30 Units total) into the skin 2 (two) times daily at 8 am and 10 pm. Qty: 10 mL, Refills: 11      CONTINUE these medications which have NOT CHANGED   Details  ALPRAZolam (XANAX) 0.5 MG tablet Take 0.5 mg by mouth at bedtime.    aspirin EC 81 MG tablet Take 81 mg by mouth daily.    atorvastatin (LIPITOR) 80 MG tablet Take 80 mg by mouth daily.    clopidogrel (PLAVIX) 75 MG tablet Take 75 mg by mouth daily.    ezetimibe (ZETIA) 10 MG tablet Take 10 mg by mouth daily.    ipratropium-albuterol (DUONEB) 0.5-2.5 (3) MG/3ML SOLN Take 3 mLs by nebulization 4 (four) times daily as needed (shortness of breath/wheezing).    isosorbide mononitrate (IMDUR) 30 MG 24 hr tablet Take 30 mg by mouth daily.    levothyroxine (SYNTHROID, LEVOTHROID) 50 MCG tablet Take 50 mcg by mouth daily before breakfast.    metoprolol succinate (TOPROL-XL) 50 MG 24 hr tablet Take 50 mg by mouth daily. Take with or immediately following a meal.    pantoprazole (PROTONIX) 40 MG tablet Take 40 mg by mouth daily.      STOP taking these medications     amoxicillin (AMOXIL) 500 MG capsule      furosemide (LASIX) 40 MG tablet      glimepiride (AMARYL) 2 MG tablet      metFORMIN (GLUCOPHAGE) 500 MG tablet      ramipril (ALTACE) 10 MG capsule        No Known  Allergies    The results of significant diagnostics from this hospitalization (including imaging, microbiology, ancillary and laboratory) are listed below for reference.    Significant Diagnostic Studies: US Renal  08/17/2014   CLINICAL DATA:  Acute renal failure.  Rule out obstruction.  EXAM: RENAL/URINARY TRACT ULTRASOUND COMPLETE  COMPARISON:  CT abdomen and pelvis 08/16/2014  FINDINGS: Right Kidney:  Length: 11.8 cm. Echogenicity within normal limits. No mass or hydronephrosis visualized.  Left Kidney:  Length: 12.0 cm. Echogenicity within normal limits. No mass or hydronephrosis visualized.  Bladder:  Appears normal for degree of bladder distention.  IMPRESSION: Unremarkable renal ultrasound.  No hydronephrosis.   Electronically Signed   By: Logan Bores   On: 08/17/2014 10:26    Microbiology: Recent  Results (from the past 240 hour(s))  CLOSTRIDIUM DIFFICILE BY PCR     Status: Abnormal   Collection Time    08/16/14 10:30 PM      Result Value Ref Range Status   C difficile by pcr POSITIVE (*) NEGATIVE Final   Comment: CRITICAL RESULT CALLED TO, READ BACK BY AND VERIFIED WITH:     MINTZ B.,RN 08/17/14 1007 BY JONESJ     Labs: Basic Metabolic Panel:  Recent Labs Lab 08/16/14 2013 08/17/14 0532 08/18/14 0815 08/19/14 0451 08/20/14 0552  NA 139 139 148* 141 141  K 5.5* 4.1 4.3 4.4 4.9  CL 100 97 100 100 101  CO2 17* 22 32 25 23  GLUCOSE 64* 87 119* 285* 171*  BUN 81* 83* 74* 64* 49*  CREATININE 7.94* 7.99* 6.87* 4.96* 3.46*  CALCIUM 8.6 8.3* 9.1 9.5 10.4  PHOS 6.9*  --  5.3* 4.1  --    Liver Function Tests:  Recent Labs Lab 08/16/14 2013 08/18/14 0815 08/19/14 0451  AST 18  --   --   ALT 10  --   --   ALKPHOS 59  --   --   BILITOT <0.2*  --   --   PROT 6.1  --   --   ALBUMIN 2.8*  2.8* 3.1* 3.4*   No results found for this basename: LIPASE, AMYLASE,  in the last 168 hours No results found for this basename: AMMONIA,  in the last 168 hours CBC:  Recent  Labs Lab 08/16/14 2013 08/20/14 0552  WBC 9.6 11.1*  NEUTROABS 6.2  --   HGB 10.6* 11.9*  HCT 33.7* 37.4  MCV 86.4 84.2  PLT 263 281   Cardiac Enzymes:  Recent Labs Lab 08/16/14 2013  CKTOTAL 126   BNP: BNP (last 3 results) No results found for this basename: PROBNP,  in the last 8760 hours CBG:  Recent Labs Lab 08/19/14 0744 08/19/14 1141 08/19/14 1712 08/19/14 2143 08/20/14 0804  GLUCAP 285* 340* 257* 227* 191*       Signed:  VANN, JESSICA  [facility] 08/20/2014, 12:07 PM

## 2014-08-20 NOTE — Progress Notes (Signed)
Subjective: Patient seen at bedside this AM. No nausea, vomiting, or diarrhea. Says she has not had a bowel movement since admission.  Cr continues to improve, 3.46 this AM. 1.6L urine output over past 24 hours.  Objective:  Medications: Infusions:   Scheduled Medications: . ALPRAZolam  0.5 mg Oral QHS  . aspirin EC  81 mg Oral Daily  . atorvastatin  80 mg Oral Daily  . clopidogrel  75 mg Oral Daily  . ezetimibe  10 mg Oral Daily  . heparin  5,000 Units Subcutaneous 3 times per day  . insulin aspart  0-9 Units Subcutaneous TID WC  . insulin NPH Human  15 Units Subcutaneous BID AC & HS  . ipratropium-albuterol  3 mL Nebulization BID  . isosorbide mononitrate  30 mg Oral Daily  . levothyroxine  50 mcg Oral QAC breakfast  . metoprolol succinate  50 mg Oral Daily  . metroNIDAZOLE  500 mg Oral 3 times per day  . polyethylene glycol  17 g Oral Daily     PRN Meds:.acetaminophen, acetaminophen, ipratropium-albuterol, ondansetron (ZOFRAN) IV, ondansetron, traMADol  BP 128/79  Pulse 82  Temp(Src) 98.7 F (37.1 C) (Oral)  Resp 18  Ht 5\' 1"  (1.549 m)  Wt 175 lb 0.7 oz (79.4 kg)  BMI 33.09 kg/m2  SpO2 96%   Intake/Output Summary (Last 24 hours) at 08/20/14 0829 Last data filed at 08/19/14 1700  Gross per 24 hour  Intake   1080 ml  Output   1000 ml  Net     80 ml   Weight Change: 08/19/14 2146 175 lb 0.7 oz (79.4 kg) 08/18/14 2208 174 lb 2.6 oz (79 kg) 08/17/14 2138 173 lb 15.1 oz (78.9 kg)  08/16/14 2124 176 lb 2.4 oz (79.9 kg)  EXAM:  General: Centrally obese female, alert, cooperative, NAD. HEENT: PERRL, EOMI. Moist mucus membranes Neck: Full range of motion without pain, supple, no lymphadenopathy or carotid bruits. No JVD. Lungs: Air entry clear bilaterally, slightly decreased breath sounds. No crackles or wheezes present.  Heart: RRR, no murmurs, gallops, or rubs Abdomen: Soft, non-tender, mildly distended, BS +  Extremities: No cyanosis, clubbing, or  edema Neurologic: Alert & oriented X3, cranial nerves II-XII intact, strength grossly intact, sensation intact to light touch   Labs: Basic Metabolic Panel:  Recent Labs Lab 08/16/14 2013  08/18/14 0815 08/19/14 0451 08/20/14 0552  NA 139  < > 148* 141 141  K 5.5*  < > 4.3 4.4 4.9  CL 100  < > 100 100 101  CO2 17*  < > 32 25 23  GLUCOSE 64*  < > 119* 285* 171*  BUN 81*  < > 74* 64* 49*  CREATININE 7.94*  < > 6.87* 4.96* 3.46*  CALCIUM 8.6  < > 9.1 9.5 10.4  PHOS 6.9*  --  5.3* 4.1  --   < > = values in this interval not displayed.  Liver Function Tests:  Recent Labs Lab 08/16/14 2013 08/18/14 0815 08/19/14 0451  AST 18  --   --   ALT 10  --   --   ALKPHOS 59  --   --   BILITOT <0.2*  --   --   PROT 6.1  --   --   ALBUMIN 2.8*  2.8* 3.1* 3.4*    CBC:  Recent Labs Lab 08/16/14 2013 08/20/14 0552  WBC 9.6 11.1*  NEUTROABS 6.2  --   HGB 10.6* 11.9*  HCT 33.7* 37.4  MCV 86.4 84.2  PLT 263 281    Cardiac Enzymes:  Recent Labs Lab 08/16/14 2013  CKTOTAL 126    CBG:  Recent Labs Lab 08/19/14 0744 08/19/14 1141 08/19/14 1712 08/19/14 2143 08/20/14 0804  GLUCAP 285* 340* 257* 227* 191*    Assessment/Plan: 68 y.o. female w/ PMHx of HTN, CHF, CAD, HLD, Asthma, and DM type II, admitted for acute renal failure.  Acute Renal Failure- Patient w/ Cr of 8.20 at [facility]. Baseline Cr ~1.0 according to previous notes and labs. AKI most likely 2/2 dehydration and volume depletion (also on Ramipril) in the setting of C. Diff Colitis. FeNa 22.84% on admission, suggesting ATN. Renal US unremarkable. UA w/out protein. Good urine output overnight. Cr still improving, now 3.46. Given rapid improvement in Cr, suspect she will return to her baseline renal function. Suggest holding Ramipril on discharge until patient can follow up w/ her PCP.  Mild Hyperkalemia- Resolved.  AG Metabolic acidosis- Resolved acidosis.  C. Diff Colitis- 2/2 recent Amoxicillin use  in the setting of a strep pharyngitis infection. Started on Flagyl po 08/18/14.  Signed: Luanne Bras, MD 08/20/2014 8:29 AM

## 2014-12-03 DIAGNOSIS — E119 Type 2 diabetes mellitus without complications: Secondary | ICD-10-CM | POA: Diagnosis not present

## 2014-12-03 DIAGNOSIS — E785 Hyperlipidemia, unspecified: Secondary | ICD-10-CM | POA: Diagnosis not present

## 2014-12-03 DIAGNOSIS — I739 Peripheral vascular disease, unspecified: Secondary | ICD-10-CM | POA: Diagnosis not present

## 2014-12-03 DIAGNOSIS — I2510 Atherosclerotic heart disease of native coronary artery without angina pectoris: Secondary | ICD-10-CM | POA: Diagnosis not present

## 2014-12-03 DIAGNOSIS — I10 Essential (primary) hypertension: Secondary | ICD-10-CM | POA: Diagnosis not present

## 2015-01-10 DIAGNOSIS — H25811 Combined forms of age-related cataract, right eye: Secondary | ICD-10-CM | POA: Diagnosis not present

## 2015-01-10 DIAGNOSIS — E10329 Type 1 diabetes mellitus with mild nonproliferative diabetic retinopathy without macular edema: Secondary | ICD-10-CM | POA: Diagnosis not present

## 2015-02-04 DIAGNOSIS — E119 Type 2 diabetes mellitus without complications: Secondary | ICD-10-CM | POA: Diagnosis not present

## 2015-02-04 DIAGNOSIS — E039 Hypothyroidism, unspecified: Secondary | ICD-10-CM | POA: Diagnosis not present

## 2015-02-04 DIAGNOSIS — Z7982 Long term (current) use of aspirin: Secondary | ICD-10-CM | POA: Diagnosis not present

## 2015-02-04 DIAGNOSIS — Z87891 Personal history of nicotine dependence: Secondary | ICD-10-CM | POA: Diagnosis not present

## 2015-02-04 DIAGNOSIS — H259 Unspecified age-related cataract: Secondary | ICD-10-CM | POA: Diagnosis not present

## 2015-02-04 DIAGNOSIS — Z794 Long term (current) use of insulin: Secondary | ICD-10-CM | POA: Diagnosis not present

## 2015-02-04 DIAGNOSIS — E785 Hyperlipidemia, unspecified: Secondary | ICD-10-CM | POA: Diagnosis not present

## 2015-02-04 DIAGNOSIS — J449 Chronic obstructive pulmonary disease, unspecified: Secondary | ICD-10-CM | POA: Diagnosis not present

## 2015-02-04 DIAGNOSIS — Z79899 Other long term (current) drug therapy: Secondary | ICD-10-CM | POA: Diagnosis not present

## 2015-02-04 DIAGNOSIS — I2510 Atherosclerotic heart disease of native coronary artery without angina pectoris: Secondary | ICD-10-CM | POA: Diagnosis not present

## 2015-02-04 DIAGNOSIS — H25811 Combined forms of age-related cataract, right eye: Secondary | ICD-10-CM | POA: Diagnosis not present

## 2015-02-25 DIAGNOSIS — N189 Chronic kidney disease, unspecified: Secondary | ICD-10-CM | POA: Diagnosis not present

## 2015-02-25 DIAGNOSIS — E1165 Type 2 diabetes mellitus with hyperglycemia: Secondary | ICD-10-CM | POA: Diagnosis not present

## 2015-02-25 DIAGNOSIS — G459 Transient cerebral ischemic attack, unspecified: Secondary | ICD-10-CM | POA: Diagnosis not present

## 2015-02-25 DIAGNOSIS — K210 Gastro-esophageal reflux disease with esophagitis: Secondary | ICD-10-CM | POA: Diagnosis not present

## 2015-02-25 DIAGNOSIS — E038 Other specified hypothyroidism: Secondary | ICD-10-CM | POA: Diagnosis not present

## 2015-02-27 DIAGNOSIS — I509 Heart failure, unspecified: Secondary | ICD-10-CM | POA: Diagnosis not present

## 2015-02-27 DIAGNOSIS — K210 Gastro-esophageal reflux disease with esophagitis: Secondary | ICD-10-CM | POA: Diagnosis not present

## 2015-02-27 DIAGNOSIS — E1165 Type 2 diabetes mellitus with hyperglycemia: Secondary | ICD-10-CM | POA: Diagnosis not present

## 2015-02-27 DIAGNOSIS — I10 Essential (primary) hypertension: Secondary | ICD-10-CM | POA: Diagnosis not present

## 2015-02-27 DIAGNOSIS — G459 Transient cerebral ischemic attack, unspecified: Secondary | ICD-10-CM | POA: Diagnosis not present

## 2015-02-27 DIAGNOSIS — N189 Chronic kidney disease, unspecified: Secondary | ICD-10-CM | POA: Diagnosis not present

## 2015-03-06 DIAGNOSIS — I10 Essential (primary) hypertension: Secondary | ICD-10-CM | POA: Diagnosis not present

## 2015-03-06 DIAGNOSIS — N189 Chronic kidney disease, unspecified: Secondary | ICD-10-CM | POA: Diagnosis not present

## 2015-05-28 DIAGNOSIS — N189 Chronic kidney disease, unspecified: Secondary | ICD-10-CM | POA: Diagnosis not present

## 2015-05-28 DIAGNOSIS — I10 Essential (primary) hypertension: Secondary | ICD-10-CM | POA: Diagnosis not present

## 2015-05-28 DIAGNOSIS — E785 Hyperlipidemia, unspecified: Secondary | ICD-10-CM | POA: Diagnosis not present

## 2015-05-28 DIAGNOSIS — J45909 Unspecified asthma, uncomplicated: Secondary | ICD-10-CM | POA: Diagnosis not present

## 2015-05-28 DIAGNOSIS — E1165 Type 2 diabetes mellitus with hyperglycemia: Secondary | ICD-10-CM | POA: Diagnosis not present

## 2015-06-05 DIAGNOSIS — G459 Transient cerebral ischemic attack, unspecified: Secondary | ICD-10-CM | POA: Diagnosis not present

## 2015-06-05 DIAGNOSIS — E1165 Type 2 diabetes mellitus with hyperglycemia: Secondary | ICD-10-CM | POA: Diagnosis not present

## 2015-06-05 DIAGNOSIS — K210 Gastro-esophageal reflux disease with esophagitis: Secondary | ICD-10-CM | POA: Diagnosis not present

## 2015-09-05 DIAGNOSIS — Z23 Encounter for immunization: Secondary | ICD-10-CM | POA: Diagnosis not present

## 2015-09-05 DIAGNOSIS — E1165 Type 2 diabetes mellitus with hyperglycemia: Secondary | ICD-10-CM | POA: Diagnosis not present

## 2015-09-05 DIAGNOSIS — G459 Transient cerebral ischemic attack, unspecified: Secondary | ICD-10-CM | POA: Diagnosis not present

## 2015-09-05 DIAGNOSIS — Z79899 Other long term (current) drug therapy: Secondary | ICD-10-CM | POA: Diagnosis not present

## 2015-09-05 DIAGNOSIS — M7989 Other specified soft tissue disorders: Secondary | ICD-10-CM | POA: Diagnosis not present

## 2015-09-05 DIAGNOSIS — D649 Anemia, unspecified: Secondary | ICD-10-CM | POA: Diagnosis not present

## 2015-09-05 DIAGNOSIS — I10 Essential (primary) hypertension: Secondary | ICD-10-CM | POA: Diagnosis not present

## 2015-09-08 DIAGNOSIS — M7989 Other specified soft tissue disorders: Secondary | ICD-10-CM | POA: Diagnosis not present

## 2015-09-08 DIAGNOSIS — Z1231 Encounter for screening mammogram for malignant neoplasm of breast: Secondary | ICD-10-CM | POA: Diagnosis not present

## 2015-09-08 DIAGNOSIS — R791 Abnormal coagulation profile: Secondary | ICD-10-CM | POA: Diagnosis not present

## 2015-09-11 DIAGNOSIS — D649 Anemia, unspecified: Secondary | ICD-10-CM | POA: Diagnosis not present

## 2015-09-11 DIAGNOSIS — Z1212 Encounter for screening for malignant neoplasm of rectum: Secondary | ICD-10-CM | POA: Diagnosis not present

## 2015-09-11 DIAGNOSIS — G459 Transient cerebral ischemic attack, unspecified: Secondary | ICD-10-CM | POA: Diagnosis not present

## 2015-09-11 DIAGNOSIS — D519 Vitamin B12 deficiency anemia, unspecified: Secondary | ICD-10-CM | POA: Diagnosis not present

## 2015-09-11 DIAGNOSIS — K319 Disease of stomach and duodenum, unspecified: Secondary | ICD-10-CM | POA: Diagnosis not present

## 2015-10-07 DIAGNOSIS — Z791 Long term (current) use of non-steroidal anti-inflammatories (NSAID): Secondary | ICD-10-CM | POA: Diagnosis not present

## 2015-10-07 DIAGNOSIS — Z86010 Personal history of colonic polyps: Secondary | ICD-10-CM | POA: Diagnosis not present

## 2015-10-07 DIAGNOSIS — D509 Iron deficiency anemia, unspecified: Secondary | ICD-10-CM | POA: Diagnosis not present

## 2015-10-08 DIAGNOSIS — D509 Iron deficiency anemia, unspecified: Secondary | ICD-10-CM | POA: Diagnosis not present

## 2015-10-28 DIAGNOSIS — I739 Peripheral vascular disease, unspecified: Secondary | ICD-10-CM | POA: Diagnosis not present

## 2015-10-28 DIAGNOSIS — I10 Essential (primary) hypertension: Secondary | ICD-10-CM | POA: Diagnosis not present

## 2015-10-28 DIAGNOSIS — E781 Pure hyperglyceridemia: Secondary | ICD-10-CM | POA: Diagnosis not present

## 2015-10-28 DIAGNOSIS — I2510 Atherosclerotic heart disease of native coronary artery without angina pectoris: Secondary | ICD-10-CM | POA: Diagnosis not present

## 2015-10-28 DIAGNOSIS — E119 Type 2 diabetes mellitus without complications: Secondary | ICD-10-CM | POA: Diagnosis not present

## 2015-10-30 DIAGNOSIS — I739 Peripheral vascular disease, unspecified: Secondary | ICD-10-CM | POA: Diagnosis not present

## 2015-10-30 DIAGNOSIS — I2510 Atherosclerotic heart disease of native coronary artery without angina pectoris: Secondary | ICD-10-CM | POA: Diagnosis not present

## 2015-11-12 DIAGNOSIS — Z951 Presence of aortocoronary bypass graft: Secondary | ICD-10-CM | POA: Diagnosis not present

## 2015-11-12 DIAGNOSIS — I2510 Atherosclerotic heart disease of native coronary artery without angina pectoris: Secondary | ICD-10-CM | POA: Diagnosis not present

## 2015-11-12 DIAGNOSIS — I739 Peripheral vascular disease, unspecified: Secondary | ICD-10-CM | POA: Diagnosis not present

## 2015-11-12 DIAGNOSIS — E781 Pure hyperglyceridemia: Secondary | ICD-10-CM | POA: Diagnosis not present

## 2016-01-05 DIAGNOSIS — K319 Disease of stomach and duodenum, unspecified: Secondary | ICD-10-CM | POA: Diagnosis not present

## 2016-01-05 DIAGNOSIS — I10 Essential (primary) hypertension: Secondary | ICD-10-CM | POA: Diagnosis not present

## 2016-01-05 DIAGNOSIS — G459 Transient cerebral ischemic attack, unspecified: Secondary | ICD-10-CM | POA: Diagnosis not present

## 2016-01-05 DIAGNOSIS — E1165 Type 2 diabetes mellitus with hyperglycemia: Secondary | ICD-10-CM | POA: Diagnosis not present

## 2016-01-05 DIAGNOSIS — E038 Other specified hypothyroidism: Secondary | ICD-10-CM | POA: Diagnosis not present

## 2016-01-21 DIAGNOSIS — E1165 Type 2 diabetes mellitus with hyperglycemia: Secondary | ICD-10-CM | POA: Diagnosis not present

## 2016-04-19 DIAGNOSIS — Z6822 Body mass index (BMI) 22.0-22.9, adult: Secondary | ICD-10-CM | POA: Diagnosis not present

## 2016-04-19 DIAGNOSIS — E119 Type 2 diabetes mellitus without complications: Secondary | ICD-10-CM | POA: Diagnosis not present

## 2016-04-19 DIAGNOSIS — D649 Anemia, unspecified: Secondary | ICD-10-CM | POA: Diagnosis not present

## 2016-04-19 DIAGNOSIS — N959 Unspecified menopausal and perimenopausal disorder: Secondary | ICD-10-CM | POA: Diagnosis not present

## 2016-04-19 DIAGNOSIS — M1990 Unspecified osteoarthritis, unspecified site: Secondary | ICD-10-CM | POA: Diagnosis not present

## 2016-04-19 DIAGNOSIS — M542 Cervicalgia: Secondary | ICD-10-CM | POA: Diagnosis not present

## 2016-04-19 DIAGNOSIS — E785 Hyperlipidemia, unspecified: Secondary | ICD-10-CM | POA: Diagnosis not present

## 2016-04-20 DIAGNOSIS — E1165 Type 2 diabetes mellitus with hyperglycemia: Secondary | ICD-10-CM | POA: Diagnosis not present

## 2016-05-04 DIAGNOSIS — N189 Chronic kidney disease, unspecified: Secondary | ICD-10-CM | POA: Diagnosis not present

## 2016-05-04 DIAGNOSIS — E1165 Type 2 diabetes mellitus with hyperglycemia: Secondary | ICD-10-CM | POA: Diagnosis not present

## 2016-05-04 DIAGNOSIS — I2510 Atherosclerotic heart disease of native coronary artery without angina pectoris: Secondary | ICD-10-CM | POA: Diagnosis not present

## 2016-05-04 DIAGNOSIS — I509 Heart failure, unspecified: Secondary | ICD-10-CM | POA: Diagnosis not present

## 2016-05-04 DIAGNOSIS — J449 Chronic obstructive pulmonary disease, unspecified: Secondary | ICD-10-CM | POA: Diagnosis not present

## 2016-05-18 DIAGNOSIS — E119 Type 2 diabetes mellitus without complications: Secondary | ICD-10-CM | POA: Diagnosis not present

## 2016-06-20 DIAGNOSIS — M545 Low back pain: Secondary | ICD-10-CM | POA: Diagnosis not present

## 2016-06-20 DIAGNOSIS — M5431 Sciatica, right side: Secondary | ICD-10-CM | POA: Diagnosis not present

## 2016-06-20 DIAGNOSIS — R52 Pain, unspecified: Secondary | ICD-10-CM | POA: Diagnosis not present

## 2016-06-20 DIAGNOSIS — M5441 Lumbago with sciatica, right side: Secondary | ICD-10-CM | POA: Diagnosis not present

## 2016-06-20 DIAGNOSIS — M5489 Other dorsalgia: Secondary | ICD-10-CM | POA: Diagnosis not present

## 2016-06-23 DIAGNOSIS — M5416 Radiculopathy, lumbar region: Secondary | ICD-10-CM | POA: Diagnosis not present

## 2016-06-24 DIAGNOSIS — M4806 Spinal stenosis, lumbar region: Secondary | ICD-10-CM | POA: Diagnosis not present

## 2016-06-24 DIAGNOSIS — M5416 Radiculopathy, lumbar region: Secondary | ICD-10-CM | POA: Diagnosis not present

## 2016-06-26 DIAGNOSIS — S52502A Unspecified fracture of the lower end of left radius, initial encounter for closed fracture: Secondary | ICD-10-CM | POA: Diagnosis not present

## 2016-06-26 DIAGNOSIS — S62102A Fracture of unspecified carpal bone, left wrist, initial encounter for closed fracture: Secondary | ICD-10-CM | POA: Diagnosis not present

## 2016-06-28 DIAGNOSIS — S52502A Unspecified fracture of the lower end of left radius, initial encounter for closed fracture: Secondary | ICD-10-CM | POA: Diagnosis not present

## 2016-06-29 DIAGNOSIS — Z01818 Encounter for other preprocedural examination: Secondary | ICD-10-CM

## 2016-06-29 DIAGNOSIS — I739 Peripheral vascular disease, unspecified: Secondary | ICD-10-CM | POA: Diagnosis not present

## 2016-06-29 DIAGNOSIS — E782 Mixed hyperlipidemia: Secondary | ICD-10-CM | POA: Diagnosis not present

## 2016-06-29 DIAGNOSIS — I2510 Atherosclerotic heart disease of native coronary artery without angina pectoris: Secondary | ICD-10-CM | POA: Diagnosis not present

## 2016-06-29 DIAGNOSIS — Z951 Presence of aortocoronary bypass graft: Secondary | ICD-10-CM | POA: Diagnosis not present

## 2016-06-29 DIAGNOSIS — I10 Essential (primary) hypertension: Secondary | ICD-10-CM | POA: Diagnosis not present

## 2016-06-29 HISTORY — DX: Encounter for other preprocedural examination: Z01.818

## 2016-06-30 DIAGNOSIS — Z01818 Encounter for other preprocedural examination: Secondary | ICD-10-CM | POA: Diagnosis not present

## 2016-06-30 DIAGNOSIS — E785 Hyperlipidemia, unspecified: Secondary | ICD-10-CM | POA: Diagnosis not present

## 2016-06-30 DIAGNOSIS — I2510 Atherosclerotic heart disease of native coronary artery without angina pectoris: Secondary | ICD-10-CM | POA: Diagnosis not present

## 2016-06-30 DIAGNOSIS — S52509A Unspecified fracture of the lower end of unspecified radius, initial encounter for closed fracture: Secondary | ICD-10-CM | POA: Diagnosis not present

## 2016-07-01 DIAGNOSIS — I2510 Atherosclerotic heart disease of native coronary artery without angina pectoris: Secondary | ICD-10-CM | POA: Diagnosis not present

## 2016-07-01 DIAGNOSIS — Z7982 Long term (current) use of aspirin: Secondary | ICD-10-CM | POA: Diagnosis not present

## 2016-07-01 DIAGNOSIS — E7800 Pure hypercholesterolemia, unspecified: Secondary | ICD-10-CM | POA: Diagnosis not present

## 2016-07-01 DIAGNOSIS — S52572A Other intraarticular fracture of lower end of left radius, initial encounter for closed fracture: Secondary | ICD-10-CM | POA: Diagnosis not present

## 2016-07-01 DIAGNOSIS — S52502A Unspecified fracture of the lower end of left radius, initial encounter for closed fracture: Secondary | ICD-10-CM | POA: Diagnosis not present

## 2016-07-01 DIAGNOSIS — I252 Old myocardial infarction: Secondary | ICD-10-CM | POA: Diagnosis not present

## 2016-07-01 DIAGNOSIS — Z794 Long term (current) use of insulin: Secondary | ICD-10-CM | POA: Diagnosis not present

## 2016-07-01 DIAGNOSIS — Z7984 Long term (current) use of oral hypoglycemic drugs: Secondary | ICD-10-CM | POA: Diagnosis not present

## 2016-07-01 DIAGNOSIS — I110 Hypertensive heart disease with heart failure: Secondary | ICD-10-CM | POA: Diagnosis not present

## 2016-07-01 DIAGNOSIS — S52602A Unspecified fracture of lower end of left ulna, initial encounter for closed fracture: Secondary | ICD-10-CM | POA: Diagnosis not present

## 2016-07-01 DIAGNOSIS — Z79899 Other long term (current) drug therapy: Secondary | ICD-10-CM | POA: Diagnosis not present

## 2016-07-01 DIAGNOSIS — J449 Chronic obstructive pulmonary disease, unspecified: Secondary | ICD-10-CM | POA: Diagnosis not present

## 2016-07-01 DIAGNOSIS — Z951 Presence of aortocoronary bypass graft: Secondary | ICD-10-CM | POA: Diagnosis not present

## 2016-07-01 DIAGNOSIS — S52572D Other intraarticular fracture of lower end of left radius, subsequent encounter for closed fracture with routine healing: Secondary | ICD-10-CM | POA: Diagnosis not present

## 2016-07-01 DIAGNOSIS — S52602D Unspecified fracture of lower end of left ulna, subsequent encounter for closed fracture with routine healing: Secondary | ICD-10-CM | POA: Diagnosis not present

## 2016-07-01 DIAGNOSIS — E039 Hypothyroidism, unspecified: Secondary | ICD-10-CM | POA: Diagnosis not present

## 2016-07-01 DIAGNOSIS — Z01810 Encounter for preprocedural cardiovascular examination: Secondary | ICD-10-CM | POA: Diagnosis not present

## 2016-07-01 DIAGNOSIS — I509 Heart failure, unspecified: Secondary | ICD-10-CM | POA: Diagnosis not present

## 2016-07-01 DIAGNOSIS — J45909 Unspecified asthma, uncomplicated: Secondary | ICD-10-CM | POA: Diagnosis not present

## 2016-07-01 DIAGNOSIS — E1151 Type 2 diabetes mellitus with diabetic peripheral angiopathy without gangrene: Secondary | ICD-10-CM | POA: Diagnosis not present

## 2016-07-01 DIAGNOSIS — M85832 Other specified disorders of bone density and structure, left forearm: Secondary | ICD-10-CM | POA: Diagnosis not present

## 2016-07-02 DIAGNOSIS — Z7984 Long term (current) use of oral hypoglycemic drugs: Secondary | ICD-10-CM | POA: Diagnosis not present

## 2016-07-02 DIAGNOSIS — J45909 Unspecified asthma, uncomplicated: Secondary | ICD-10-CM | POA: Diagnosis not present

## 2016-07-02 DIAGNOSIS — E039 Hypothyroidism, unspecified: Secondary | ICD-10-CM | POA: Diagnosis not present

## 2016-07-02 DIAGNOSIS — Z951 Presence of aortocoronary bypass graft: Secondary | ICD-10-CM | POA: Diagnosis not present

## 2016-07-02 DIAGNOSIS — I2510 Atherosclerotic heart disease of native coronary artery without angina pectoris: Secondary | ICD-10-CM | POA: Diagnosis not present

## 2016-07-02 DIAGNOSIS — Z7982 Long term (current) use of aspirin: Secondary | ICD-10-CM | POA: Diagnosis not present

## 2016-07-02 DIAGNOSIS — S52572D Other intraarticular fracture of lower end of left radius, subsequent encounter for closed fracture with routine healing: Secondary | ICD-10-CM | POA: Diagnosis not present

## 2016-07-02 DIAGNOSIS — E1151 Type 2 diabetes mellitus with diabetic peripheral angiopathy without gangrene: Secondary | ICD-10-CM | POA: Diagnosis not present

## 2016-07-02 DIAGNOSIS — I252 Old myocardial infarction: Secondary | ICD-10-CM | POA: Diagnosis not present

## 2016-07-02 DIAGNOSIS — S52602D Unspecified fracture of lower end of left ulna, subsequent encounter for closed fracture with routine healing: Secondary | ICD-10-CM | POA: Diagnosis not present

## 2016-07-02 DIAGNOSIS — E7800 Pure hypercholesterolemia, unspecified: Secondary | ICD-10-CM | POA: Diagnosis not present

## 2016-07-02 DIAGNOSIS — I509 Heart failure, unspecified: Secondary | ICD-10-CM | POA: Diagnosis not present

## 2016-07-02 DIAGNOSIS — Z79899 Other long term (current) drug therapy: Secondary | ICD-10-CM | POA: Diagnosis not present

## 2016-07-02 DIAGNOSIS — Z794 Long term (current) use of insulin: Secondary | ICD-10-CM | POA: Diagnosis not present

## 2016-07-02 DIAGNOSIS — J449 Chronic obstructive pulmonary disease, unspecified: Secondary | ICD-10-CM | POA: Diagnosis not present

## 2016-07-02 DIAGNOSIS — M85832 Other specified disorders of bone density and structure, left forearm: Secondary | ICD-10-CM | POA: Diagnosis not present

## 2016-07-02 DIAGNOSIS — I110 Hypertensive heart disease with heart failure: Secondary | ICD-10-CM | POA: Diagnosis not present

## 2016-07-16 DIAGNOSIS — S52502D Unspecified fracture of the lower end of left radius, subsequent encounter for closed fracture with routine healing: Secondary | ICD-10-CM | POA: Diagnosis not present

## 2016-07-30 DIAGNOSIS — S52502D Unspecified fracture of the lower end of left radius, subsequent encounter for closed fracture with routine healing: Secondary | ICD-10-CM | POA: Diagnosis not present

## 2016-08-12 DIAGNOSIS — I252 Old myocardial infarction: Secondary | ICD-10-CM | POA: Diagnosis not present

## 2016-08-12 DIAGNOSIS — E7800 Pure hypercholesterolemia, unspecified: Secondary | ICD-10-CM | POA: Diagnosis not present

## 2016-08-12 DIAGNOSIS — S52602D Unspecified fracture of lower end of left ulna, subsequent encounter for closed fracture with routine healing: Secondary | ICD-10-CM | POA: Diagnosis not present

## 2016-08-12 DIAGNOSIS — Z79899 Other long term (current) drug therapy: Secondary | ICD-10-CM | POA: Diagnosis not present

## 2016-08-12 DIAGNOSIS — Z87891 Personal history of nicotine dependence: Secondary | ICD-10-CM | POA: Diagnosis not present

## 2016-08-12 DIAGNOSIS — I2510 Atherosclerotic heart disease of native coronary artery without angina pectoris: Secondary | ICD-10-CM | POA: Diagnosis not present

## 2016-08-12 DIAGNOSIS — S52612A Displaced fracture of left ulna styloid process, initial encounter for closed fracture: Secondary | ICD-10-CM | POA: Diagnosis not present

## 2016-08-12 DIAGNOSIS — S52572D Other intraarticular fracture of lower end of left radius, subsequent encounter for closed fracture with routine healing: Secondary | ICD-10-CM | POA: Diagnosis not present

## 2016-08-12 DIAGNOSIS — Z7984 Long term (current) use of oral hypoglycemic drugs: Secondary | ICD-10-CM | POA: Diagnosis not present

## 2016-08-12 DIAGNOSIS — S52502D Unspecified fracture of the lower end of left radius, subsequent encounter for closed fracture with routine healing: Secondary | ICD-10-CM | POA: Diagnosis not present

## 2016-08-12 DIAGNOSIS — J45909 Unspecified asthma, uncomplicated: Secondary | ICD-10-CM | POA: Diagnosis not present

## 2016-08-12 DIAGNOSIS — J449 Chronic obstructive pulmonary disease, unspecified: Secondary | ICD-10-CM | POA: Diagnosis not present

## 2016-08-12 DIAGNOSIS — Z794 Long term (current) use of insulin: Secondary | ICD-10-CM | POA: Diagnosis not present

## 2016-08-12 DIAGNOSIS — I110 Hypertensive heart disease with heart failure: Secondary | ICD-10-CM | POA: Diagnosis not present

## 2016-08-12 DIAGNOSIS — Z4789 Encounter for other orthopedic aftercare: Secondary | ICD-10-CM | POA: Diagnosis not present

## 2016-08-12 DIAGNOSIS — I509 Heart failure, unspecified: Secondary | ICD-10-CM | POA: Diagnosis not present

## 2016-08-12 DIAGNOSIS — S59292A Other physeal fracture of lower end of radius, left arm, initial encounter for closed fracture: Secondary | ICD-10-CM | POA: Diagnosis not present

## 2016-08-12 DIAGNOSIS — E119 Type 2 diabetes mellitus without complications: Secondary | ICD-10-CM | POA: Diagnosis not present

## 2016-08-12 DIAGNOSIS — E039 Hypothyroidism, unspecified: Secondary | ICD-10-CM | POA: Diagnosis not present

## 2016-08-12 DIAGNOSIS — Z951 Presence of aortocoronary bypass graft: Secondary | ICD-10-CM | POA: Diagnosis not present

## 2016-08-12 DIAGNOSIS — Z7982 Long term (current) use of aspirin: Secondary | ICD-10-CM | POA: Diagnosis not present

## 2016-08-24 DIAGNOSIS — S52502D Unspecified fracture of the lower end of left radius, subsequent encounter for closed fracture with routine healing: Secondary | ICD-10-CM | POA: Diagnosis not present

## 2016-09-07 DIAGNOSIS — S52502D Unspecified fracture of the lower end of left radius, subsequent encounter for closed fracture with routine healing: Secondary | ICD-10-CM | POA: Diagnosis not present

## 2016-09-21 DIAGNOSIS — S52502D Unspecified fracture of the lower end of left radius, subsequent encounter for closed fracture with routine healing: Secondary | ICD-10-CM | POA: Diagnosis not present

## 2016-09-29 DIAGNOSIS — I739 Peripheral vascular disease, unspecified: Secondary | ICD-10-CM | POA: Diagnosis not present

## 2016-09-29 DIAGNOSIS — Z951 Presence of aortocoronary bypass graft: Secondary | ICD-10-CM | POA: Diagnosis not present

## 2016-09-29 DIAGNOSIS — E782 Mixed hyperlipidemia: Secondary | ICD-10-CM | POA: Diagnosis not present

## 2016-09-29 DIAGNOSIS — I10 Essential (primary) hypertension: Secondary | ICD-10-CM | POA: Diagnosis not present

## 2016-09-29 DIAGNOSIS — I2510 Atherosclerotic heart disease of native coronary artery without angina pectoris: Secondary | ICD-10-CM | POA: Diagnosis not present

## 2016-10-01 DIAGNOSIS — M25432 Effusion, left wrist: Secondary | ICD-10-CM | POA: Diagnosis not present

## 2016-10-01 DIAGNOSIS — M25532 Pain in left wrist: Secondary | ICD-10-CM | POA: Diagnosis not present

## 2016-10-01 DIAGNOSIS — M25632 Stiffness of left wrist, not elsewhere classified: Secondary | ICD-10-CM | POA: Diagnosis not present

## 2016-10-07 DIAGNOSIS — M25532 Pain in left wrist: Secondary | ICD-10-CM | POA: Diagnosis not present

## 2016-10-07 DIAGNOSIS — M25432 Effusion, left wrist: Secondary | ICD-10-CM | POA: Diagnosis not present

## 2016-10-07 DIAGNOSIS — M25632 Stiffness of left wrist, not elsewhere classified: Secondary | ICD-10-CM | POA: Diagnosis not present

## 2016-10-13 DIAGNOSIS — I739 Peripheral vascular disease, unspecified: Secondary | ICD-10-CM | POA: Diagnosis not present

## 2016-10-13 DIAGNOSIS — Z951 Presence of aortocoronary bypass graft: Secondary | ICD-10-CM | POA: Diagnosis not present

## 2016-10-13 DIAGNOSIS — I2510 Atherosclerotic heart disease of native coronary artery without angina pectoris: Secondary | ICD-10-CM | POA: Diagnosis not present

## 2016-10-13 DIAGNOSIS — I10 Essential (primary) hypertension: Secondary | ICD-10-CM | POA: Diagnosis not present

## 2016-10-13 DIAGNOSIS — E782 Mixed hyperlipidemia: Secondary | ICD-10-CM | POA: Diagnosis not present

## 2016-10-14 DIAGNOSIS — M25532 Pain in left wrist: Secondary | ICD-10-CM | POA: Diagnosis not present

## 2016-10-14 DIAGNOSIS — M25432 Effusion, left wrist: Secondary | ICD-10-CM | POA: Diagnosis not present

## 2016-10-14 DIAGNOSIS — M25632 Stiffness of left wrist, not elsewhere classified: Secondary | ICD-10-CM | POA: Diagnosis not present

## 2016-10-19 DIAGNOSIS — S52502D Unspecified fracture of the lower end of left radius, subsequent encounter for closed fracture with routine healing: Secondary | ICD-10-CM | POA: Diagnosis not present

## 2016-10-25 DIAGNOSIS — M25432 Effusion, left wrist: Secondary | ICD-10-CM | POA: Diagnosis not present

## 2016-10-25 DIAGNOSIS — M25532 Pain in left wrist: Secondary | ICD-10-CM | POA: Diagnosis not present

## 2016-10-25 DIAGNOSIS — M25632 Stiffness of left wrist, not elsewhere classified: Secondary | ICD-10-CM | POA: Diagnosis not present

## 2016-11-04 DIAGNOSIS — Z780 Asymptomatic menopausal state: Secondary | ICD-10-CM | POA: Diagnosis not present

## 2016-11-04 DIAGNOSIS — Z13820 Encounter for screening for osteoporosis: Secondary | ICD-10-CM | POA: Diagnosis not present

## 2016-11-04 DIAGNOSIS — M85852 Other specified disorders of bone density and structure, left thigh: Secondary | ICD-10-CM | POA: Diagnosis not present

## 2016-11-09 DIAGNOSIS — S52502K Unspecified fracture of the lower end of left radius, subsequent encounter for closed fracture with nonunion: Secondary | ICD-10-CM | POA: Diagnosis not present

## 2016-12-21 DIAGNOSIS — S52502K Unspecified fracture of the lower end of left radius, subsequent encounter for closed fracture with nonunion: Secondary | ICD-10-CM | POA: Diagnosis not present

## 2016-12-29 DIAGNOSIS — S52502K Unspecified fracture of the lower end of left radius, subsequent encounter for closed fracture with nonunion: Secondary | ICD-10-CM | POA: Diagnosis not present

## 2016-12-29 DIAGNOSIS — E119 Type 2 diabetes mellitus without complications: Secondary | ICD-10-CM | POA: Diagnosis not present

## 2017-01-11 DIAGNOSIS — J449 Chronic obstructive pulmonary disease, unspecified: Secondary | ICD-10-CM | POA: Diagnosis not present

## 2017-01-11 DIAGNOSIS — I10 Essential (primary) hypertension: Secondary | ICD-10-CM | POA: Diagnosis not present

## 2017-01-11 DIAGNOSIS — I2510 Atherosclerotic heart disease of native coronary artery without angina pectoris: Secondary | ICD-10-CM | POA: Diagnosis not present

## 2017-01-11 DIAGNOSIS — E559 Vitamin D deficiency, unspecified: Secondary | ICD-10-CM | POA: Diagnosis not present

## 2017-01-11 DIAGNOSIS — I509 Heart failure, unspecified: Secondary | ICD-10-CM | POA: Diagnosis not present

## 2017-01-11 DIAGNOSIS — E1165 Type 2 diabetes mellitus with hyperglycemia: Secondary | ICD-10-CM | POA: Diagnosis not present

## 2017-01-11 DIAGNOSIS — E785 Hyperlipidemia, unspecified: Secondary | ICD-10-CM | POA: Diagnosis not present

## 2017-01-11 DIAGNOSIS — E663 Overweight: Secondary | ICD-10-CM | POA: Diagnosis not present

## 2017-01-11 DIAGNOSIS — E063 Autoimmune thyroiditis: Secondary | ICD-10-CM | POA: Diagnosis not present

## 2017-01-11 DIAGNOSIS — N189 Chronic kidney disease, unspecified: Secondary | ICD-10-CM | POA: Diagnosis not present

## 2017-01-11 DIAGNOSIS — D649 Anemia, unspecified: Secondary | ICD-10-CM | POA: Diagnosis not present

## 2017-01-11 DIAGNOSIS — Z6829 Body mass index (BMI) 29.0-29.9, adult: Secondary | ICD-10-CM | POA: Diagnosis not present

## 2017-01-17 DIAGNOSIS — Z1211 Encounter for screening for malignant neoplasm of colon: Secondary | ICD-10-CM | POA: Diagnosis not present

## 2017-01-17 DIAGNOSIS — Z1212 Encounter for screening for malignant neoplasm of rectum: Secondary | ICD-10-CM | POA: Diagnosis not present

## 2017-02-09 DIAGNOSIS — I2510 Atherosclerotic heart disease of native coronary artery without angina pectoris: Secondary | ICD-10-CM | POA: Diagnosis not present

## 2017-02-09 DIAGNOSIS — Z7901 Long term (current) use of anticoagulants: Secondary | ICD-10-CM | POA: Diagnosis not present

## 2017-02-09 DIAGNOSIS — R195 Other fecal abnormalities: Secondary | ICD-10-CM | POA: Diagnosis not present

## 2017-02-15 DIAGNOSIS — S52502K Unspecified fracture of the lower end of left radius, subsequent encounter for closed fracture with nonunion: Secondary | ICD-10-CM | POA: Diagnosis not present

## 2017-03-01 DIAGNOSIS — D631 Anemia in chronic kidney disease: Secondary | ICD-10-CM | POA: Diagnosis not present

## 2017-03-01 DIAGNOSIS — I25810 Atherosclerosis of coronary artery bypass graft(s) without angina pectoris: Secondary | ICD-10-CM | POA: Diagnosis not present

## 2017-03-01 DIAGNOSIS — N189 Chronic kidney disease, unspecified: Secondary | ICD-10-CM | POA: Diagnosis not present

## 2017-03-01 DIAGNOSIS — D126 Benign neoplasm of colon, unspecified: Secondary | ICD-10-CM | POA: Diagnosis not present

## 2017-03-01 DIAGNOSIS — K5730 Diverticulosis of large intestine without perforation or abscess without bleeding: Secondary | ICD-10-CM | POA: Diagnosis not present

## 2017-03-01 DIAGNOSIS — Z8673 Personal history of transient ischemic attack (TIA), and cerebral infarction without residual deficits: Secondary | ICD-10-CM | POA: Diagnosis not present

## 2017-03-01 DIAGNOSIS — D122 Benign neoplasm of ascending colon: Secondary | ICD-10-CM | POA: Diagnosis not present

## 2017-03-01 DIAGNOSIS — K648 Other hemorrhoids: Secondary | ICD-10-CM | POA: Diagnosis not present

## 2017-03-01 DIAGNOSIS — I129 Hypertensive chronic kidney disease with stage 1 through stage 4 chronic kidney disease, or unspecified chronic kidney disease: Secondary | ICD-10-CM | POA: Diagnosis not present

## 2017-03-01 DIAGNOSIS — D124 Benign neoplasm of descending colon: Secondary | ICD-10-CM | POA: Diagnosis not present

## 2017-03-01 DIAGNOSIS — R195 Other fecal abnormalities: Secondary | ICD-10-CM | POA: Diagnosis not present

## 2017-04-11 DIAGNOSIS — M546 Pain in thoracic spine: Secondary | ICD-10-CM | POA: Diagnosis not present

## 2017-04-11 DIAGNOSIS — E785 Hyperlipidemia, unspecified: Secondary | ICD-10-CM | POA: Diagnosis not present

## 2017-04-11 DIAGNOSIS — E063 Autoimmune thyroiditis: Secondary | ICD-10-CM | POA: Diagnosis not present

## 2017-04-11 DIAGNOSIS — M545 Low back pain: Secondary | ICD-10-CM | POA: Diagnosis not present

## 2017-04-11 DIAGNOSIS — D649 Anemia, unspecified: Secondary | ICD-10-CM | POA: Diagnosis not present

## 2017-04-11 DIAGNOSIS — I2510 Atherosclerotic heart disease of native coronary artery without angina pectoris: Secondary | ICD-10-CM | POA: Diagnosis not present

## 2017-04-11 DIAGNOSIS — R69 Illness, unspecified: Secondary | ICD-10-CM | POA: Diagnosis not present

## 2017-04-11 DIAGNOSIS — E119 Type 2 diabetes mellitus without complications: Secondary | ICD-10-CM | POA: Diagnosis not present

## 2017-04-11 DIAGNOSIS — N189 Chronic kidney disease, unspecified: Secondary | ICD-10-CM | POA: Diagnosis not present

## 2017-04-11 DIAGNOSIS — E559 Vitamin D deficiency, unspecified: Secondary | ICD-10-CM | POA: Diagnosis not present

## 2017-04-11 DIAGNOSIS — K210 Gastro-esophageal reflux disease with esophagitis: Secondary | ICD-10-CM | POA: Diagnosis not present

## 2017-04-15 DIAGNOSIS — I700 Atherosclerosis of aorta: Secondary | ICD-10-CM | POA: Diagnosis not present

## 2017-04-15 DIAGNOSIS — M40204 Unspecified kyphosis, thoracic region: Secondary | ICD-10-CM | POA: Diagnosis not present

## 2017-04-15 DIAGNOSIS — M5136 Other intervertebral disc degeneration, lumbar region: Secondary | ICD-10-CM | POA: Diagnosis not present

## 2017-04-15 DIAGNOSIS — M5134 Other intervertebral disc degeneration, thoracic region: Secondary | ICD-10-CM | POA: Diagnosis not present

## 2017-06-21 DIAGNOSIS — R079 Chest pain, unspecified: Secondary | ICD-10-CM | POA: Diagnosis not present

## 2017-06-23 DIAGNOSIS — R079 Chest pain, unspecified: Secondary | ICD-10-CM | POA: Diagnosis not present

## 2017-07-05 DIAGNOSIS — R079 Chest pain, unspecified: Secondary | ICD-10-CM | POA: Diagnosis not present

## 2017-07-08 DIAGNOSIS — R69 Illness, unspecified: Secondary | ICD-10-CM | POA: Diagnosis not present

## 2017-07-12 DIAGNOSIS — N189 Chronic kidney disease, unspecified: Secondary | ICD-10-CM | POA: Diagnosis not present

## 2017-07-12 DIAGNOSIS — E785 Hyperlipidemia, unspecified: Secondary | ICD-10-CM | POA: Diagnosis not present

## 2017-07-12 DIAGNOSIS — R69 Illness, unspecified: Secondary | ICD-10-CM | POA: Diagnosis not present

## 2017-07-12 DIAGNOSIS — K210 Gastro-esophageal reflux disease with esophagitis: Secondary | ICD-10-CM | POA: Diagnosis not present

## 2017-07-12 DIAGNOSIS — Z6830 Body mass index (BMI) 30.0-30.9, adult: Secondary | ICD-10-CM | POA: Diagnosis not present

## 2017-07-12 DIAGNOSIS — E119 Type 2 diabetes mellitus without complications: Secondary | ICD-10-CM | POA: Diagnosis not present

## 2017-07-12 DIAGNOSIS — E559 Vitamin D deficiency, unspecified: Secondary | ICD-10-CM | POA: Diagnosis not present

## 2017-07-12 DIAGNOSIS — Z1389 Encounter for screening for other disorder: Secondary | ICD-10-CM | POA: Diagnosis not present

## 2017-07-12 DIAGNOSIS — D649 Anemia, unspecified: Secondary | ICD-10-CM | POA: Diagnosis not present

## 2017-07-12 DIAGNOSIS — E063 Autoimmune thyroiditis: Secondary | ICD-10-CM | POA: Diagnosis not present

## 2017-07-12 DIAGNOSIS — Z9181 History of falling: Secondary | ICD-10-CM | POA: Diagnosis not present

## 2017-07-12 DIAGNOSIS — E538 Deficiency of other specified B group vitamins: Secondary | ICD-10-CM | POA: Diagnosis not present

## 2017-08-09 DIAGNOSIS — I10 Essential (primary) hypertension: Secondary | ICD-10-CM

## 2017-08-09 DIAGNOSIS — Z951 Presence of aortocoronary bypass graft: Secondary | ICD-10-CM

## 2017-08-09 DIAGNOSIS — I200 Unstable angina: Secondary | ICD-10-CM | POA: Diagnosis not present

## 2017-08-09 DIAGNOSIS — I6523 Occlusion and stenosis of bilateral carotid arteries: Secondary | ICD-10-CM | POA: Diagnosis not present

## 2017-08-09 DIAGNOSIS — Z01818 Encounter for other preprocedural examination: Secondary | ICD-10-CM | POA: Diagnosis not present

## 2017-08-09 DIAGNOSIS — E785 Hyperlipidemia, unspecified: Secondary | ICD-10-CM | POA: Diagnosis not present

## 2017-08-09 DIAGNOSIS — R9431 Abnormal electrocardiogram [ECG] [EKG]: Secondary | ICD-10-CM | POA: Diagnosis not present

## 2017-08-09 DIAGNOSIS — R079 Chest pain, unspecified: Secondary | ICD-10-CM

## 2017-08-09 DIAGNOSIS — I739 Peripheral vascular disease, unspecified: Secondary | ICD-10-CM

## 2017-08-09 DIAGNOSIS — E782 Mixed hyperlipidemia: Secondary | ICD-10-CM | POA: Insufficient documentation

## 2017-08-09 DIAGNOSIS — R9439 Abnormal result of other cardiovascular function study: Secondary | ICD-10-CM

## 2017-08-09 HISTORY — DX: Abnormal result of other cardiovascular function study: R94.39

## 2017-08-09 HISTORY — DX: Peripheral vascular disease, unspecified: I73.9

## 2017-08-09 HISTORY — DX: Essential (primary) hypertension: I10

## 2017-08-09 HISTORY — DX: Chest pain, unspecified: R07.9

## 2017-08-09 HISTORY — DX: Mixed hyperlipidemia: E78.2

## 2017-08-09 HISTORY — DX: Presence of aortocoronary bypass graft: Z95.1

## 2017-08-11 DIAGNOSIS — Z951 Presence of aortocoronary bypass graft: Secondary | ICD-10-CM | POA: Diagnosis not present

## 2017-08-11 DIAGNOSIS — R079 Chest pain, unspecified: Secondary | ICD-10-CM | POA: Diagnosis not present

## 2017-08-11 DIAGNOSIS — I25118 Atherosclerotic heart disease of native coronary artery with other forms of angina pectoris: Secondary | ICD-10-CM | POA: Diagnosis not present

## 2017-08-11 DIAGNOSIS — I25119 Atherosclerotic heart disease of native coronary artery with unspecified angina pectoris: Secondary | ICD-10-CM | POA: Diagnosis not present

## 2017-08-11 DIAGNOSIS — I25708 Atherosclerosis of coronary artery bypass graft(s), unspecified, with other forms of angina pectoris: Secondary | ICD-10-CM | POA: Diagnosis not present

## 2017-08-16 DIAGNOSIS — E119 Type 2 diabetes mellitus without complications: Secondary | ICD-10-CM | POA: Diagnosis not present

## 2017-08-16 DIAGNOSIS — I509 Heart failure, unspecified: Secondary | ICD-10-CM | POA: Diagnosis not present

## 2017-08-16 DIAGNOSIS — G4700 Insomnia, unspecified: Secondary | ICD-10-CM | POA: Diagnosis not present

## 2017-08-16 DIAGNOSIS — I2510 Atherosclerotic heart disease of native coronary artery without angina pectoris: Secondary | ICD-10-CM | POA: Diagnosis not present

## 2017-08-16 DIAGNOSIS — E039 Hypothyroidism, unspecified: Secondary | ICD-10-CM | POA: Diagnosis not present

## 2017-08-16 DIAGNOSIS — D519 Vitamin B12 deficiency anemia, unspecified: Secondary | ICD-10-CM | POA: Diagnosis not present

## 2017-08-16 DIAGNOSIS — E669 Obesity, unspecified: Secondary | ICD-10-CM | POA: Diagnosis not present

## 2017-08-16 DIAGNOSIS — Z0000 Encounter for general adult medical examination without abnormal findings: Secondary | ICD-10-CM | POA: Diagnosis not present

## 2017-08-16 DIAGNOSIS — E559 Vitamin D deficiency, unspecified: Secondary | ICD-10-CM | POA: Diagnosis not present

## 2017-08-16 DIAGNOSIS — E7800 Pure hypercholesterolemia, unspecified: Secondary | ICD-10-CM | POA: Diagnosis not present

## 2017-09-06 DIAGNOSIS — I10 Essential (primary) hypertension: Secondary | ICD-10-CM | POA: Diagnosis not present

## 2017-09-06 DIAGNOSIS — E785 Hyperlipidemia, unspecified: Secondary | ICD-10-CM | POA: Diagnosis not present

## 2017-09-06 DIAGNOSIS — I739 Peripheral vascular disease, unspecified: Secondary | ICD-10-CM | POA: Diagnosis not present

## 2017-09-06 DIAGNOSIS — Z794 Long term (current) use of insulin: Secondary | ICD-10-CM

## 2017-09-06 DIAGNOSIS — E1159 Type 2 diabetes mellitus with other circulatory complications: Secondary | ICD-10-CM

## 2017-09-06 DIAGNOSIS — Z951 Presence of aortocoronary bypass graft: Secondary | ICD-10-CM | POA: Diagnosis not present

## 2017-09-06 HISTORY — DX: Type 2 diabetes mellitus with other circulatory complications: Z79.4

## 2017-09-06 HISTORY — DX: Type 2 diabetes mellitus with other circulatory complications: E11.59

## 2017-10-12 DIAGNOSIS — J449 Chronic obstructive pulmonary disease, unspecified: Secondary | ICD-10-CM | POA: Diagnosis not present

## 2017-10-12 DIAGNOSIS — I2510 Atherosclerotic heart disease of native coronary artery without angina pectoris: Secondary | ICD-10-CM | POA: Diagnosis not present

## 2017-10-12 DIAGNOSIS — N189 Chronic kidney disease, unspecified: Secondary | ICD-10-CM | POA: Diagnosis not present

## 2017-10-12 DIAGNOSIS — E785 Hyperlipidemia, unspecified: Secondary | ICD-10-CM | POA: Diagnosis not present

## 2017-10-12 DIAGNOSIS — M1990 Unspecified osteoarthritis, unspecified site: Secondary | ICD-10-CM | POA: Diagnosis not present

## 2017-10-12 DIAGNOSIS — E063 Autoimmune thyroiditis: Secondary | ICD-10-CM | POA: Diagnosis not present

## 2017-10-12 DIAGNOSIS — I509 Heart failure, unspecified: Secondary | ICD-10-CM | POA: Diagnosis not present

## 2017-10-12 DIAGNOSIS — E119 Type 2 diabetes mellitus without complications: Secondary | ICD-10-CM | POA: Diagnosis not present

## 2017-10-12 DIAGNOSIS — Z6831 Body mass index (BMI) 31.0-31.9, adult: Secondary | ICD-10-CM | POA: Diagnosis not present

## 2017-10-12 DIAGNOSIS — Z23 Encounter for immunization: Secondary | ICD-10-CM | POA: Diagnosis not present

## 2018-01-12 DIAGNOSIS — N3281 Overactive bladder: Secondary | ICD-10-CM | POA: Diagnosis not present

## 2018-01-12 DIAGNOSIS — N189 Chronic kidney disease, unspecified: Secondary | ICD-10-CM | POA: Diagnosis not present

## 2018-01-12 DIAGNOSIS — E559 Vitamin D deficiency, unspecified: Secondary | ICD-10-CM | POA: Diagnosis not present

## 2018-01-12 DIAGNOSIS — R69 Illness, unspecified: Secondary | ICD-10-CM | POA: Diagnosis not present

## 2018-01-12 DIAGNOSIS — E119 Type 2 diabetes mellitus without complications: Secondary | ICD-10-CM | POA: Diagnosis not present

## 2018-01-12 DIAGNOSIS — E785 Hyperlipidemia, unspecified: Secondary | ICD-10-CM | POA: Diagnosis not present

## 2018-01-12 DIAGNOSIS — M1990 Unspecified osteoarthritis, unspecified site: Secondary | ICD-10-CM | POA: Diagnosis not present

## 2018-01-12 DIAGNOSIS — E669 Obesity, unspecified: Secondary | ICD-10-CM | POA: Diagnosis not present

## 2018-01-12 DIAGNOSIS — E063 Autoimmune thyroiditis: Secondary | ICD-10-CM | POA: Diagnosis not present

## 2018-01-12 DIAGNOSIS — D649 Anemia, unspecified: Secondary | ICD-10-CM | POA: Diagnosis not present

## 2018-01-20 DIAGNOSIS — N189 Chronic kidney disease, unspecified: Secondary | ICD-10-CM | POA: Diagnosis not present

## 2018-02-22 DIAGNOSIS — K210 Gastro-esophageal reflux disease with esophagitis: Secondary | ICD-10-CM | POA: Diagnosis not present

## 2018-02-22 DIAGNOSIS — E559 Vitamin D deficiency, unspecified: Secondary | ICD-10-CM | POA: Diagnosis not present

## 2018-02-22 DIAGNOSIS — E785 Hyperlipidemia, unspecified: Secondary | ICD-10-CM | POA: Diagnosis not present

## 2018-02-22 DIAGNOSIS — N189 Chronic kidney disease, unspecified: Secondary | ICD-10-CM | POA: Diagnosis not present

## 2018-02-22 DIAGNOSIS — B372 Candidiasis of skin and nail: Secondary | ICD-10-CM | POA: Diagnosis not present

## 2018-02-22 DIAGNOSIS — I509 Heart failure, unspecified: Secondary | ICD-10-CM | POA: Diagnosis not present

## 2018-02-22 DIAGNOSIS — E118 Type 2 diabetes mellitus with unspecified complications: Secondary | ICD-10-CM | POA: Diagnosis not present

## 2018-02-22 DIAGNOSIS — I2510 Atherosclerotic heart disease of native coronary artery without angina pectoris: Secondary | ICD-10-CM | POA: Diagnosis not present

## 2018-02-22 DIAGNOSIS — G459 Transient cerebral ischemic attack, unspecified: Secondary | ICD-10-CM | POA: Diagnosis not present

## 2018-03-21 DIAGNOSIS — Z794 Long term (current) use of insulin: Secondary | ICD-10-CM | POA: Diagnosis not present

## 2018-03-21 DIAGNOSIS — Z951 Presence of aortocoronary bypass graft: Secondary | ICD-10-CM | POA: Diagnosis not present

## 2018-03-21 DIAGNOSIS — E1159 Type 2 diabetes mellitus with other circulatory complications: Secondary | ICD-10-CM | POA: Diagnosis not present

## 2018-03-21 DIAGNOSIS — I10 Essential (primary) hypertension: Secondary | ICD-10-CM | POA: Diagnosis not present

## 2018-03-21 DIAGNOSIS — I739 Peripheral vascular disease, unspecified: Secondary | ICD-10-CM | POA: Diagnosis not present

## 2018-03-21 DIAGNOSIS — E785 Hyperlipidemia, unspecified: Secondary | ICD-10-CM | POA: Diagnosis not present

## 2018-04-18 DIAGNOSIS — G459 Transient cerebral ischemic attack, unspecified: Secondary | ICD-10-CM | POA: Diagnosis not present

## 2018-04-18 DIAGNOSIS — I2510 Atherosclerotic heart disease of native coronary artery without angina pectoris: Secondary | ICD-10-CM | POA: Diagnosis not present

## 2018-04-18 DIAGNOSIS — E118 Type 2 diabetes mellitus with unspecified complications: Secondary | ICD-10-CM | POA: Diagnosis not present

## 2018-04-18 DIAGNOSIS — E063 Autoimmune thyroiditis: Secondary | ICD-10-CM | POA: Diagnosis not present

## 2018-04-18 DIAGNOSIS — J449 Chronic obstructive pulmonary disease, unspecified: Secondary | ICD-10-CM | POA: Diagnosis not present

## 2018-04-18 DIAGNOSIS — E785 Hyperlipidemia, unspecified: Secondary | ICD-10-CM | POA: Diagnosis not present

## 2018-04-18 DIAGNOSIS — R69 Illness, unspecified: Secondary | ICD-10-CM | POA: Diagnosis not present

## 2018-04-18 DIAGNOSIS — Z6830 Body mass index (BMI) 30.0-30.9, adult: Secondary | ICD-10-CM | POA: Diagnosis not present

## 2018-05-02 DIAGNOSIS — R69 Illness, unspecified: Secondary | ICD-10-CM | POA: Diagnosis not present

## 2018-06-16 DIAGNOSIS — E1165 Type 2 diabetes mellitus with hyperglycemia: Secondary | ICD-10-CM | POA: Diagnosis not present

## 2018-06-16 DIAGNOSIS — E1136 Type 2 diabetes mellitus with diabetic cataract: Secondary | ICD-10-CM | POA: Diagnosis not present

## 2018-06-16 DIAGNOSIS — I509 Heart failure, unspecified: Secondary | ICD-10-CM | POA: Diagnosis not present

## 2018-06-16 DIAGNOSIS — I130 Hypertensive heart and chronic kidney disease with heart failure and stage 1 through stage 4 chronic kidney disease, or unspecified chronic kidney disease: Secondary | ICD-10-CM | POA: Diagnosis not present

## 2018-06-16 DIAGNOSIS — E1151 Type 2 diabetes mellitus with diabetic peripheral angiopathy without gangrene: Secondary | ICD-10-CM | POA: Diagnosis not present

## 2018-06-16 DIAGNOSIS — I25119 Atherosclerotic heart disease of native coronary artery with unspecified angina pectoris: Secondary | ICD-10-CM | POA: Diagnosis not present

## 2018-06-16 DIAGNOSIS — E1122 Type 2 diabetes mellitus with diabetic chronic kidney disease: Secondary | ICD-10-CM | POA: Diagnosis not present

## 2018-06-16 DIAGNOSIS — Z794 Long term (current) use of insulin: Secondary | ICD-10-CM | POA: Diagnosis not present

## 2018-06-16 DIAGNOSIS — E1142 Type 2 diabetes mellitus with diabetic polyneuropathy: Secondary | ICD-10-CM | POA: Diagnosis not present

## 2018-06-16 DIAGNOSIS — J449 Chronic obstructive pulmonary disease, unspecified: Secondary | ICD-10-CM | POA: Diagnosis not present

## 2018-07-20 DIAGNOSIS — I2510 Atherosclerotic heart disease of native coronary artery without angina pectoris: Secondary | ICD-10-CM | POA: Diagnosis not present

## 2018-07-20 DIAGNOSIS — G459 Transient cerebral ischemic attack, unspecified: Secondary | ICD-10-CM | POA: Diagnosis not present

## 2018-07-20 DIAGNOSIS — Z1231 Encounter for screening mammogram for malignant neoplasm of breast: Secondary | ICD-10-CM | POA: Diagnosis not present

## 2018-07-20 DIAGNOSIS — R69 Illness, unspecified: Secondary | ICD-10-CM | POA: Diagnosis not present

## 2018-07-20 DIAGNOSIS — R350 Frequency of micturition: Secondary | ICD-10-CM | POA: Diagnosis not present

## 2018-07-20 DIAGNOSIS — N390 Urinary tract infection, site not specified: Secondary | ICD-10-CM | POA: Diagnosis not present

## 2018-07-20 DIAGNOSIS — E063 Autoimmune thyroiditis: Secondary | ICD-10-CM | POA: Diagnosis not present

## 2018-07-20 DIAGNOSIS — J449 Chronic obstructive pulmonary disease, unspecified: Secondary | ICD-10-CM | POA: Diagnosis not present

## 2018-07-20 DIAGNOSIS — E118 Type 2 diabetes mellitus with unspecified complications: Secondary | ICD-10-CM | POA: Diagnosis not present

## 2018-07-20 DIAGNOSIS — E785 Hyperlipidemia, unspecified: Secondary | ICD-10-CM | POA: Diagnosis not present

## 2018-07-20 DIAGNOSIS — Z1339 Encounter for screening examination for other mental health and behavioral disorders: Secondary | ICD-10-CM | POA: Diagnosis not present

## 2018-07-25 DIAGNOSIS — Z1231 Encounter for screening mammogram for malignant neoplasm of breast: Secondary | ICD-10-CM | POA: Diagnosis not present

## 2018-10-13 DIAGNOSIS — I10 Essential (primary) hypertension: Secondary | ICD-10-CM | POA: Diagnosis not present

## 2018-10-13 DIAGNOSIS — I739 Peripheral vascular disease, unspecified: Secondary | ICD-10-CM | POA: Diagnosis not present

## 2018-10-13 DIAGNOSIS — J449 Chronic obstructive pulmonary disease, unspecified: Secondary | ICD-10-CM | POA: Insufficient documentation

## 2018-10-13 DIAGNOSIS — Z951 Presence of aortocoronary bypass graft: Secondary | ICD-10-CM | POA: Diagnosis not present

## 2018-10-13 DIAGNOSIS — E782 Mixed hyperlipidemia: Secondary | ICD-10-CM | POA: Diagnosis not present

## 2018-10-13 HISTORY — DX: Chronic obstructive pulmonary disease, unspecified: J44.9

## 2018-10-23 DIAGNOSIS — E785 Hyperlipidemia, unspecified: Secondary | ICD-10-CM | POA: Diagnosis not present

## 2018-10-23 DIAGNOSIS — R69 Illness, unspecified: Secondary | ICD-10-CM | POA: Diagnosis not present

## 2018-10-23 DIAGNOSIS — E063 Autoimmune thyroiditis: Secondary | ICD-10-CM | POA: Diagnosis not present

## 2018-10-23 DIAGNOSIS — R110 Nausea: Secondary | ICD-10-CM | POA: Diagnosis not present

## 2018-10-23 DIAGNOSIS — J449 Chronic obstructive pulmonary disease, unspecified: Secondary | ICD-10-CM | POA: Diagnosis not present

## 2018-10-23 DIAGNOSIS — I2510 Atherosclerotic heart disease of native coronary artery without angina pectoris: Secondary | ICD-10-CM | POA: Diagnosis not present

## 2018-10-23 DIAGNOSIS — E118 Type 2 diabetes mellitus with unspecified complications: Secondary | ICD-10-CM | POA: Diagnosis not present

## 2018-10-23 DIAGNOSIS — Z1331 Encounter for screening for depression: Secondary | ICD-10-CM | POA: Diagnosis not present

## 2018-10-23 DIAGNOSIS — Z9181 History of falling: Secondary | ICD-10-CM | POA: Diagnosis not present

## 2018-10-23 DIAGNOSIS — G459 Transient cerebral ischemic attack, unspecified: Secondary | ICD-10-CM | POA: Diagnosis not present

## 2018-10-23 DIAGNOSIS — Z23 Encounter for immunization: Secondary | ICD-10-CM | POA: Diagnosis not present

## 2018-11-30 DIAGNOSIS — R531 Weakness: Secondary | ICD-10-CM | POA: Diagnosis not present

## 2018-11-30 DIAGNOSIS — Z87891 Personal history of nicotine dependence: Secondary | ICD-10-CM | POA: Diagnosis not present

## 2018-11-30 DIAGNOSIS — Z7902 Long term (current) use of antithrombotics/antiplatelets: Secondary | ICD-10-CM | POA: Diagnosis not present

## 2018-11-30 DIAGNOSIS — Z7984 Long term (current) use of oral hypoglycemic drugs: Secondary | ICD-10-CM | POA: Diagnosis not present

## 2018-11-30 DIAGNOSIS — R1111 Vomiting without nausea: Secondary | ICD-10-CM | POA: Diagnosis not present

## 2018-11-30 DIAGNOSIS — R112 Nausea with vomiting, unspecified: Secondary | ICD-10-CM | POA: Diagnosis not present

## 2018-11-30 DIAGNOSIS — Z7952 Long term (current) use of systemic steroids: Secondary | ICD-10-CM | POA: Diagnosis not present

## 2018-11-30 DIAGNOSIS — E162 Hypoglycemia, unspecified: Secondary | ICD-10-CM | POA: Diagnosis not present

## 2018-11-30 DIAGNOSIS — E161 Other hypoglycemia: Secondary | ICD-10-CM | POA: Diagnosis not present

## 2018-11-30 DIAGNOSIS — Z794 Long term (current) use of insulin: Secondary | ICD-10-CM | POA: Diagnosis not present

## 2018-11-30 DIAGNOSIS — R110 Nausea: Secondary | ICD-10-CM | POA: Diagnosis not present

## 2019-01-08 DIAGNOSIS — Z79899 Other long term (current) drug therapy: Secondary | ICD-10-CM | POA: Diagnosis not present

## 2019-01-08 DIAGNOSIS — D649 Anemia, unspecified: Secondary | ICD-10-CM | POA: Diagnosis not present

## 2019-01-08 DIAGNOSIS — B372 Candidiasis of skin and nail: Secondary | ICD-10-CM | POA: Diagnosis not present

## 2019-01-08 DIAGNOSIS — R69 Illness, unspecified: Secondary | ICD-10-CM | POA: Diagnosis not present

## 2019-01-08 DIAGNOSIS — M1990 Unspecified osteoarthritis, unspecified site: Secondary | ICD-10-CM | POA: Diagnosis not present

## 2019-01-08 DIAGNOSIS — E118 Type 2 diabetes mellitus with unspecified complications: Secondary | ICD-10-CM | POA: Diagnosis not present

## 2019-01-08 DIAGNOSIS — Z6828 Body mass index (BMI) 28.0-28.9, adult: Secondary | ICD-10-CM | POA: Diagnosis not present

## 2019-01-12 DIAGNOSIS — G459 Transient cerebral ischemic attack, unspecified: Secondary | ICD-10-CM | POA: Diagnosis not present

## 2019-01-12 DIAGNOSIS — I639 Cerebral infarction, unspecified: Secondary | ICD-10-CM | POA: Diagnosis not present

## 2019-01-17 DIAGNOSIS — D631 Anemia in chronic kidney disease: Secondary | ICD-10-CM | POA: Diagnosis not present

## 2019-01-17 DIAGNOSIS — I69398 Other sequelae of cerebral infarction: Secondary | ICD-10-CM | POA: Diagnosis not present

## 2019-01-17 DIAGNOSIS — J45909 Unspecified asthma, uncomplicated: Secondary | ICD-10-CM | POA: Diagnosis not present

## 2019-01-17 DIAGNOSIS — I130 Hypertensive heart and chronic kidney disease with heart failure and stage 1 through stage 4 chronic kidney disease, or unspecified chronic kidney disease: Secondary | ICD-10-CM | POA: Diagnosis not present

## 2019-01-17 DIAGNOSIS — E11649 Type 2 diabetes mellitus with hypoglycemia without coma: Secondary | ICD-10-CM | POA: Diagnosis not present

## 2019-01-17 DIAGNOSIS — N189 Chronic kidney disease, unspecified: Secondary | ICD-10-CM | POA: Diagnosis not present

## 2019-01-17 DIAGNOSIS — I509 Heart failure, unspecified: Secondary | ICD-10-CM | POA: Diagnosis not present

## 2019-01-17 DIAGNOSIS — E1122 Type 2 diabetes mellitus with diabetic chronic kidney disease: Secondary | ICD-10-CM | POA: Diagnosis not present

## 2019-01-17 DIAGNOSIS — I69322 Dysarthria following cerebral infarction: Secondary | ICD-10-CM | POA: Diagnosis not present

## 2019-01-17 DIAGNOSIS — R2689 Other abnormalities of gait and mobility: Secondary | ICD-10-CM | POA: Diagnosis not present

## 2019-01-23 DIAGNOSIS — Z8673 Personal history of transient ischemic attack (TIA), and cerebral infarction without residual deficits: Secondary | ICD-10-CM | POA: Diagnosis not present

## 2019-01-23 DIAGNOSIS — E063 Autoimmune thyroiditis: Secondary | ICD-10-CM | POA: Diagnosis not present

## 2019-01-23 DIAGNOSIS — G459 Transient cerebral ischemic attack, unspecified: Secondary | ICD-10-CM | POA: Diagnosis not present

## 2019-01-23 DIAGNOSIS — Z6827 Body mass index (BMI) 27.0-27.9, adult: Secondary | ICD-10-CM | POA: Diagnosis not present

## 2019-01-23 DIAGNOSIS — E785 Hyperlipidemia, unspecified: Secondary | ICD-10-CM | POA: Diagnosis not present

## 2019-01-23 DIAGNOSIS — N3281 Overactive bladder: Secondary | ICD-10-CM | POA: Diagnosis not present

## 2019-01-23 DIAGNOSIS — I69322 Dysarthria following cerebral infarction: Secondary | ICD-10-CM | POA: Diagnosis not present

## 2019-01-23 DIAGNOSIS — E1122 Type 2 diabetes mellitus with diabetic chronic kidney disease: Secondary | ICD-10-CM | POA: Diagnosis not present

## 2019-01-23 DIAGNOSIS — R2689 Other abnormalities of gait and mobility: Secondary | ICD-10-CM | POA: Diagnosis not present

## 2019-01-23 DIAGNOSIS — J45909 Unspecified asthma, uncomplicated: Secondary | ICD-10-CM | POA: Diagnosis not present

## 2019-01-23 DIAGNOSIS — D631 Anemia in chronic kidney disease: Secondary | ICD-10-CM | POA: Diagnosis not present

## 2019-01-23 DIAGNOSIS — I130 Hypertensive heart and chronic kidney disease with heart failure and stage 1 through stage 4 chronic kidney disease, or unspecified chronic kidney disease: Secondary | ICD-10-CM | POA: Diagnosis not present

## 2019-01-23 DIAGNOSIS — N189 Chronic kidney disease, unspecified: Secondary | ICD-10-CM | POA: Diagnosis not present

## 2019-01-23 DIAGNOSIS — E11649 Type 2 diabetes mellitus with hypoglycemia without coma: Secondary | ICD-10-CM | POA: Diagnosis not present

## 2019-01-23 DIAGNOSIS — E118 Type 2 diabetes mellitus with unspecified complications: Secondary | ICD-10-CM | POA: Diagnosis not present

## 2019-01-23 DIAGNOSIS — I2510 Atherosclerotic heart disease of native coronary artery without angina pectoris: Secondary | ICD-10-CM | POA: Diagnosis not present

## 2019-01-23 DIAGNOSIS — I69398 Other sequelae of cerebral infarction: Secondary | ICD-10-CM | POA: Diagnosis not present

## 2019-01-23 DIAGNOSIS — I509 Heart failure, unspecified: Secondary | ICD-10-CM | POA: Diagnosis not present

## 2019-01-23 DIAGNOSIS — J449 Chronic obstructive pulmonary disease, unspecified: Secondary | ICD-10-CM | POA: Diagnosis not present

## 2019-01-26 DIAGNOSIS — G459 Transient cerebral ischemic attack, unspecified: Secondary | ICD-10-CM

## 2019-01-26 DIAGNOSIS — I639 Cerebral infarction, unspecified: Secondary | ICD-10-CM | POA: Diagnosis not present

## 2019-01-26 DIAGNOSIS — J449 Chronic obstructive pulmonary disease, unspecified: Secondary | ICD-10-CM

## 2019-01-26 DIAGNOSIS — I25810 Atherosclerosis of coronary artery bypass graft(s) without angina pectoris: Secondary | ICD-10-CM | POA: Diagnosis not present

## 2019-01-26 DIAGNOSIS — I252 Old myocardial infarction: Secondary | ICD-10-CM | POA: Diagnosis not present

## 2019-01-26 DIAGNOSIS — I2510 Atherosclerotic heart disease of native coronary artery without angina pectoris: Secondary | ICD-10-CM

## 2019-01-26 DIAGNOSIS — Z951 Presence of aortocoronary bypass graft: Secondary | ICD-10-CM

## 2019-01-26 DIAGNOSIS — E785 Hyperlipidemia, unspecified: Secondary | ICD-10-CM

## 2019-01-26 DIAGNOSIS — Z8673 Personal history of transient ischemic attack (TIA), and cerebral infarction without residual deficits: Secondary | ICD-10-CM

## 2019-01-26 DIAGNOSIS — I509 Heart failure, unspecified: Secondary | ICD-10-CM | POA: Diagnosis not present

## 2019-01-26 DIAGNOSIS — I160 Hypertensive urgency: Secondary | ICD-10-CM | POA: Diagnosis not present

## 2019-01-26 DIAGNOSIS — I10 Essential (primary) hypertension: Secondary | ICD-10-CM

## 2019-01-26 DIAGNOSIS — I69322 Dysarthria following cerebral infarction: Secondary | ICD-10-CM | POA: Diagnosis not present

## 2019-01-26 DIAGNOSIS — I6613 Occlusion and stenosis of bilateral anterior cerebral arteries: Secondary | ICD-10-CM | POA: Diagnosis not present

## 2019-01-26 DIAGNOSIS — I779 Disorder of arteries and arterioles, unspecified: Secondary | ICD-10-CM

## 2019-01-26 DIAGNOSIS — E119 Type 2 diabetes mellitus without complications: Secondary | ICD-10-CM | POA: Diagnosis not present

## 2019-01-26 DIAGNOSIS — R4781 Slurred speech: Secondary | ICD-10-CM | POA: Diagnosis not present

## 2019-01-26 DIAGNOSIS — N179 Acute kidney failure, unspecified: Secondary | ICD-10-CM

## 2019-01-26 DIAGNOSIS — E039 Hypothyroidism, unspecified: Secondary | ICD-10-CM | POA: Diagnosis not present

## 2019-01-26 DIAGNOSIS — R9431 Abnormal electrocardiogram [ECG] [EKG]: Secondary | ICD-10-CM | POA: Diagnosis not present

## 2019-01-30 DIAGNOSIS — E11649 Type 2 diabetes mellitus with hypoglycemia without coma: Secondary | ICD-10-CM | POA: Diagnosis not present

## 2019-01-30 DIAGNOSIS — E1122 Type 2 diabetes mellitus with diabetic chronic kidney disease: Secondary | ICD-10-CM | POA: Diagnosis not present

## 2019-01-30 DIAGNOSIS — E118 Type 2 diabetes mellitus with unspecified complications: Secondary | ICD-10-CM | POA: Diagnosis not present

## 2019-01-30 DIAGNOSIS — I69322 Dysarthria following cerebral infarction: Secondary | ICD-10-CM | POA: Diagnosis not present

## 2019-01-30 DIAGNOSIS — J449 Chronic obstructive pulmonary disease, unspecified: Secondary | ICD-10-CM | POA: Diagnosis not present

## 2019-01-30 DIAGNOSIS — I2510 Atherosclerotic heart disease of native coronary artery without angina pectoris: Secondary | ICD-10-CM | POA: Diagnosis not present

## 2019-01-30 DIAGNOSIS — I130 Hypertensive heart and chronic kidney disease with heart failure and stage 1 through stage 4 chronic kidney disease, or unspecified chronic kidney disease: Secondary | ICD-10-CM | POA: Diagnosis not present

## 2019-01-30 DIAGNOSIS — I69398 Other sequelae of cerebral infarction: Secondary | ICD-10-CM | POA: Diagnosis not present

## 2019-01-30 DIAGNOSIS — N189 Chronic kidney disease, unspecified: Secondary | ICD-10-CM | POA: Diagnosis not present

## 2019-01-30 DIAGNOSIS — D631 Anemia in chronic kidney disease: Secondary | ICD-10-CM | POA: Diagnosis not present

## 2019-01-30 DIAGNOSIS — J45909 Unspecified asthma, uncomplicated: Secondary | ICD-10-CM | POA: Diagnosis not present

## 2019-01-30 DIAGNOSIS — E063 Autoimmune thyroiditis: Secondary | ICD-10-CM | POA: Diagnosis not present

## 2019-01-30 DIAGNOSIS — Z79899 Other long term (current) drug therapy: Secondary | ICD-10-CM | POA: Diagnosis not present

## 2019-01-30 DIAGNOSIS — Z8673 Personal history of transient ischemic attack (TIA), and cerebral infarction without residual deficits: Secondary | ICD-10-CM | POA: Diagnosis not present

## 2019-01-30 DIAGNOSIS — I509 Heart failure, unspecified: Secondary | ICD-10-CM | POA: Diagnosis not present

## 2019-01-30 DIAGNOSIS — R2689 Other abnormalities of gait and mobility: Secondary | ICD-10-CM | POA: Diagnosis not present

## 2019-01-31 DIAGNOSIS — D631 Anemia in chronic kidney disease: Secondary | ICD-10-CM | POA: Diagnosis not present

## 2019-01-31 DIAGNOSIS — E1122 Type 2 diabetes mellitus with diabetic chronic kidney disease: Secondary | ICD-10-CM | POA: Diagnosis not present

## 2019-01-31 DIAGNOSIS — R2689 Other abnormalities of gait and mobility: Secondary | ICD-10-CM | POA: Diagnosis not present

## 2019-01-31 DIAGNOSIS — J45909 Unspecified asthma, uncomplicated: Secondary | ICD-10-CM | POA: Diagnosis not present

## 2019-01-31 DIAGNOSIS — I69398 Other sequelae of cerebral infarction: Secondary | ICD-10-CM | POA: Diagnosis not present

## 2019-01-31 DIAGNOSIS — I130 Hypertensive heart and chronic kidney disease with heart failure and stage 1 through stage 4 chronic kidney disease, or unspecified chronic kidney disease: Secondary | ICD-10-CM | POA: Diagnosis not present

## 2019-01-31 DIAGNOSIS — I509 Heart failure, unspecified: Secondary | ICD-10-CM | POA: Diagnosis not present

## 2019-01-31 DIAGNOSIS — E11649 Type 2 diabetes mellitus with hypoglycemia without coma: Secondary | ICD-10-CM | POA: Diagnosis not present

## 2019-01-31 DIAGNOSIS — I69322 Dysarthria following cerebral infarction: Secondary | ICD-10-CM | POA: Diagnosis not present

## 2019-01-31 DIAGNOSIS — N189 Chronic kidney disease, unspecified: Secondary | ICD-10-CM | POA: Diagnosis not present

## 2019-02-01 DIAGNOSIS — I69398 Other sequelae of cerebral infarction: Secondary | ICD-10-CM | POA: Diagnosis not present

## 2019-02-01 DIAGNOSIS — E1122 Type 2 diabetes mellitus with diabetic chronic kidney disease: Secondary | ICD-10-CM | POA: Diagnosis not present

## 2019-02-01 DIAGNOSIS — E11649 Type 2 diabetes mellitus with hypoglycemia without coma: Secondary | ICD-10-CM | POA: Diagnosis not present

## 2019-02-01 DIAGNOSIS — I130 Hypertensive heart and chronic kidney disease with heart failure and stage 1 through stage 4 chronic kidney disease, or unspecified chronic kidney disease: Secondary | ICD-10-CM | POA: Diagnosis not present

## 2019-02-01 DIAGNOSIS — N189 Chronic kidney disease, unspecified: Secondary | ICD-10-CM | POA: Diagnosis not present

## 2019-02-01 DIAGNOSIS — I509 Heart failure, unspecified: Secondary | ICD-10-CM | POA: Diagnosis not present

## 2019-02-01 DIAGNOSIS — I69322 Dysarthria following cerebral infarction: Secondary | ICD-10-CM | POA: Diagnosis not present

## 2019-02-01 DIAGNOSIS — D631 Anemia in chronic kidney disease: Secondary | ICD-10-CM | POA: Diagnosis not present

## 2019-02-01 DIAGNOSIS — R2689 Other abnormalities of gait and mobility: Secondary | ICD-10-CM | POA: Diagnosis not present

## 2019-02-01 DIAGNOSIS — J45909 Unspecified asthma, uncomplicated: Secondary | ICD-10-CM | POA: Diagnosis not present

## 2019-02-05 DIAGNOSIS — N189 Chronic kidney disease, unspecified: Secondary | ICD-10-CM | POA: Diagnosis not present

## 2019-02-05 DIAGNOSIS — I69322 Dysarthria following cerebral infarction: Secondary | ICD-10-CM | POA: Diagnosis not present

## 2019-02-05 DIAGNOSIS — E1122 Type 2 diabetes mellitus with diabetic chronic kidney disease: Secondary | ICD-10-CM | POA: Diagnosis not present

## 2019-02-05 DIAGNOSIS — D631 Anemia in chronic kidney disease: Secondary | ICD-10-CM | POA: Diagnosis not present

## 2019-02-05 DIAGNOSIS — I130 Hypertensive heart and chronic kidney disease with heart failure and stage 1 through stage 4 chronic kidney disease, or unspecified chronic kidney disease: Secondary | ICD-10-CM | POA: Diagnosis not present

## 2019-02-05 DIAGNOSIS — E11649 Type 2 diabetes mellitus with hypoglycemia without coma: Secondary | ICD-10-CM | POA: Diagnosis not present

## 2019-02-05 DIAGNOSIS — J45909 Unspecified asthma, uncomplicated: Secondary | ICD-10-CM | POA: Diagnosis not present

## 2019-02-05 DIAGNOSIS — I509 Heart failure, unspecified: Secondary | ICD-10-CM | POA: Diagnosis not present

## 2019-02-05 DIAGNOSIS — I69398 Other sequelae of cerebral infarction: Secondary | ICD-10-CM | POA: Diagnosis not present

## 2019-02-05 DIAGNOSIS — R2689 Other abnormalities of gait and mobility: Secondary | ICD-10-CM | POA: Diagnosis not present

## 2019-02-06 DIAGNOSIS — R69 Illness, unspecified: Secondary | ICD-10-CM | POA: Diagnosis not present

## 2019-02-07 DIAGNOSIS — I509 Heart failure, unspecified: Secondary | ICD-10-CM | POA: Diagnosis not present

## 2019-02-07 DIAGNOSIS — E1122 Type 2 diabetes mellitus with diabetic chronic kidney disease: Secondary | ICD-10-CM | POA: Diagnosis not present

## 2019-02-07 DIAGNOSIS — J45909 Unspecified asthma, uncomplicated: Secondary | ICD-10-CM | POA: Diagnosis not present

## 2019-02-07 DIAGNOSIS — R2689 Other abnormalities of gait and mobility: Secondary | ICD-10-CM | POA: Diagnosis not present

## 2019-02-07 DIAGNOSIS — I69322 Dysarthria following cerebral infarction: Secondary | ICD-10-CM | POA: Diagnosis not present

## 2019-02-07 DIAGNOSIS — N189 Chronic kidney disease, unspecified: Secondary | ICD-10-CM | POA: Diagnosis not present

## 2019-02-07 DIAGNOSIS — I130 Hypertensive heart and chronic kidney disease with heart failure and stage 1 through stage 4 chronic kidney disease, or unspecified chronic kidney disease: Secondary | ICD-10-CM | POA: Diagnosis not present

## 2019-02-07 DIAGNOSIS — I69398 Other sequelae of cerebral infarction: Secondary | ICD-10-CM | POA: Diagnosis not present

## 2019-02-07 DIAGNOSIS — E11649 Type 2 diabetes mellitus with hypoglycemia without coma: Secondary | ICD-10-CM | POA: Diagnosis not present

## 2019-02-07 DIAGNOSIS — D631 Anemia in chronic kidney disease: Secondary | ICD-10-CM | POA: Diagnosis not present

## 2019-02-08 DIAGNOSIS — E11649 Type 2 diabetes mellitus with hypoglycemia without coma: Secondary | ICD-10-CM | POA: Diagnosis not present

## 2019-02-08 DIAGNOSIS — D631 Anemia in chronic kidney disease: Secondary | ICD-10-CM | POA: Diagnosis not present

## 2019-02-08 DIAGNOSIS — I69398 Other sequelae of cerebral infarction: Secondary | ICD-10-CM | POA: Diagnosis not present

## 2019-02-08 DIAGNOSIS — I69322 Dysarthria following cerebral infarction: Secondary | ICD-10-CM | POA: Diagnosis not present

## 2019-02-08 DIAGNOSIS — J45909 Unspecified asthma, uncomplicated: Secondary | ICD-10-CM | POA: Diagnosis not present

## 2019-02-08 DIAGNOSIS — I130 Hypertensive heart and chronic kidney disease with heart failure and stage 1 through stage 4 chronic kidney disease, or unspecified chronic kidney disease: Secondary | ICD-10-CM | POA: Diagnosis not present

## 2019-02-08 DIAGNOSIS — N189 Chronic kidney disease, unspecified: Secondary | ICD-10-CM | POA: Diagnosis not present

## 2019-02-08 DIAGNOSIS — R2689 Other abnormalities of gait and mobility: Secondary | ICD-10-CM | POA: Diagnosis not present

## 2019-02-08 DIAGNOSIS — E1122 Type 2 diabetes mellitus with diabetic chronic kidney disease: Secondary | ICD-10-CM | POA: Diagnosis not present

## 2019-02-08 DIAGNOSIS — I509 Heart failure, unspecified: Secondary | ICD-10-CM | POA: Diagnosis not present

## 2019-02-09 DIAGNOSIS — I69322 Dysarthria following cerebral infarction: Secondary | ICD-10-CM | POA: Diagnosis not present

## 2019-02-09 DIAGNOSIS — J45909 Unspecified asthma, uncomplicated: Secondary | ICD-10-CM | POA: Diagnosis not present

## 2019-02-09 DIAGNOSIS — I130 Hypertensive heart and chronic kidney disease with heart failure and stage 1 through stage 4 chronic kidney disease, or unspecified chronic kidney disease: Secondary | ICD-10-CM | POA: Diagnosis not present

## 2019-02-09 DIAGNOSIS — I69398 Other sequelae of cerebral infarction: Secondary | ICD-10-CM | POA: Diagnosis not present

## 2019-02-09 DIAGNOSIS — R2689 Other abnormalities of gait and mobility: Secondary | ICD-10-CM | POA: Diagnosis not present

## 2019-02-09 DIAGNOSIS — I509 Heart failure, unspecified: Secondary | ICD-10-CM | POA: Diagnosis not present

## 2019-02-09 DIAGNOSIS — E11649 Type 2 diabetes mellitus with hypoglycemia without coma: Secondary | ICD-10-CM | POA: Diagnosis not present

## 2019-02-09 DIAGNOSIS — E1122 Type 2 diabetes mellitus with diabetic chronic kidney disease: Secondary | ICD-10-CM | POA: Diagnosis not present

## 2019-02-09 DIAGNOSIS — D631 Anemia in chronic kidney disease: Secondary | ICD-10-CM | POA: Diagnosis not present

## 2019-02-09 DIAGNOSIS — N189 Chronic kidney disease, unspecified: Secondary | ICD-10-CM | POA: Diagnosis not present

## 2019-02-12 DIAGNOSIS — E11649 Type 2 diabetes mellitus with hypoglycemia without coma: Secondary | ICD-10-CM | POA: Diagnosis not present

## 2019-02-12 DIAGNOSIS — J45909 Unspecified asthma, uncomplicated: Secondary | ICD-10-CM | POA: Diagnosis not present

## 2019-02-12 DIAGNOSIS — I130 Hypertensive heart and chronic kidney disease with heart failure and stage 1 through stage 4 chronic kidney disease, or unspecified chronic kidney disease: Secondary | ICD-10-CM | POA: Diagnosis not present

## 2019-02-12 DIAGNOSIS — I509 Heart failure, unspecified: Secondary | ICD-10-CM | POA: Diagnosis not present

## 2019-02-12 DIAGNOSIS — D631 Anemia in chronic kidney disease: Secondary | ICD-10-CM | POA: Diagnosis not present

## 2019-02-12 DIAGNOSIS — N189 Chronic kidney disease, unspecified: Secondary | ICD-10-CM | POA: Diagnosis not present

## 2019-02-12 DIAGNOSIS — I69322 Dysarthria following cerebral infarction: Secondary | ICD-10-CM | POA: Diagnosis not present

## 2019-02-12 DIAGNOSIS — E1122 Type 2 diabetes mellitus with diabetic chronic kidney disease: Secondary | ICD-10-CM | POA: Diagnosis not present

## 2019-02-12 DIAGNOSIS — I69398 Other sequelae of cerebral infarction: Secondary | ICD-10-CM | POA: Diagnosis not present

## 2019-02-12 DIAGNOSIS — R2689 Other abnormalities of gait and mobility: Secondary | ICD-10-CM | POA: Diagnosis not present

## 2019-02-14 DIAGNOSIS — E11649 Type 2 diabetes mellitus with hypoglycemia without coma: Secondary | ICD-10-CM | POA: Diagnosis not present

## 2019-02-14 DIAGNOSIS — J45909 Unspecified asthma, uncomplicated: Secondary | ICD-10-CM | POA: Diagnosis not present

## 2019-02-14 DIAGNOSIS — N189 Chronic kidney disease, unspecified: Secondary | ICD-10-CM | POA: Diagnosis not present

## 2019-02-14 DIAGNOSIS — I509 Heart failure, unspecified: Secondary | ICD-10-CM | POA: Diagnosis not present

## 2019-02-14 DIAGNOSIS — D631 Anemia in chronic kidney disease: Secondary | ICD-10-CM | POA: Diagnosis not present

## 2019-02-14 DIAGNOSIS — R2689 Other abnormalities of gait and mobility: Secondary | ICD-10-CM | POA: Diagnosis not present

## 2019-02-14 DIAGNOSIS — I69322 Dysarthria following cerebral infarction: Secondary | ICD-10-CM | POA: Diagnosis not present

## 2019-02-14 DIAGNOSIS — E1122 Type 2 diabetes mellitus with diabetic chronic kidney disease: Secondary | ICD-10-CM | POA: Diagnosis not present

## 2019-02-14 DIAGNOSIS — I130 Hypertensive heart and chronic kidney disease with heart failure and stage 1 through stage 4 chronic kidney disease, or unspecified chronic kidney disease: Secondary | ICD-10-CM | POA: Diagnosis not present

## 2019-02-14 DIAGNOSIS — I69398 Other sequelae of cerebral infarction: Secondary | ICD-10-CM | POA: Diagnosis not present

## 2019-02-15 ENCOUNTER — Telehealth: Payer: Self-pay | Admitting: Emergency Medicine

## 2019-02-15 DIAGNOSIS — I509 Heart failure, unspecified: Secondary | ICD-10-CM | POA: Diagnosis not present

## 2019-02-15 DIAGNOSIS — R2689 Other abnormalities of gait and mobility: Secondary | ICD-10-CM | POA: Diagnosis not present

## 2019-02-15 DIAGNOSIS — I69322 Dysarthria following cerebral infarction: Secondary | ICD-10-CM | POA: Diagnosis not present

## 2019-02-15 DIAGNOSIS — E1122 Type 2 diabetes mellitus with diabetic chronic kidney disease: Secondary | ICD-10-CM | POA: Diagnosis not present

## 2019-02-15 DIAGNOSIS — I130 Hypertensive heart and chronic kidney disease with heart failure and stage 1 through stage 4 chronic kidney disease, or unspecified chronic kidney disease: Secondary | ICD-10-CM | POA: Diagnosis not present

## 2019-02-15 DIAGNOSIS — E11649 Type 2 diabetes mellitus with hypoglycemia without coma: Secondary | ICD-10-CM | POA: Diagnosis not present

## 2019-02-15 DIAGNOSIS — D631 Anemia in chronic kidney disease: Secondary | ICD-10-CM | POA: Diagnosis not present

## 2019-02-15 DIAGNOSIS — N189 Chronic kidney disease, unspecified: Secondary | ICD-10-CM | POA: Diagnosis not present

## 2019-02-15 DIAGNOSIS — J45909 Unspecified asthma, uncomplicated: Secondary | ICD-10-CM | POA: Diagnosis not present

## 2019-02-15 DIAGNOSIS — I69398 Other sequelae of cerebral infarction: Secondary | ICD-10-CM | POA: Diagnosis not present

## 2019-02-15 NOTE — Telephone Encounter (Signed)
Called patient and asked the screening questions for COVID 19. Patient denies being out of the country in the last 14 days. Patient denies being around anyone with COVID 19. Patient denies and cough, shortness of breath, or fever. Patient will be seen as scheduled.

## 2019-02-16 ENCOUNTER — Ambulatory Visit: Payer: Self-pay | Admitting: Cardiology

## 2019-02-16 ENCOUNTER — Encounter: Payer: Self-pay | Admitting: Cardiology

## 2019-02-16 ENCOUNTER — Telehealth: Payer: Self-pay | Admitting: Cardiology

## 2019-02-16 NOTE — Telephone Encounter (Signed)
Patient appointment canceled today per Dr. Agustin Cree. We will follow up with patient after receiving [place] cardiology records. Patient verbally understands.

## 2019-02-19 DIAGNOSIS — I130 Hypertensive heart and chronic kidney disease with heart failure and stage 1 through stage 4 chronic kidney disease, or unspecified chronic kidney disease: Secondary | ICD-10-CM | POA: Diagnosis not present

## 2019-02-19 DIAGNOSIS — R2689 Other abnormalities of gait and mobility: Secondary | ICD-10-CM | POA: Diagnosis not present

## 2019-02-19 DIAGNOSIS — I69398 Other sequelae of cerebral infarction: Secondary | ICD-10-CM | POA: Diagnosis not present

## 2019-02-19 DIAGNOSIS — N189 Chronic kidney disease, unspecified: Secondary | ICD-10-CM | POA: Diagnosis not present

## 2019-02-19 DIAGNOSIS — J45909 Unspecified asthma, uncomplicated: Secondary | ICD-10-CM | POA: Diagnosis not present

## 2019-02-19 DIAGNOSIS — I509 Heart failure, unspecified: Secondary | ICD-10-CM | POA: Diagnosis not present

## 2019-02-19 DIAGNOSIS — I69322 Dysarthria following cerebral infarction: Secondary | ICD-10-CM | POA: Diagnosis not present

## 2019-02-19 DIAGNOSIS — D631 Anemia in chronic kidney disease: Secondary | ICD-10-CM | POA: Diagnosis not present

## 2019-02-19 DIAGNOSIS — E11649 Type 2 diabetes mellitus with hypoglycemia without coma: Secondary | ICD-10-CM | POA: Diagnosis not present

## 2019-02-19 DIAGNOSIS — E1122 Type 2 diabetes mellitus with diabetic chronic kidney disease: Secondary | ICD-10-CM | POA: Diagnosis not present

## 2019-02-20 DIAGNOSIS — I69398 Other sequelae of cerebral infarction: Secondary | ICD-10-CM | POA: Diagnosis not present

## 2019-02-20 DIAGNOSIS — D631 Anemia in chronic kidney disease: Secondary | ICD-10-CM | POA: Diagnosis not present

## 2019-02-20 DIAGNOSIS — I69322 Dysarthria following cerebral infarction: Secondary | ICD-10-CM | POA: Diagnosis not present

## 2019-02-20 DIAGNOSIS — E11649 Type 2 diabetes mellitus with hypoglycemia without coma: Secondary | ICD-10-CM | POA: Diagnosis not present

## 2019-02-20 DIAGNOSIS — J45909 Unspecified asthma, uncomplicated: Secondary | ICD-10-CM | POA: Diagnosis not present

## 2019-02-20 DIAGNOSIS — I130 Hypertensive heart and chronic kidney disease with heart failure and stage 1 through stage 4 chronic kidney disease, or unspecified chronic kidney disease: Secondary | ICD-10-CM | POA: Diagnosis not present

## 2019-02-20 DIAGNOSIS — E1122 Type 2 diabetes mellitus with diabetic chronic kidney disease: Secondary | ICD-10-CM | POA: Diagnosis not present

## 2019-02-20 DIAGNOSIS — R2689 Other abnormalities of gait and mobility: Secondary | ICD-10-CM | POA: Diagnosis not present

## 2019-02-20 DIAGNOSIS — N189 Chronic kidney disease, unspecified: Secondary | ICD-10-CM | POA: Diagnosis not present

## 2019-02-20 DIAGNOSIS — I509 Heart failure, unspecified: Secondary | ICD-10-CM | POA: Diagnosis not present

## 2019-02-21 DIAGNOSIS — I509 Heart failure, unspecified: Secondary | ICD-10-CM | POA: Diagnosis not present

## 2019-02-21 DIAGNOSIS — D631 Anemia in chronic kidney disease: Secondary | ICD-10-CM | POA: Diagnosis not present

## 2019-02-21 DIAGNOSIS — I69398 Other sequelae of cerebral infarction: Secondary | ICD-10-CM | POA: Diagnosis not present

## 2019-02-21 DIAGNOSIS — N189 Chronic kidney disease, unspecified: Secondary | ICD-10-CM | POA: Diagnosis not present

## 2019-02-21 DIAGNOSIS — J45909 Unspecified asthma, uncomplicated: Secondary | ICD-10-CM | POA: Diagnosis not present

## 2019-02-21 DIAGNOSIS — I130 Hypertensive heart and chronic kidney disease with heart failure and stage 1 through stage 4 chronic kidney disease, or unspecified chronic kidney disease: Secondary | ICD-10-CM | POA: Diagnosis not present

## 2019-02-21 DIAGNOSIS — E11649 Type 2 diabetes mellitus with hypoglycemia without coma: Secondary | ICD-10-CM | POA: Diagnosis not present

## 2019-02-21 DIAGNOSIS — E1122 Type 2 diabetes mellitus with diabetic chronic kidney disease: Secondary | ICD-10-CM | POA: Diagnosis not present

## 2019-02-21 DIAGNOSIS — I69322 Dysarthria following cerebral infarction: Secondary | ICD-10-CM | POA: Diagnosis not present

## 2019-02-21 DIAGNOSIS — R2689 Other abnormalities of gait and mobility: Secondary | ICD-10-CM | POA: Diagnosis not present

## 2019-02-26 DIAGNOSIS — J45909 Unspecified asthma, uncomplicated: Secondary | ICD-10-CM | POA: Diagnosis not present

## 2019-02-26 DIAGNOSIS — I130 Hypertensive heart and chronic kidney disease with heart failure and stage 1 through stage 4 chronic kidney disease, or unspecified chronic kidney disease: Secondary | ICD-10-CM | POA: Diagnosis not present

## 2019-02-26 DIAGNOSIS — E1122 Type 2 diabetes mellitus with diabetic chronic kidney disease: Secondary | ICD-10-CM | POA: Diagnosis not present

## 2019-02-26 DIAGNOSIS — N189 Chronic kidney disease, unspecified: Secondary | ICD-10-CM | POA: Diagnosis not present

## 2019-02-26 DIAGNOSIS — I509 Heart failure, unspecified: Secondary | ICD-10-CM | POA: Diagnosis not present

## 2019-02-26 DIAGNOSIS — D631 Anemia in chronic kidney disease: Secondary | ICD-10-CM | POA: Diagnosis not present

## 2019-02-26 DIAGNOSIS — E11649 Type 2 diabetes mellitus with hypoglycemia without coma: Secondary | ICD-10-CM | POA: Diagnosis not present

## 2019-02-26 DIAGNOSIS — I69322 Dysarthria following cerebral infarction: Secondary | ICD-10-CM | POA: Diagnosis not present

## 2019-02-26 DIAGNOSIS — I69398 Other sequelae of cerebral infarction: Secondary | ICD-10-CM | POA: Diagnosis not present

## 2019-02-26 DIAGNOSIS — R2689 Other abnormalities of gait and mobility: Secondary | ICD-10-CM | POA: Diagnosis not present

## 2019-03-09 ENCOUNTER — Telehealth: Payer: Self-pay | Admitting: Cardiology

## 2019-03-09 NOTE — Telephone Encounter (Signed)
Cardiac Questionnaire:    Since your last visit or hospitalization:    1. Have you been having new or worsening chest pain? no   2. Have you been having new or worsening shortness of breath? no 3. Have you been having new or worsening leg swelling, wt gain, or increase in abdominal girth (pants fitting more tightly)? no   4. Have you had any passing out spells? no    *A YES to any of these questions would result in the appointment being kept. *If all the answers to these questions are NO, we should indicate that given the current situation regarding the worldwide coronarvirus pandemic, at the recommendation of the CDC, we are looking to limit gatherings in our waiting area, and thus will reschedule their appointment beyond four weeks from today.   _____________   OYDXA-12 Pre-Screening Questions:   Do you currently have a fever? no (yes = cancel and refer to pcp for e-visit)  Have you recently travelled on a cruise, internationally, or to [place], [place], [place], [place], [place], or [place], [place]) ? no (yes = cancel, stay home, monitor symptoms, and contact pcp or initiate e-visit if symptoms develop)  Have you been in contact with someone that is currently pending confirmation of Covid19 testing or has been confirmed to have the [place] virus?  no (yes = cancel, stay home, away from tested individual, monitor symptoms, and contact pcp or initiate e-visit if symptoms develop)  Are you currently experiencing fatigue or cough? no (yes = pt should be prepared to have a mask placed at the time of their visit).      Virtual Visit Pre-Appointment Phone Call  Steps For Call:  1. Confirm consent - "In the setting of the current Covid19 crisis, you are scheduled for a (phone or video) visit with your provider on (date) at (time).  Just as we do with many in-office visits, in order for you to participate in this visit, we must obtain consent.  If you'd like, I can send this to your mychart (if  signed up) or email for you to review.  Otherwise, I can obtain your verbal consent now.  All virtual visits are billed to your insurance company just like a normal visit would be.  By agreeing to a virtual visit, we'd like you to understand that the technology does not allow for your provider to perform an examination, and thus may limit your provider's ability to fully assess your condition.  Finally, though the technology is pretty good, we cannot assure that it will always work on either your or our end, and in the setting of a video visit, we may have to convert it to a phone-only visit.  In either situation, we cannot ensure that we have a secure connection.  Are you willing to proceed?"  2. Give patient instructions for WebEx download to smartphone as below if video visit  3. Advise patient to be prepared with any vital sign or heart rhythm information, their current medicines, and a piece of paper and pen handy for any instructions they may receive the day of their visit  4. Inform patient they will receive a phone call 15 minutes prior to their appointment time (may be from unknown caller ID) so they should be prepared to answer  5. Confirm that appointment type is correct in Epic appointment notes (video vs telephone)    TELEPHONE CALL NOTE  [place] has been deemed a candidate for a follow-up tele-health  visit to limit community exposure during the Covid-19 pandemic. I spoke with the patient via phone to ensure availability of phone/video source, confirm preferred email & phone number, and discuss instructions and expectations.  I reminded Zamyia I Sunderlin to be prepared with any vital sign and/or heart rhythm information that could potentially be obtained via home monitoring, at the time of her visit. I reminded Justise I Bischoff to expect a phone call at the time of her visit if her visit.  Did the patient verbally acknowledge consent to treatment? yes  Isaiah Blakes 03/09/2019 2:36 PM   DOWNLOADING THE [place], go to [employer] and type in WebEx in the search bar. [place] [employer], the blue/green circle. The app is free but as with any other app downloads, their phone may require them to verify saved payment information or Apple password. The patient does NOT have to create an account.  - If Android, ask patient to go to [employer] and type in WebEx in the search bar. [place] [employer], the blue/green circle. The app is free but as with any other app downloads, their phone may require them to verify saved payment information or Android password. The patient does NOT have to create an account.   CONSENT FOR TELE-HEALTH VISIT - PLEASE REVIEW  I hereby voluntarily request, consent and authorize [facility] and its employed or contracted physicians, physician assistants, nurse practitioners or other licensed health care professionals (the Practitioner), to provide me with telemedicine health care services (the Services") as deemed necessary by the treating Practitioner. I acknowledge and consent to receive the Services by the Practitioner via telemedicine. I understand that the telemedicine visit will involve communicating with the Practitioner through live audiovisual communication technology and the disclosure of certain medical information by electronic transmission. I acknowledge that I have been given the opportunity to request an in-person assessment or other available alternative prior to the telemedicine visit and am voluntarily participating in the telemedicine visit.  I understand that I have the right to withhold or withdraw my consent to the use of telemedicine in the course of my care at any time, without affecting my right to future care or treatment, and that the Practitioner or I may terminate the telemedicine visit at any time. I understand that I have the right to inspect  all information obtained and/or recorded in the course of the telemedicine visit and may receive copies of available information for a reasonable fee.  I understand that some of the potential risks of receiving the Services via telemedicine include:   Delay or interruption in medical evaluation due to technological equipment failure or disruption;  Information transmitted may not be sufficient (e.g. poor resolution of images) to allow for appropriate medical decision making by the Practitioner; and/or   In rare instances, security protocols could fail, causing a breach of personal health information.  Furthermore, I acknowledge that it is my responsibility to provide information about my medical history, conditions and care that is complete and accurate to the best of my ability. I acknowledge that Practitioner's advice, recommendations, and/or decision may be based on factors not within their control, such as incomplete or inaccurate data provided by me or distortions of diagnostic images or specimens that may result from electronic transmissions. I understand that the practice of medicine is not an exact science and that Practitioner makes no warranties or guarantees regarding treatment outcomes. I acknowledge that I  will receive a copy of this consent concurrently upon execution via email to the email address I last provided but may also request a printed copy by calling the office of [facility].    I understand that my insurance will be billed for this visit.   I have read or had this consent read to me.  I understand the contents of this consent, which adequately explains the benefits and risks of the Services being provided via telemedicine.   I have been provided ample opportunity to ask questions regarding this consent and the Services and have had my questions answered to my satisfaction.  I give my informed consent for the services to be provided through the use of telemedicine in my  medical care  By participating in this telemedicine visit I agree to the above.

## 2019-03-14 ENCOUNTER — Telehealth: Payer: Self-pay | Admitting: Cardiology

## 2019-04-16 DIAGNOSIS — Z7982 Long term (current) use of aspirin: Secondary | ICD-10-CM

## 2019-04-16 DIAGNOSIS — I2510 Atherosclerotic heart disease of native coronary artery without angina pectoris: Secondary | ICD-10-CM

## 2019-04-16 HISTORY — DX: Atherosclerotic heart disease of native coronary artery without angina pectoris: I25.10

## 2019-04-16 HISTORY — DX: Long term (current) use of aspirin: Z79.82

## 2019-04-17 DIAGNOSIS — E782 Mixed hyperlipidemia: Secondary | ICD-10-CM | POA: Diagnosis not present

## 2019-04-17 DIAGNOSIS — Z951 Presence of aortocoronary bypass graft: Secondary | ICD-10-CM | POA: Diagnosis not present

## 2019-04-17 DIAGNOSIS — I10 Essential (primary) hypertension: Secondary | ICD-10-CM | POA: Diagnosis not present

## 2019-04-17 DIAGNOSIS — Z7982 Long term (current) use of aspirin: Secondary | ICD-10-CM | POA: Diagnosis not present

## 2019-04-17 DIAGNOSIS — I2510 Atherosclerotic heart disease of native coronary artery without angina pectoris: Secondary | ICD-10-CM | POA: Diagnosis not present

## 2019-04-26 DIAGNOSIS — N189 Chronic kidney disease, unspecified: Secondary | ICD-10-CM | POA: Diagnosis not present

## 2019-04-26 DIAGNOSIS — I2510 Atherosclerotic heart disease of native coronary artery without angina pectoris: Secondary | ICD-10-CM | POA: Diagnosis not present

## 2019-04-26 DIAGNOSIS — E118 Type 2 diabetes mellitus with unspecified complications: Secondary | ICD-10-CM | POA: Diagnosis not present

## 2019-04-26 DIAGNOSIS — E063 Autoimmune thyroiditis: Secondary | ICD-10-CM | POA: Diagnosis not present

## 2019-04-26 DIAGNOSIS — J449 Chronic obstructive pulmonary disease, unspecified: Secondary | ICD-10-CM | POA: Diagnosis not present

## 2019-04-26 DIAGNOSIS — K210 Gastro-esophageal reflux disease with esophagitis: Secondary | ICD-10-CM | POA: Diagnosis not present

## 2019-04-26 DIAGNOSIS — D649 Anemia, unspecified: Secondary | ICD-10-CM | POA: Diagnosis not present

## 2019-04-26 DIAGNOSIS — I509 Heart failure, unspecified: Secondary | ICD-10-CM | POA: Diagnosis not present

## 2019-04-26 DIAGNOSIS — E559 Vitamin D deficiency, unspecified: Secondary | ICD-10-CM | POA: Diagnosis not present

## 2019-04-26 DIAGNOSIS — E785 Hyperlipidemia, unspecified: Secondary | ICD-10-CM | POA: Diagnosis not present

## 2019-07-30 DIAGNOSIS — E118 Type 2 diabetes mellitus with unspecified complications: Secondary | ICD-10-CM | POA: Diagnosis not present

## 2019-07-30 DIAGNOSIS — N3281 Overactive bladder: Secondary | ICD-10-CM | POA: Diagnosis not present

## 2019-07-30 DIAGNOSIS — N189 Chronic kidney disease, unspecified: Secondary | ICD-10-CM | POA: Diagnosis not present

## 2019-07-30 DIAGNOSIS — M545 Low back pain: Secondary | ICD-10-CM | POA: Diagnosis not present

## 2019-07-30 DIAGNOSIS — I2510 Atherosclerotic heart disease of native coronary artery without angina pectoris: Secondary | ICD-10-CM | POA: Diagnosis not present

## 2019-07-30 DIAGNOSIS — K210 Gastro-esophageal reflux disease with esophagitis: Secondary | ICD-10-CM | POA: Diagnosis not present

## 2019-07-30 DIAGNOSIS — I509 Heart failure, unspecified: Secondary | ICD-10-CM | POA: Diagnosis not present

## 2019-07-30 DIAGNOSIS — E785 Hyperlipidemia, unspecified: Secondary | ICD-10-CM | POA: Diagnosis not present

## 2019-07-30 DIAGNOSIS — E063 Autoimmune thyroiditis: Secondary | ICD-10-CM | POA: Diagnosis not present

## 2019-07-30 DIAGNOSIS — D649 Anemia, unspecified: Secondary | ICD-10-CM | POA: Diagnosis not present

## 2019-07-30 DIAGNOSIS — L309 Dermatitis, unspecified: Secondary | ICD-10-CM | POA: Diagnosis not present

## 2019-08-01 DIAGNOSIS — J45909 Unspecified asthma, uncomplicated: Secondary | ICD-10-CM | POA: Diagnosis not present

## 2019-08-01 DIAGNOSIS — I509 Heart failure, unspecified: Secondary | ICD-10-CM | POA: Diagnosis not present

## 2019-08-01 DIAGNOSIS — E1122 Type 2 diabetes mellitus with diabetic chronic kidney disease: Secondary | ICD-10-CM | POA: Diagnosis not present

## 2019-08-01 DIAGNOSIS — E559 Vitamin D deficiency, unspecified: Secondary | ICD-10-CM | POA: Diagnosis not present

## 2019-08-01 DIAGNOSIS — D631 Anemia in chronic kidney disease: Secondary | ICD-10-CM | POA: Diagnosis not present

## 2019-08-01 DIAGNOSIS — M810 Age-related osteoporosis without current pathological fracture: Secondary | ICD-10-CM | POA: Diagnosis not present

## 2019-08-01 DIAGNOSIS — G4700 Insomnia, unspecified: Secondary | ICD-10-CM | POA: Diagnosis not present

## 2019-08-01 DIAGNOSIS — M1991 Primary osteoarthritis, unspecified site: Secondary | ICD-10-CM | POA: Diagnosis not present

## 2019-08-01 DIAGNOSIS — I2510 Atherosclerotic heart disease of native coronary artery without angina pectoris: Secondary | ICD-10-CM | POA: Diagnosis not present

## 2019-08-01 DIAGNOSIS — I130 Hypertensive heart and chronic kidney disease with heart failure and stage 1 through stage 4 chronic kidney disease, or unspecified chronic kidney disease: Secondary | ICD-10-CM | POA: Diagnosis not present

## 2019-08-13 DIAGNOSIS — M810 Age-related osteoporosis without current pathological fracture: Secondary | ICD-10-CM | POA: Diagnosis not present

## 2019-08-13 DIAGNOSIS — E559 Vitamin D deficiency, unspecified: Secondary | ICD-10-CM | POA: Diagnosis not present

## 2019-08-13 DIAGNOSIS — I2510 Atherosclerotic heart disease of native coronary artery without angina pectoris: Secondary | ICD-10-CM | POA: Diagnosis not present

## 2019-08-13 DIAGNOSIS — J45909 Unspecified asthma, uncomplicated: Secondary | ICD-10-CM | POA: Diagnosis not present

## 2019-08-13 DIAGNOSIS — I130 Hypertensive heart and chronic kidney disease with heart failure and stage 1 through stage 4 chronic kidney disease, or unspecified chronic kidney disease: Secondary | ICD-10-CM | POA: Diagnosis not present

## 2019-08-13 DIAGNOSIS — E1122 Type 2 diabetes mellitus with diabetic chronic kidney disease: Secondary | ICD-10-CM | POA: Diagnosis not present

## 2019-08-13 DIAGNOSIS — I509 Heart failure, unspecified: Secondary | ICD-10-CM | POA: Diagnosis not present

## 2019-08-13 DIAGNOSIS — D631 Anemia in chronic kidney disease: Secondary | ICD-10-CM | POA: Diagnosis not present

## 2019-08-13 DIAGNOSIS — M1991 Primary osteoarthritis, unspecified site: Secondary | ICD-10-CM | POA: Diagnosis not present

## 2019-08-13 DIAGNOSIS — G4700 Insomnia, unspecified: Secondary | ICD-10-CM | POA: Diagnosis not present

## 2019-08-15 DIAGNOSIS — Z1231 Encounter for screening mammogram for malignant neoplasm of breast: Secondary | ICD-10-CM | POA: Diagnosis not present

## 2019-08-22 DIAGNOSIS — I130 Hypertensive heart and chronic kidney disease with heart failure and stage 1 through stage 4 chronic kidney disease, or unspecified chronic kidney disease: Secondary | ICD-10-CM | POA: Diagnosis not present

## 2019-08-22 DIAGNOSIS — J45909 Unspecified asthma, uncomplicated: Secondary | ICD-10-CM | POA: Diagnosis not present

## 2019-08-22 DIAGNOSIS — D631 Anemia in chronic kidney disease: Secondary | ICD-10-CM | POA: Diagnosis not present

## 2019-08-22 DIAGNOSIS — I2510 Atherosclerotic heart disease of native coronary artery without angina pectoris: Secondary | ICD-10-CM | POA: Diagnosis not present

## 2019-08-22 DIAGNOSIS — G4700 Insomnia, unspecified: Secondary | ICD-10-CM | POA: Diagnosis not present

## 2019-08-22 DIAGNOSIS — I509 Heart failure, unspecified: Secondary | ICD-10-CM | POA: Diagnosis not present

## 2019-08-22 DIAGNOSIS — M810 Age-related osteoporosis without current pathological fracture: Secondary | ICD-10-CM | POA: Diagnosis not present

## 2019-08-22 DIAGNOSIS — E559 Vitamin D deficiency, unspecified: Secondary | ICD-10-CM | POA: Diagnosis not present

## 2019-08-22 DIAGNOSIS — M1991 Primary osteoarthritis, unspecified site: Secondary | ICD-10-CM | POA: Diagnosis not present

## 2019-08-22 DIAGNOSIS — E1122 Type 2 diabetes mellitus with diabetic chronic kidney disease: Secondary | ICD-10-CM | POA: Diagnosis not present

## 2019-08-27 DIAGNOSIS — I2510 Atherosclerotic heart disease of native coronary artery without angina pectoris: Secondary | ICD-10-CM | POA: Diagnosis not present

## 2019-08-27 DIAGNOSIS — M1991 Primary osteoarthritis, unspecified site: Secondary | ICD-10-CM | POA: Diagnosis not present

## 2019-08-27 DIAGNOSIS — E559 Vitamin D deficiency, unspecified: Secondary | ICD-10-CM | POA: Diagnosis not present

## 2019-08-27 DIAGNOSIS — I509 Heart failure, unspecified: Secondary | ICD-10-CM | POA: Diagnosis not present

## 2019-08-27 DIAGNOSIS — I130 Hypertensive heart and chronic kidney disease with heart failure and stage 1 through stage 4 chronic kidney disease, or unspecified chronic kidney disease: Secondary | ICD-10-CM | POA: Diagnosis not present

## 2019-08-27 DIAGNOSIS — J45909 Unspecified asthma, uncomplicated: Secondary | ICD-10-CM | POA: Diagnosis not present

## 2019-08-27 DIAGNOSIS — E1122 Type 2 diabetes mellitus with diabetic chronic kidney disease: Secondary | ICD-10-CM | POA: Diagnosis not present

## 2019-08-27 DIAGNOSIS — D631 Anemia in chronic kidney disease: Secondary | ICD-10-CM | POA: Diagnosis not present

## 2019-08-27 DIAGNOSIS — G4700 Insomnia, unspecified: Secondary | ICD-10-CM | POA: Diagnosis not present

## 2019-08-27 DIAGNOSIS — M810 Age-related osteoporosis without current pathological fracture: Secondary | ICD-10-CM | POA: Diagnosis not present

## 2019-09-15 DIAGNOSIS — S80911A Unspecified superficial injury of right knee, initial encounter: Secondary | ICD-10-CM | POA: Diagnosis not present

## 2019-10-30 DIAGNOSIS — E559 Vitamin D deficiency, unspecified: Secondary | ICD-10-CM | POA: Diagnosis not present

## 2019-10-30 DIAGNOSIS — D649 Anemia, unspecified: Secondary | ICD-10-CM | POA: Diagnosis not present

## 2019-10-30 DIAGNOSIS — Z23 Encounter for immunization: Secondary | ICD-10-CM | POA: Diagnosis not present

## 2019-10-30 DIAGNOSIS — Z9181 History of falling: Secondary | ICD-10-CM | POA: Diagnosis not present

## 2019-10-30 DIAGNOSIS — E063 Autoimmune thyroiditis: Secondary | ICD-10-CM | POA: Diagnosis not present

## 2019-10-30 DIAGNOSIS — N189 Chronic kidney disease, unspecified: Secondary | ICD-10-CM | POA: Diagnosis not present

## 2019-10-30 DIAGNOSIS — Z8673 Personal history of transient ischemic attack (TIA), and cerebral infarction without residual deficits: Secondary | ICD-10-CM | POA: Diagnosis not present

## 2019-10-30 DIAGNOSIS — Z1331 Encounter for screening for depression: Secondary | ICD-10-CM | POA: Diagnosis not present

## 2019-10-30 DIAGNOSIS — E118 Type 2 diabetes mellitus with unspecified complications: Secondary | ICD-10-CM | POA: Diagnosis not present

## 2019-10-30 DIAGNOSIS — I2510 Atherosclerotic heart disease of native coronary artery without angina pectoris: Secondary | ICD-10-CM | POA: Diagnosis not present

## 2019-10-30 DIAGNOSIS — I509 Heart failure, unspecified: Secondary | ICD-10-CM | POA: Diagnosis not present

## 2019-10-30 DIAGNOSIS — E785 Hyperlipidemia, unspecified: Secondary | ICD-10-CM | POA: Diagnosis not present

## 2019-10-30 DIAGNOSIS — Z79899 Other long term (current) drug therapy: Secondary | ICD-10-CM | POA: Diagnosis not present

## 2019-12-26 NOTE — Telephone Encounter (Signed)
Closing encounter

## 2020-02-01 DIAGNOSIS — N3281 Overactive bladder: Secondary | ICD-10-CM | POA: Diagnosis not present

## 2020-02-01 DIAGNOSIS — E063 Autoimmune thyroiditis: Secondary | ICD-10-CM | POA: Diagnosis not present

## 2020-02-01 DIAGNOSIS — E559 Vitamin D deficiency, unspecified: Secondary | ICD-10-CM | POA: Diagnosis not present

## 2020-02-01 DIAGNOSIS — J449 Chronic obstructive pulmonary disease, unspecified: Secondary | ICD-10-CM | POA: Diagnosis not present

## 2020-02-01 DIAGNOSIS — E118 Type 2 diabetes mellitus with unspecified complications: Secondary | ICD-10-CM | POA: Diagnosis not present

## 2020-02-01 DIAGNOSIS — I509 Heart failure, unspecified: Secondary | ICD-10-CM | POA: Diagnosis not present

## 2020-02-01 DIAGNOSIS — N189 Chronic kidney disease, unspecified: Secondary | ICD-10-CM | POA: Diagnosis not present

## 2020-02-01 DIAGNOSIS — D649 Anemia, unspecified: Secondary | ICD-10-CM | POA: Diagnosis not present

## 2020-02-01 DIAGNOSIS — Z139 Encounter for screening, unspecified: Secondary | ICD-10-CM | POA: Diagnosis not present

## 2020-02-01 DIAGNOSIS — K2100 Gastro-esophageal reflux disease with esophagitis, without bleeding: Secondary | ICD-10-CM | POA: Diagnosis not present

## 2020-02-01 DIAGNOSIS — E785 Hyperlipidemia, unspecified: Secondary | ICD-10-CM | POA: Diagnosis not present

## 2020-03-05 DIAGNOSIS — E860 Dehydration: Secondary | ICD-10-CM | POA: Diagnosis not present

## 2020-03-05 DIAGNOSIS — R079 Chest pain, unspecified: Secondary | ICD-10-CM | POA: Diagnosis not present

## 2020-03-05 DIAGNOSIS — I2510 Atherosclerotic heart disease of native coronary artery without angina pectoris: Secondary | ICD-10-CM | POA: Diagnosis not present

## 2020-03-05 DIAGNOSIS — I10 Essential (primary) hypertension: Secondary | ICD-10-CM | POA: Diagnosis not present

## 2020-03-05 DIAGNOSIS — E785 Hyperlipidemia, unspecified: Secondary | ICD-10-CM | POA: Diagnosis not present

## 2020-03-05 DIAGNOSIS — N179 Acute kidney failure, unspecified: Secondary | ICD-10-CM | POA: Diagnosis not present

## 2020-03-05 DIAGNOSIS — Z951 Presence of aortocoronary bypass graft: Secondary | ICD-10-CM | POA: Diagnosis not present

## 2020-03-05 DIAGNOSIS — R072 Precordial pain: Secondary | ICD-10-CM | POA: Diagnosis not present

## 2020-03-05 DIAGNOSIS — R42 Dizziness and giddiness: Secondary | ICD-10-CM | POA: Diagnosis not present

## 2020-03-05 DIAGNOSIS — R1011 Right upper quadrant pain: Secondary | ICD-10-CM | POA: Diagnosis not present

## 2020-03-05 DIAGNOSIS — J45909 Unspecified asthma, uncomplicated: Secondary | ICD-10-CM | POA: Diagnosis not present

## 2020-03-05 DIAGNOSIS — E039 Hypothyroidism, unspecified: Secondary | ICD-10-CM | POA: Diagnosis not present

## 2020-03-05 DIAGNOSIS — R0789 Other chest pain: Secondary | ICD-10-CM | POA: Diagnosis not present

## 2020-03-05 DIAGNOSIS — Z955 Presence of coronary angioplasty implant and graft: Secondary | ICD-10-CM | POA: Diagnosis not present

## 2020-03-05 DIAGNOSIS — R7401 Elevation of levels of liver transaminase levels: Secondary | ICD-10-CM | POA: Diagnosis not present

## 2020-03-06 DIAGNOSIS — R079 Chest pain, unspecified: Secondary | ICD-10-CM | POA: Diagnosis not present

## 2020-03-11 DIAGNOSIS — E063 Autoimmune thyroiditis: Secondary | ICD-10-CM | POA: Diagnosis not present

## 2020-03-11 DIAGNOSIS — E785 Hyperlipidemia, unspecified: Secondary | ICD-10-CM | POA: Diagnosis not present

## 2020-03-11 DIAGNOSIS — N3281 Overactive bladder: Secondary | ICD-10-CM | POA: Diagnosis not present

## 2020-03-11 DIAGNOSIS — E118 Type 2 diabetes mellitus with unspecified complications: Secondary | ICD-10-CM | POA: Diagnosis not present

## 2020-03-11 DIAGNOSIS — Z8673 Personal history of transient ischemic attack (TIA), and cerebral infarction without residual deficits: Secondary | ICD-10-CM | POA: Diagnosis not present

## 2020-03-11 DIAGNOSIS — D649 Anemia, unspecified: Secondary | ICD-10-CM | POA: Diagnosis not present

## 2020-03-11 DIAGNOSIS — Z79899 Other long term (current) drug therapy: Secondary | ICD-10-CM | POA: Diagnosis not present

## 2020-03-11 DIAGNOSIS — N189 Chronic kidney disease, unspecified: Secondary | ICD-10-CM | POA: Diagnosis not present

## 2020-03-11 DIAGNOSIS — I2510 Atherosclerotic heart disease of native coronary artery without angina pectoris: Secondary | ICD-10-CM | POA: Diagnosis not present

## 2020-03-11 DIAGNOSIS — N390 Urinary tract infection, site not specified: Secondary | ICD-10-CM | POA: Diagnosis not present

## 2020-03-18 ENCOUNTER — Other Ambulatory Visit: Payer: Self-pay

## 2020-03-18 ENCOUNTER — Ambulatory Visit: Payer: Medicare HMO | Admitting: Cardiology

## 2020-03-18 ENCOUNTER — Encounter: Payer: Self-pay | Admitting: Cardiology

## 2020-03-18 VITALS — BP 128/68 | HR 72 | Ht 61.0 in | Wt 148.0 lb

## 2020-03-18 DIAGNOSIS — I6930 Unspecified sequelae of cerebral infarction: Secondary | ICD-10-CM

## 2020-03-18 DIAGNOSIS — R002 Palpitations: Secondary | ICD-10-CM

## 2020-03-18 DIAGNOSIS — I10 Essential (primary) hypertension: Secondary | ICD-10-CM

## 2020-03-18 DIAGNOSIS — I2510 Atherosclerotic heart disease of native coronary artery without angina pectoris: Secondary | ICD-10-CM | POA: Diagnosis not present

## 2020-03-18 HISTORY — DX: Unspecified sequelae of cerebral infarction: I69.30

## 2020-03-18 NOTE — Addendum Note (Signed)
Addended by: Gaetano Net on: 03/18/2020 03:52 PM   Modules accepted: Orders

## 2020-03-18 NOTE — Progress Notes (Signed)
Cardiology Consultation:    Date:  03/18/2020   ID:  Tonya Richard, DOB November 14, 1946, MRN [id number]  PCP:  Penelope Coop, FNP  Cardiologist:  Jenne Campus, MD   Referring MD: No ref. provider found   Chief Complaint  Patient presents with  . New Patient (Initial Visit)  . Hospitalization Follow-up    History of Present Illness:    Tonya Richard is a 74 y.o. female who is being seen today for the evaluation of coronary artery disease at the request of No ref. provider found.  Past medical history significant for coronary artery disease, status post coronary artery bypass graft done in 2012, after that she had cardiac catheterization which showed LIMA to LAD being patent, SVG to diagonal branch which is patent, then SVG to right coronary artery and obtuse marginal branch was occluded.  Her left ventricle ejection fraction was normal.  She also got diabetes, hypertension, dyslipidemia, history of CVA last year which left her with some aphasia.  She recently ended up coming to the hospital in [place] because of atypical chest pain.  Quite extensive evaluation has been done.  Troponin eyes were normal after that she got stress test which showed no evidence of ischemia.  She would like to be reestablished in my office in consultation.  Denies having any chest pain tightness squeezing pressure burning chest.  She does have difficulty moving around because of history of stroke and balance issue.  Does have some shortness of breath.  Past Medical History:  Diagnosis Date  . Asthma   . CAD (coronary artery disease), native coronary artery    h/o CABG, stent  . DM type 2 (diabetes mellitus, type 2) ([place])   . GERD (gastroesophageal reflux disease)   . Hyperlipidemia   . Hypertension   . Hypothyroidism   . Myocardial infarction ([place])   . Shortness of breath     Past Surgical History:  Procedure Laterality Date  . CORONARY ARTERY BYPASS GRAFT    . VAGINAL HYSTERECTOMY    . WRIST  SURGERY      Current Medications: Current Meds  Medication Sig  . ALPRAZolam (XANAX) 0.5 MG tablet Take 0.5 mg by mouth at bedtime.  Marland Kitchen ampicillin (PRINCIPEN) 500 MG capsule Take 500 mg by mouth 4 (four) times daily.  Marland Kitchen aspirin EC 325 MG tablet Take by mouth daily.   . budesonide-formoterol (SYMBICORT) 160-4.5 MCG/ACT inhaler Inhale 2 puffs into the lungs 2 (two) times daily.  . clopidogrel (PLAVIX) 75 MG tablet Take 75 mg by mouth daily.  . Dulaglutide 1.5 MG/0.5ML SOPN INJECT 0.5 MILLILITER UNDER SKIN AS DIRECTED  . ezetimibe (ZETIA) 10 MG tablet Take 10 mg by mouth daily.  . Insulin Glargine (BASAGLAR KWIKPEN) 100 UNIT/ML Inject 35 Units into the skin daily.  Marland Kitchen ipratropium-albuterol (DUONEB) 0.5-2.5 (3) MG/3ML SOLN Take 3 mLs by nebulization 4 (four) times daily as needed (shortness of breath/wheezing).  . isosorbide mononitrate (IMDUR) 60 MG 24 hr tablet Take by mouth daily.   Marland Kitchen levothyroxine (SYNTHROID, LEVOTHROID) 50 MCG tablet Take 50 mcg by mouth daily before breakfast.  . metFORMIN (GLUCOPHAGE) 1000 MG tablet Take 1,000 mg by mouth 2 (two) times daily.  . metoprolol succinate (TOPROL-XL) 100 MG 24 hr tablet Take by mouth daily. Take with or immediately following a meal.   . nitroGLYCERIN (NITROSTAT) 0.4 MG SL tablet Place under the tongue.  Marland Kitchen NOVOLOG FLEXPEN 100 UNIT/ML FlexPen INJECT AS DIRECTED PER SLIDING SCALE AND BLOOD SUGAR READINGS.  MAX DAILY DOSE IS 50 UNITS  . oxybutynin (DITROPAN) 5 MG tablet   . pantoprazole (PROTONIX) 40 MG tablet Take 40 mg by mouth daily.  . rosuvastatin (CRESTOR) 40 MG tablet Take 40 mg by mouth daily.  Marland Kitchen tiZANidine (ZANAFLEX) 4 MG tablet Take 4 mg by mouth at bedtime as needed.  . traMADol (ULTRAM) 50 MG tablet Take by mouth every 6 (six) hours as needed.  . Vitamin D, Ergocalciferol, (DRISDOL) 1.25 MG (50000 UNIT) CAPS capsule Take 50,000 Units by mouth once a week.     Allergies:   Atorvastatin   Social History   Socioeconomic History  .  Marital status: Widowed    Spouse name: Not on file  . Number of children: Not on file  . Years of education: Not on file  . Highest education level: Not on file  Occupational History  . Not on file  Tobacco Use  . Smoking status: Former Smoker    Quit date: 11/29/1997    Years since quitting: 22.3  . Smokeless tobacco: Current User    Types: Snuff  Substance and Sexual Activity  . Alcohol use: No  . Drug use: No  . Sexual activity: Not on file  Other Topics Concern  . Not on file  Social History Narrative  . Not on file   Social Determinants of Health   Financial Resource Strain:   . Difficulty of Paying Living Expenses:   Food Insecurity:   . Worried About Charity fundraiser in the Last Year:   . Arboriculturist in the Last Year:   Transportation Needs:   . Film/video editor (Medical):   Marland Kitchen Lack of Transportation (Non-Medical):   Physical Activity:   . Days of Exercise per Week:   . Minutes of Exercise per Session:   Stress:   . Feeling of Stress :   Social Connections:   . Frequency of Communication with Friends and Family:   . Frequency of Social Gatherings with Friends and Family:   . Attends Religious Services:   . Active Member of Clubs or Organizations:   . Attends Archivist Meetings:   Marland Kitchen Marital Status:      Family History: The patient's family history includes Diabetes in her father; Heart disease in her brother and father. ROS:   Please see the history of present illness.    All 14 point review of systems negative except as described per history of present illness.  EKGs/Labs/Other Studies Reviewed:    The following studies were reviewed today: Stress test reviewed from hospital showing no evidence of ischemia    Recent Labs: No results found for requested labs within last 8760 hours.  Recent Lipid Panel No results found for: CHOL, TRIG, HDL, CHOLHDL, VLDL, LDLCALC, LDLDIRECT  Physical Exam:    VS:  BP 128/68   Pulse 72   Ht  5\' 1"  (1.549 m)   Wt 148 lb (67.1 kg)   SpO2 95%   BMI 27.96 kg/m     Wt Readings from Last 3 Encounters:  03/18/20 148 lb (67.1 kg)  08/19/14 175 lb 0.7 oz (79.4 kg)     GEN:  Well nourished, well developed in no acute distress HEENT: Normal NECK: No JVD; No carotid bruits LYMPHATICS: No lymphadenopathy CARDIAC: RRR, no murmurs, no rubs, no gallops RESPIRATORY:  Clear to auscultation without rales, wheezing or rhonchi  ABDOMEN: Soft, non-tender, non-distended MUSCULOSKELETAL:  No edema; No deformity  SKIN: Warm and dry  NEUROLOGIC:  Alert and oriented x 3 PSYCHIATRIC:  Normal affect   ASSESSMENT:    1. Coronary artery disease involving native coronary artery of native heart without angina pectoris   2. Essential hypertension, benign   3. Late effect of cerebrovascular accident (CVA)    PLAN:    In order of problems listed above:  1. Coronary artery disease status post coronary bypass grafting 2012 with 2 grafts occluded.  Asymptomatic doing well on dual antiplatelets therapy which I will continue for now.  Recent stress test done in around the hospital reviewed showing no evidence of ischemia. 2. Essential hypertension.  Blood pressure well controlled today 128/68.  We will continue present management. 3. Late effect of CVA.  I will ask her to have carotic ultrasounds, also she will wear Zio patch for 2 weeks we will looking for potential explanation for his CVA.  In the meantime we will continue dual antiplatelets therapy as well as statin therapy. 4. Dyslipidemia her LDL is 107 in spite of maximum therapy.  She is on Lipitor 80 as well as Zetia 10 she takes this medication religiously.  I will refer her to lipid clinic with consideration of PCSK9 agent.   Medication Adjustments/Labs and Tests Ordered: Current medicines are reviewed at length with the patient today.  Concerns regarding medicines are outlined above.  No orders of the defined types were placed in this  encounter.  No orders of the defined types were placed in this encounter.   Signed, Park Liter, MD, [facility]. 03/18/2020 10:05 AM    [place]

## 2020-03-18 NOTE — Patient Instructions (Addendum)
Medication Instructions:  Your physician recommends that you continue on your current medications as directed. Please refer to the Current Medication list given to you today.  *If you need a refill on your cardiac medications before your next appointment, please call your pharmacy*   Lab Work: None ordered  If you have labs (blood work) drawn today and your tests are completely normal, you will receive your results only by: Marland Kitchen MyChart Message (if you have MyChart) OR . A paper copy in the mail If you have any lab test that is abnormal or we need to change your treatment, we will call you to review the results.   Testing/Procedures: Your physician has requested that you have a carotid duplex. This test is an ultrasound of the carotid arteries in your neck. It looks at blood flow through these arteries that supply the brain with blood. Allow one hour for this exam. There are no restrictions or special instructions.    You have been referred to see Dr. Debara Pickett in the [facility].   ZIO XT- Long Term Monitor Instructions   Your physician has requested you wear your ZIO patch monitor 14 days.   This is a single patch monitor.  [employer] supplies one patch monitor per enrollment.  Additional stickers are not available.   Please do not apply patch if you will be having a Nuclear Stress Test, Echocardiogram, Cardiac CT, MRI, or Chest Xray during the time frame you would be wearing the monitor. The patch cannot be worn during these tests.  You cannot remove and re-apply the ZIO XT patch monitor.   Your ZIO patch monitor will be sent USPS Priority mail from [place] directly to your home address. The monitor may also be mailed to a PO BOX if home delivery is not available.   It may take 3-5 days to receive your monitor after you have been enrolled.   Once you have received you monitor, please review enclosed instructions.  Your monitor has already been registered assigning a specific  monitor serial # to you.   Applying the monitor   Shave hair from upper left chest.   Hold abrader disc by orange tab.  Rub abrader in 40 strokes over left upper chest as indicated in your monitor instructions.   Clean area with 4 enclosed alcohol pads .  Use all pads to assure are is cleaned thoroughly.  Let dry.   Apply patch as indicated in monitor instructions.  Patch will be place under collarbone on left side of chest with arrow pointing upward.   Rub patch adhesive wings for 2 minutes.Remove white label marked "1".  Remove white label marked "2".  Rub patch adhesive wings for 2 additional minutes.   While looking in a mirror, press and release button in center of patch.  A small green light will flash 3-4 times .  This will be your only indicator the monitor has been turned on.     Do not shower for the first 24 hours.  You may shower after the first 24 hours.   Press button if you feel a symptom. You will hear a small click.  Record Date, Time and Symptom in the Patient Log Book.   When you are ready to remove patch, follow instructions on last 2 pages of Patient Log Book.  Stick patch monitor onto last page of Patient Log Book.   Place Patient Log Book in [place] box.  Use locking tab on box and tape box  closed securely.  The [employer] has [employer] on it.  Please place in mailbox as soon as possible.  Your physician should have your test results approximately 7 days after the monitor has been mailed back to [facility].   Call [place] at [phone] if you have questions regarding your ZIO XT patch monitor.  Call them immediately if you see an orange light blinking on your monitor.   If your monitor falls off in less than 4 days contact our Monitor department at [phone].  If your monitor becomes loose or falls off after 4 days call [employer] at [phone] for suggestions on securing your monitor.    Follow-Up: At [facility],  you and your health needs are our priority.  As part of our continuing mission to provide you with exceptional heart care, we have created designated Provider Care Teams.  These Care Teams include your primary Cardiologist (physician) and Advanced Practice Providers (APPs -  Physician Assistants and Nurse Practitioners) who all work together to provide you with the care you need, when you need it.  We recommend signing up for the patient portal called "MyChart".  Sign up information is provided on this After Visit Summary.  MyChart is used to connect with patients for Virtual Visits (Telemedicine).  Patients are able to view lab/test results, encounter notes, upcoming appointments, etc.  Non-urgent messages can be sent to your provider as well.   To learn more about what you can do with MyChart, go to [email or web address].    Your next appointment:   2 month(s)  The format for your next appointment:   In Person  Provider:   Jenne Campus, MD   Other Instructions

## 2020-03-21 ENCOUNTER — Ambulatory Visit (INDEPENDENT_AMBULATORY_CARE_PROVIDER_SITE_OTHER): Payer: Medicare HMO

## 2020-03-21 DIAGNOSIS — R002 Palpitations: Secondary | ICD-10-CM | POA: Diagnosis not present

## 2020-03-21 DIAGNOSIS — I6930 Unspecified sequelae of cerebral infarction: Secondary | ICD-10-CM

## 2020-03-21 DIAGNOSIS — I2510 Atherosclerotic heart disease of native coronary artery without angina pectoris: Secondary | ICD-10-CM

## 2020-03-27 DIAGNOSIS — D649 Anemia, unspecified: Secondary | ICD-10-CM | POA: Diagnosis not present

## 2020-03-27 DIAGNOSIS — Z6827 Body mass index (BMI) 27.0-27.9, adult: Secondary | ICD-10-CM | POA: Diagnosis not present

## 2020-03-27 DIAGNOSIS — R300 Dysuria: Secondary | ICD-10-CM | POA: Diagnosis not present

## 2020-03-27 DIAGNOSIS — R748 Abnormal levels of other serum enzymes: Secondary | ICD-10-CM | POA: Diagnosis not present

## 2020-03-27 DIAGNOSIS — Z87440 Personal history of urinary (tract) infections: Secondary | ICD-10-CM | POA: Diagnosis not present

## 2020-03-27 DIAGNOSIS — R8290 Unspecified abnormal findings in urine: Secondary | ICD-10-CM | POA: Diagnosis not present

## 2020-03-27 DIAGNOSIS — E118 Type 2 diabetes mellitus with unspecified complications: Secondary | ICD-10-CM | POA: Diagnosis not present

## 2020-04-02 DIAGNOSIS — R945 Abnormal results of liver function studies: Secondary | ICD-10-CM | POA: Diagnosis not present

## 2020-04-08 ENCOUNTER — Other Ambulatory Visit: Payer: Self-pay

## 2020-04-08 ENCOUNTER — Ambulatory Visit (INDEPENDENT_AMBULATORY_CARE_PROVIDER_SITE_OTHER): Payer: Medicare HMO

## 2020-04-08 DIAGNOSIS — I10 Essential (primary) hypertension: Secondary | ICD-10-CM

## 2020-04-08 DIAGNOSIS — I6930 Unspecified sequelae of cerebral infarction: Secondary | ICD-10-CM | POA: Diagnosis not present

## 2020-04-08 DIAGNOSIS — I6523 Occlusion and stenosis of bilateral carotid arteries: Secondary | ICD-10-CM

## 2020-04-08 DIAGNOSIS — I2510 Atherosclerotic heart disease of native coronary artery without angina pectoris: Secondary | ICD-10-CM

## 2020-04-08 NOTE — Progress Notes (Signed)
Carotid duplex exam performed  Jimmy Riko Lumsden RDCS, RVT 

## 2020-04-14 ENCOUNTER — Telehealth: Payer: Self-pay | Admitting: Internal Medicine

## 2020-04-14 NOTE — Telephone Encounter (Signed)
Labs from [facility] 02/01/2020  TC: 184 Trig: 130 HDL: 52 LDL: 109

## 2020-04-15 ENCOUNTER — Telehealth: Payer: Self-pay | Admitting: Internal Medicine

## 2020-04-15 ENCOUNTER — Encounter: Payer: Self-pay | Admitting: Internal Medicine

## 2020-04-15 ENCOUNTER — Telehealth (INDEPENDENT_AMBULATORY_CARE_PROVIDER_SITE_OTHER): Payer: Medicare HMO | Admitting: Internal Medicine

## 2020-04-15 VITALS — BP 179/85 | Wt 148.0 lb

## 2020-04-15 DIAGNOSIS — Z794 Long term (current) use of insulin: Secondary | ICD-10-CM | POA: Diagnosis not present

## 2020-04-15 DIAGNOSIS — E785 Hyperlipidemia, unspecified: Secondary | ICD-10-CM | POA: Diagnosis not present

## 2020-04-15 DIAGNOSIS — I10 Essential (primary) hypertension: Secondary | ICD-10-CM | POA: Diagnosis not present

## 2020-04-15 DIAGNOSIS — E118 Type 2 diabetes mellitus with unspecified complications: Secondary | ICD-10-CM

## 2020-04-15 MED ORDER — PRALUENT 150 MG/ML ~~LOC~~ SOAJ: 1.0000 | SUBCUTANEOUS | 11 refills | Status: DC

## 2020-04-15 NOTE — Progress Notes (Signed)
Virtual Visit via Telephone Note   This visit type was conducted due to national recommendations for restrictions regarding the COVID-19 Pandemic (e.g. social distancing) in an effort to limit this patient's exposure and mitigate transmission in our community.  Due to her co-morbid illnesses, this patient is at least at moderate risk for complications without adequate follow up.  This format is felt to be most appropriate for this patient at this time.  The patient did not have access to video technology/had technical difficulties with video requiring transitioning to audio format only (telephone).  All issues noted in this document were discussed and addressed.  No physical exam could be performed with this format.  Please refer to the patient's chart for her  consent to telehealth for [facility].   Evaluation Performed:  Telephone consult  Date:  04/15/2020   ID:  NARIYA NEUMEYER, DOB [birth year], MRN [id number]  Patient Location:  [place] [address]  Provider location:   [address], [place] [address] [place], [place] [address]  PCP:  Penelope Coop, FNP  Cardiologist:  No primary care provider on file. Electrophysiologist:  None   Chief Complaint:  Manage dyslipidemia  History of Present Illness:    ANDREAH GOHEEN is a 74 y.o. female who presents via audio/video conferencing for a telehealth visit today.  This is a pleasant 74 year old female with coronary artery disease and history of coronary bypass grafting in 2012 with catheterization subsequent to that that showed a patent LIMA to LAD and SVG to diagonal and occluded SVG to RCA and OM.  She also has a history of type 2 diabetes, hypertension and dyslipidemia and suffered a stroke last year with some resultant aphasia.  More recently she had lipids in March 2021 which showed total cholesterol 184, HDL of 52, LDL 109 and triglycerides 130.  She reports compliance with rosuvastatin 40 mg daily and ezetimibe 10 mg  daily.  With back calculation, her LDL is likely greater than 200 although her daughter who I spoke with as well today says that she eats a pretty healthy diet and watches her sugars fairly closely.  That being said, her hemoglobin A1c was 10.5 in March as well indicating poor glycemic control.  There may likely be a dietary factor in her high cholesterol.  Family does not endorse a strong family history of early onset heart disease.  She was referred for evaluation of possible PCSK9 inhibitor therapy.  The patient does not have symptoms concerning for COVID-19 infection (fever, chills, cough, or new SHORTNESS OF BREATH).    Prior CV studies:   The following studies were reviewed today:  Chart reviewed Lab work  PMHx:  Past Medical History:  Diagnosis Date  . Asthma   . CAD (coronary artery disease), native coronary artery    h/o CABG, stent  . DM type 2 (diabetes mellitus, type 2) ([place])   . GERD (gastroesophageal reflux disease)   . Hyperlipidemia   . Hypertension   . Hypothyroidism   . Myocardial infarction ([place])   . Shortness of breath     Past Surgical History:  Procedure Laterality Date  . CORONARY ARTERY BYPASS GRAFT    . VAGINAL HYSTERECTOMY    . WRIST SURGERY      FAMHx:  Family History  Problem Relation Age of Onset  . Diabetes Father   . Heart disease Father   . Heart disease Brother     SOCHx:   reports that she quit  smoking about 22 years ago. Her smokeless tobacco use includes snuff. She reports that she does not drink alcohol or use drugs.  ALLERGIES:  Allergies  Allergen Reactions  . Atorvastatin     Other reaction(s): Other (See Comments) Muscle weakness and pain in legs    MEDS:  Current Meds  Medication Sig  . ALPRAZolam (XANAX) 0.5 MG tablet Take 0.5 mg by mouth at bedtime.  Marland Kitchen ampicillin (PRINCIPEN) 500 MG capsule Take 500 mg by mouth 4 (four) times daily.  Marland Kitchen aspirin EC 325 MG tablet Take by mouth daily.   . budesonide-formoterol  (SYMBICORT) 160-4.5 MCG/ACT inhaler Inhale 2 puffs into the lungs 2 (two) times daily.  . clopidogrel (PLAVIX) 75 MG tablet Take 75 mg by mouth daily.  . Dulaglutide 1.5 MG/0.5ML SOPN INJECT 0.5 MILLILITER UNDER SKIN AS DIRECTED  . ezetimibe (ZETIA) 10 MG tablet Take 10 mg by mouth daily.  . Insulin Glargine (BASAGLAR KWIKPEN) 100 UNIT/ML Inject 35 Units into the skin daily.  Marland Kitchen ipratropium-albuterol (DUONEB) 0.5-2.5 (3) MG/3ML SOLN Take 3 mLs by nebulization 4 (four) times daily as needed (shortness of breath/wheezing).  . isosorbide mononitrate (IMDUR) 60 MG 24 hr tablet Take by mouth daily.   Marland Kitchen levothyroxine (SYNTHROID, LEVOTHROID) 50 MCG tablet Take 50 mcg by mouth daily before breakfast.  . metFORMIN (GLUCOPHAGE) 1000 MG tablet Take 1,000 mg by mouth 2 (two) times daily.  . metoprolol succinate (TOPROL-XL) 100 MG 24 hr tablet Take by mouth daily. Take with or immediately following a meal.   . nitroGLYCERIN (NITROSTAT) 0.4 MG SL tablet Place under the tongue.  Marland Kitchen NOVOLOG FLEXPEN 100 UNIT/ML FlexPen INJECT AS DIRECTED PER SLIDING SCALE AND BLOOD SUGAR READINGS. MAX DAILY DOSE IS 50 UNITS  . oxybutynin (DITROPAN) 5 MG tablet   . pantoprazole (PROTONIX) 40 MG tablet Take 40 mg by mouth daily.  Marland Kitchen tiZANidine (ZANAFLEX) 4 MG tablet Take 4 mg by mouth at bedtime as needed.  . traMADol (ULTRAM) 50 MG tablet Take by mouth every 6 (six) hours as needed.  . Vitamin D, Ergocalciferol, (DRISDOL) 1.25 MG (50000 UNIT) CAPS capsule Take 50,000 Units by mouth once a week.     ROS: Pertinent items noted in HPI and remainder of comprehensive ROS otherwise negative.  Labs/Other Tests and Data Reviewed:    Recent Labs: No results found for requested labs within last 8760 hours.   Recent Lipid Panel No results found for: CHOL, TRIG, HDL, CHOLHDL, LDLCALC, LDLDIRECT  Wt Readings from Last 3 Encounters:  04/15/20 148 lb (67.1 kg)  03/18/20 148 lb (67.1 kg)  08/19/14 175 lb 0.7 oz (79.4 kg)     Exam:      Vital Signs:  BP (!) 179/85   Wt 148 lb (67.1 kg)   BMI 27.96 kg/m    Exam not performed due telephone  ASSESSMENT & PLAN:    1. Mixed dyslipidemia, goal LDL less than 70 2. Coronary artery disease with prior CABG 3. History of stroke with resultant aphasia 4. Type 2 diabetes-uncontrolled, A1c 10.5 5. Uncontrolled hypertension  Mrs. Norcia has a dyslipidemia but remains above goal LDL less than 70 on high potency statin and ezetimibe.  She is a good candidate to add a PCSK9 inhibitor and I did recommend Repatha 140 mg every 2 weeks.  She is ready on Trulicity and Lantus insulin and is familiar with injections.  A1c is not well controlled and she will need to work with her PCP or cardiologist on this.  I  would consider adding Jardiance given her history of diabetes and coronary disease as there is excellent cardiovascular risk reduction data.  Finally, blood pressure was very high today.  Her daughter said this was due to her just taking her medications and moving around.  Typically she says it comes down into the 130s or when it was last assessed on April 29 at 144/80.  Her target blood pressure should be around 120/80 and currently she is only really on metoprolol to lower blood pressure.  She may need additional therapy.  Her creatinine was 1.37, therefore she likely has stage III CKD based on her age.  There may be hesitancy to use an ACE inhibitor or ARB, but one could certainly add amlodipine or hydralazine.  I will defer this to her primary cardiologist.  Plan repeat labs in about 3 months and follow-up afterwards.  We did direct her to the health well foundation which could supply a grant if she qualifies.  Thanks again for the kind referral.  COVID-19 Education: The signs and symptoms of COVID-19 were discussed with the patient and how to seek care for testing (follow up with PCP or arrange E-visit).  The importance of social distancing was discussed today.  Patient Risk:   After  full review of this patients clinical status, I feel that they are at least moderate risk at this time.  Time:   Today, I have spent 25 minutes with the patient with telehealth technology discussing dyslipidemia, hypertension, uncontrolled diabetes, cardiovascular risk factors, coronary artery disease, prior stroke.     Medication Adjustments/Labs and Tests Ordered: Current medicines are reviewed at length with the patient today.  Concerns regarding medicines are outlined above.   Tests Ordered: Orders Placed This Encounter  Procedures  . Lipid panel    Medication Changes: No orders of the defined types were placed in this encounter.   Disposition:  in 3 month(s)  Pixie Casino, MD, [facility], [place] Director of the Advanced Lipid Disorders &  Cardiovascular Risk Reduction Clinic Diplomate of the American Board of Clinical Lipidology Attending Cardiologist  Direct Dial: [phone]  Fax: [phone]  Website:  [email or web address]  Pixie Casino, MD  04/15/2020 9:24 AM

## 2020-04-15 NOTE — Patient Instructions (Addendum)
Medication Instructions:  Dr. Debara Pickett recommends Praluent (PCSK9). This is an injectable cholesterol medication self-administered once every 14 days. This medication will need prior approval with your insurance company, which we will work on. If the medication is not approved initially, we may need to do an appeal with your insurance. We will keep you updated on this process.   Administer medication in area of fatty tissue such as abdomen, outer thigh, back up of arm - and rotate site with each injection Store medication in refrigerator until ready to administer - allow to sit at room temp for 30 mins - 1 hour prior to injection Dispose of medication in a SHARPS container - your pharmacy should be able to direct you on this and proper disposal   If you need co-pay assistance, please look into the program at healthwellfoundation.org >> disease funds >> hypercholesterolemia. This is an online application or you can call to complete.   If you need a co-pay card for Repatha: [email or web address]-[email or web address] >> paying for Repatha If you need a co-pay card for Praluent: WedMap.it >> starting & paying for Praluent  *If you need a refill on your cardiac medications before your next appointment, please call your pharmacy*   Lab Work: FASTING lipid panel in 3-4 months to check cholesterol   If you have labs (blood work) drawn today and your tests are completely normal, you will receive your results only by: Marland Kitchen MyChart Message (if you have MyChart) OR . A paper copy in the mail If you have any lab test that is abnormal or we need to change your treatment, we will call you to review the results.   Testing/Procedures: NONE   Follow-Up: At [facility], you and your health needs are our priority.  As part of our continuing mission to provide you with exceptional heart care, we have created designated Provider Care Teams.  These Care Teams include your primary Cardiologist (physician) and Advanced Practice Providers  (APPs -  Physician Assistants and Nurse Practitioners) who all work together to provide you with the care you need, when you need it.  We recommend signing up for the patient portal called "MyChart".  Sign up information is provided on this After Visit Summary.  MyChart is used to connect with patients for Virtual Visits (Telemedicine).  Patients are able to view lab/test results, encounter notes, upcoming appointments, etc.  Non-urgent messages can be sent to your provider as well.   To learn more about what you can do with MyChart, go to [email or web address].    Your next appointment:   3-4 month(s) - lipid clinic  The format for your next appointment:   Either In Person or Virtual  Provider:   K. Mali Hilty, MD   Other Instructions

## 2020-04-15 NOTE — Telephone Encounter (Signed)
Daughter aware med has been approved. Advised how to apply for healthwell grant. Rx(s) sent to pharmacy electronically.

## 2020-04-15 NOTE — Telephone Encounter (Signed)
This approval authorizes your coverage from 11/30/2019 - 11/28/2020, unless we notify you otherwise, and as long as the following conditions apply: - you remain enrolled in our Medicare Part D prescription drug plan, - your physician or other prescriber continues to prescribe the medication for you, and - the medication continues to be safe for treating your condition.

## 2020-04-15 NOTE — Addendum Note (Signed)
Addended by: Fidel Levy on: 04/15/2020 12:59 PM   Modules accepted: Orders

## 2020-04-15 NOTE — Telephone Encounter (Signed)
PA for praluent 150mg /mL submitted via [facility] Patient's insurance plan [employer] prefers praluent over repatha Key: Tonya Richard - PA Case ID: [id number]

## 2020-04-21 DIAGNOSIS — E1165 Type 2 diabetes mellitus with hyperglycemia: Secondary | ICD-10-CM | POA: Diagnosis not present

## 2020-04-21 DIAGNOSIS — I6523 Occlusion and stenosis of bilateral carotid arteries: Secondary | ICD-10-CM | POA: Diagnosis not present

## 2020-04-21 DIAGNOSIS — I342 Nonrheumatic mitral (valve) stenosis: Secondary | ICD-10-CM | POA: Diagnosis not present

## 2020-04-21 DIAGNOSIS — E782 Mixed hyperlipidemia: Secondary | ICD-10-CM | POA: Diagnosis not present

## 2020-04-21 DIAGNOSIS — R1310 Dysphagia, unspecified: Secondary | ICD-10-CM | POA: Diagnosis not present

## 2020-04-21 DIAGNOSIS — E441 Mild protein-calorie malnutrition: Secondary | ICD-10-CM | POA: Diagnosis not present

## 2020-04-21 DIAGNOSIS — I6350 Cerebral infarction due to unspecified occlusion or stenosis of unspecified cerebral artery: Secondary | ICD-10-CM | POA: Diagnosis not present

## 2020-04-21 DIAGNOSIS — R4781 Slurred speech: Secondary | ICD-10-CM | POA: Diagnosis not present

## 2020-04-21 DIAGNOSIS — R739 Hyperglycemia, unspecified: Secondary | ICD-10-CM | POA: Diagnosis not present

## 2020-04-21 DIAGNOSIS — J449 Chronic obstructive pulmonary disease, unspecified: Secondary | ICD-10-CM | POA: Diagnosis not present

## 2020-04-21 DIAGNOSIS — R471 Dysarthria and anarthria: Secondary | ICD-10-CM | POA: Diagnosis not present

## 2020-04-21 DIAGNOSIS — E8809 Other disorders of plasma-protein metabolism, not elsewhere classified: Secondary | ICD-10-CM | POA: Diagnosis not present

## 2020-04-21 DIAGNOSIS — R4702 Dysphasia: Secondary | ICD-10-CM | POA: Diagnosis not present

## 2020-04-21 DIAGNOSIS — I2510 Atherosclerotic heart disease of native coronary artery without angina pectoris: Secondary | ICD-10-CM | POA: Diagnosis not present

## 2020-04-21 DIAGNOSIS — I10 Essential (primary) hypertension: Secondary | ICD-10-CM | POA: Diagnosis not present

## 2020-04-21 DIAGNOSIS — G8194 Hemiplegia, unspecified affecting left nondominant side: Secondary | ICD-10-CM | POA: Diagnosis not present

## 2020-04-21 DIAGNOSIS — I361 Nonrheumatic tricuspid (valve) insufficiency: Secondary | ICD-10-CM | POA: Diagnosis not present

## 2020-04-21 DIAGNOSIS — I639 Cerebral infarction, unspecified: Secondary | ICD-10-CM | POA: Diagnosis not present

## 2020-04-21 DIAGNOSIS — I779 Disorder of arteries and arterioles, unspecified: Secondary | ICD-10-CM | POA: Diagnosis not present

## 2020-04-21 DIAGNOSIS — E039 Hypothyroidism, unspecified: Secondary | ICD-10-CM | POA: Diagnosis not present

## 2020-04-22 DIAGNOSIS — R739 Hyperglycemia, unspecified: Secondary | ICD-10-CM | POA: Diagnosis not present

## 2020-04-22 DIAGNOSIS — E8809 Other disorders of plasma-protein metabolism, not elsewhere classified: Secondary | ICD-10-CM | POA: Diagnosis not present

## 2020-04-22 DIAGNOSIS — I6350 Cerebral infarction due to unspecified occlusion or stenosis of unspecified cerebral artery: Secondary | ICD-10-CM | POA: Diagnosis not present

## 2020-04-22 DIAGNOSIS — R002 Palpitations: Secondary | ICD-10-CM | POA: Diagnosis not present

## 2020-04-22 DIAGNOSIS — J449 Chronic obstructive pulmonary disease, unspecified: Secondary | ICD-10-CM | POA: Diagnosis not present

## 2020-04-22 DIAGNOSIS — I2510 Atherosclerotic heart disease of native coronary artery without angina pectoris: Secondary | ICD-10-CM | POA: Diagnosis not present

## 2020-04-24 ENCOUNTER — Telehealth: Payer: Self-pay | Admitting: Internal Medicine

## 2020-04-24 NOTE — Telephone Encounter (Signed)
Outpatient Medication Detail   Disp Refills Start End   Alirocumab (PRALUENT) 150 MG/ML SOAJ 2 pen 11 04/15/2020    Sig - Route: Inject 1 Dose into the skin every 14 (fourteen) days. - Subcutaneous   Sent to pharmacy as: Alirocumab ([place]) 150 MG/ML Solution Auto-injector   E-Prescribing Status: Receipt confirmed by pharmacy (04/15/2020 12:59 PM EDT)   Pharmacy  [id number] - [place], [place]

## 2020-04-24 NOTE — Telephone Encounter (Signed)
LM for daughter that medication was sent to pharmacy on 04/15/2020

## 2020-04-24 NOTE — Telephone Encounter (Signed)
Accessed pt's chart due to speaking with Sherri the patient's daughter while Epic was down. Was unable to reach [facility] when calling back in order to help with what she originally called in regards to.

## 2020-04-24 NOTE — Telephone Encounter (Signed)
New Message:    Daughter called and said pt was supposed to be starting on a new Cholesterol medicine. The medicine have not been called in. She would like to know what is the problem, is the insurance the hold up?

## 2020-05-08 DIAGNOSIS — J449 Chronic obstructive pulmonary disease, unspecified: Secondary | ICD-10-CM | POA: Diagnosis not present

## 2020-05-08 DIAGNOSIS — K2100 Gastro-esophageal reflux disease with esophagitis, without bleeding: Secondary | ICD-10-CM | POA: Diagnosis not present

## 2020-05-08 DIAGNOSIS — E118 Type 2 diabetes mellitus with unspecified complications: Secondary | ICD-10-CM | POA: Diagnosis not present

## 2020-05-08 DIAGNOSIS — E785 Hyperlipidemia, unspecified: Secondary | ICD-10-CM | POA: Diagnosis not present

## 2020-05-08 DIAGNOSIS — E063 Autoimmune thyroiditis: Secondary | ICD-10-CM | POA: Diagnosis not present

## 2020-05-08 DIAGNOSIS — Z79899 Other long term (current) drug therapy: Secondary | ICD-10-CM | POA: Diagnosis not present

## 2020-05-08 DIAGNOSIS — I2510 Atherosclerotic heart disease of native coronary artery without angina pectoris: Secondary | ICD-10-CM | POA: Diagnosis not present

## 2020-05-08 DIAGNOSIS — N3281 Overactive bladder: Secondary | ICD-10-CM | POA: Diagnosis not present

## 2020-05-08 DIAGNOSIS — Z713 Dietary counseling and surveillance: Secondary | ICD-10-CM | POA: Diagnosis not present

## 2020-05-08 DIAGNOSIS — N189 Chronic kidney disease, unspecified: Secondary | ICD-10-CM | POA: Diagnosis not present

## 2020-05-12 ENCOUNTER — Other Ambulatory Visit: Payer: Self-pay

## 2020-05-12 DIAGNOSIS — I739 Peripheral vascular disease, unspecified: Secondary | ICD-10-CM

## 2020-05-12 HISTORY — DX: Peripheral vascular disease, unspecified: I73.9

## 2020-05-16 ENCOUNTER — Ambulatory Visit: Payer: Medicare HMO | Admitting: Cardiology

## 2020-05-19 ENCOUNTER — Ambulatory Visit (INDEPENDENT_AMBULATORY_CARE_PROVIDER_SITE_OTHER): Payer: Medicare HMO | Admitting: Cardiology

## 2020-05-19 ENCOUNTER — Encounter: Payer: Self-pay | Admitting: Cardiology

## 2020-05-19 ENCOUNTER — Other Ambulatory Visit: Payer: Self-pay

## 2020-05-19 VITALS — BP 160/82 | HR 95 | Ht 61.0 in | Wt 147.2 lb

## 2020-05-19 DIAGNOSIS — I739 Peripheral vascular disease, unspecified: Secondary | ICD-10-CM | POA: Diagnosis not present

## 2020-05-19 DIAGNOSIS — I2510 Atherosclerotic heart disease of native coronary artery without angina pectoris: Secondary | ICD-10-CM

## 2020-05-19 DIAGNOSIS — Z951 Presence of aortocoronary bypass graft: Secondary | ICD-10-CM | POA: Diagnosis not present

## 2020-05-19 DIAGNOSIS — E782 Mixed hyperlipidemia: Secondary | ICD-10-CM

## 2020-05-19 MED ORDER — ISOSORBIDE MONONITRATE ER 120 MG PO TB24: 120.0000 mg | ORAL_TABLET | Freq: Every day | ORAL | 1 refills | Status: DC

## 2020-05-19 NOTE — Patient Instructions (Signed)
Medication Instructions:  Your physician has recommended you make the following change in your medication:  INCREASE: Imdur to 120 mg daily  *If you need a refill on your cardiac medications before your next appointment, please call your pharmacy*   Lab Work: None. If you have labs (blood work) drawn today and your tests are completely normal, you will receive your results only by: Marland Kitchen MyChart Message (if you have MyChart) OR . A paper copy in the mail If you have any lab test that is abnormal or we need to change your treatment, we will call you to review the results.   Testing/Procedures: None.    Follow-Up: At [facility], you and your health needs are our priority.  As part of our continuing mission to provide you with exceptional heart care, we have created designated Provider Care Teams.  These Care Teams include your primary Cardiologist (physician) and Advanced Practice Providers (APPs -  Physician Assistants and Nurse Practitioners) who all work together to provide you with the care you need, when you need it.  We recommend signing up for the patient portal called "MyChart".  Sign up information is provided on this After Visit Summary.  MyChart is used to connect with patients for Virtual Visits (Telemedicine).  Patients are able to view lab/test results, encounter notes, upcoming appointments, etc.  Non-urgent messages can be sent to your provider as well.   To learn more about what you can do with MyChart, go to [email or web address].    Your next appointment:   3 month(s)  The format for your next appointment:   In Person  Provider:   Jenne Campus, MD   Other Instructions

## 2020-05-19 NOTE — Progress Notes (Signed)
Cardiology Office Note:    Date:  05/19/2020   ID:  Tonya Richard, DOB Jul 07, 1946, MRN [id number]  PCP:  Penelope Coop, FNP  Cardiologist:  Jenne Campus, MD    Referring MD: Penelope Coop, FNP   Chief Complaint  Patient presents with  . Follow-up    2 MO FU   I am doing fine  History of Present Illness:    Tonya Richard is a 74 y.o. female with past medical history significant for coronary artery disease status post coronary artery bypass graft in 2012 now with 2 occluded grafts.  She recently had stress test done in the hospital showed no evidence of ischemia.  She also got past medical history significant for essential hypertension as well as recent CVA and dyslipidemia.  Comes today 2 months for follow-up overall she is doing well but she is very traumatized by the fact that her mother is actually in hospice care.  She blames this on her blood pressure being high.  Denies have any chest pain, tightness, pressure, burning in the chest.  Just shortness of breath.  Past Medical History:  Diagnosis Date  . Asthma   . CAD (coronary artery disease), native coronary artery    h/o CABG, stent  . DM type 2 (diabetes mellitus, type 2) ([place])   . GERD (gastroesophageal reflux disease)   . Hyperlipidemia   . Hypertension   . Hypothyroidism   . Myocardial infarction ([place])   . Shortness of breath     Past Surgical History:  Procedure Laterality Date  . CORONARY ARTERY BYPASS GRAFT    . VAGINAL HYSTERECTOMY    . WRIST SURGERY      Current Medications: Current Meds  Medication Sig  . Alirocumab (PRALUENT) 150 MG/ML SOAJ Inject 1 Dose into the skin every 14 (fourteen) days.  Marland Kitchen aspirin EC 325 MG tablet Take by mouth daily.   Marland Kitchen BRILINTA 90 MG TABS tablet Take 90 mg by mouth 2 (two) times daily.  . budesonide-formoterol (SYMBICORT) 160-4.5 MCG/ACT inhaler Inhale 2 puffs into the lungs 2 (two) times daily.  . Dulaglutide 1.5 MG/0.5ML SOPN INJECT 0.5 MILLILITER UNDER SKIN AS  DIRECTED  . ezetimibe (ZETIA) 10 MG tablet Take 10 mg by mouth daily.  . ferrous sulfate 325 (65 FE) MG tablet Take 325 mg by mouth daily with breakfast.  . Insulin Glargine (BASAGLAR KWIKPEN) 100 UNIT/ML Inject 35 Units into the skin daily.  Marland Kitchen ipratropium-albuterol (DUONEB) 0.5-2.5 (3) MG/3ML SOLN Take 3 mLs by nebulization 4 (four) times daily as needed (shortness of breath/wheezing).  . isosorbide mononitrate (IMDUR) 60 MG 24 hr tablet Take by mouth daily.   Marland Kitchen levothyroxine (SYNTHROID, LEVOTHROID) 50 MCG tablet Take 50 mcg by mouth daily before breakfast.  . metFORMIN (GLUCOPHAGE) 1000 MG tablet Take 1,000 mg by mouth 2 (two) times daily.  . metoprolol succinate (TOPROL-XL) 100 MG 24 hr tablet Take by mouth daily. Take with or immediately following a meal.   . nitroGLYCERIN (NITROSTAT) 0.4 MG SL tablet Place under the tongue.  Marland Kitchen NOVOLOG FLEXPEN 100 UNIT/ML FlexPen INJECT AS DIRECTED PER SLIDING SCALE AND BLOOD SUGAR READINGS. MAX DAILY DOSE IS 50 UNITS  . oxybutynin (DITROPAN) 5 MG tablet   . pantoprazole (PROTONIX) 40 MG tablet Take 40 mg by mouth daily.  Marland Kitchen tiZANidine (ZANAFLEX) 4 MG tablet Take 4 mg by mouth at bedtime as needed.  . traMADol (ULTRAM) 50 MG tablet Take by mouth every 6 (six) hours as needed.  Marland Kitchen  Vitamin D, Ergocalciferol, (DRISDOL) 1.25 MG (50000 UNIT) CAPS capsule Take 50,000 Units by mouth once a week.     Allergies:   Atorvastatin   Social History   Socioeconomic History  . Marital status: Widowed    Spouse name: Not on file  . Number of children: Not on file  . Years of education: Not on file  . Highest education level: Not on file  Occupational History  . Not on file  Tobacco Use  . Smoking status: Former Smoker    Quit date: 11/29/1997    Years since quitting: 22.4  . Smokeless tobacco: Current User    Types: Snuff  Substance and Sexual Activity  . Alcohol use: No  . Drug use: No  . Sexual activity: Not on file  Other Topics Concern  . Not on file    Social History Narrative  . Not on file   Social Determinants of Health   Financial Resource Strain:   . Difficulty of Paying Living Expenses:   Food Insecurity:   . Worried About Charity fundraiser in the Last Year:   . Arboriculturist in the Last Year:   Transportation Needs:   . Film/video editor (Medical):   Marland Kitchen Lack of Transportation (Non-Medical):   Physical Activity:   . Days of Exercise per Week:   . Minutes of Exercise per Session:   Stress:   . Feeling of Stress :   Social Connections:   . Frequency of Communication with Friends and Family:   . Frequency of Social Gatherings with Friends and Family:   . Attends Religious Services:   . Active Member of Clubs or Organizations:   . Attends Archivist Meetings:   Marland Kitchen Marital Status:      Family History: The patient's family history includes Diabetes in her father; Heart disease in her brother and father. ROS:   Please see the history of present illness.    All 14 point review of systems negative except as described per history of present illness  EKGs/Labs/Other Studies Reviewed:    EKG today showed normal sinus rhythm, right axis deviation, inferior myocardial infarction, nonspecific ST segment changes.  Recent Labs: No results found for requested labs within last 8760 hours.  Recent Lipid Panel No results found for: CHOL, TRIG, HDL, CHOLHDL, VLDL, LDLCALC, LDLDIRECT  Physical Exam:    VS:  BP (!) 160/82 (BP Location: Left Arm, Patient Position: Sitting, Cuff Size: Small)   Pulse 95   Ht 5\' 1"  (1.549 m)   Wt 147 lb 3.2 oz (66.8 kg)   SpO2 97%   BMI 27.81 kg/m     Wt Readings from Last 3 Encounters:  05/19/20 147 lb 3.2 oz (66.8 kg)  04/15/20 148 lb (67.1 kg)  03/18/20 148 lb (67.1 kg)     GEN:  Well nourished, well developed in no acute distress HEENT: Normal NECK: No JVD; No carotid bruits LYMPHATICS: No lymphadenopathy CARDIAC: RRR, no murmurs, no rubs, no gallops RESPIRATORY:   Clear to auscultation without rales, wheezing or rhonchi  ABDOMEN: Soft, non-tender, non-distended MUSCULOSKELETAL:  No edema; No deformity  SKIN: Warm and dry LOWER EXTREMITIES: no swelling NEUROLOGIC:  Alert and oriented x 3 PSYCHIATRIC:  Normal affect   ASSESSMENT:    1. Coronary artery disease involving native coronary artery of native heart without angina pectoris   2. Peripheral vascular disease ([place])   3. Mixed hyperlipidemia   4. Hx of CABG  PLAN:    In order of problems listed above:  1. Coronary disease status post coronary artery bypass grafting 2012.  Stress test done in the hospital showed no evidence of ischemia.  We will continue present management which include dual antiplatelets therapy. 2. Peripheral vascular disease stable carotic ultrasound reviewed with the patient showed no significant stenosis. 3. Mixed dyslipidemia: She was referred to our lipid clinic and she initiated PCSK9 agent.  She is scheduled to have fasting lipid profile within the next 2 weeks. 4. History of CVA.  Carotid ultrasound negative.  No significant arrhythmia identified on the monitor which we did review.  I told her that she most likely required implantable loop recorder.  She is not in the shape today to make a decision she is shaking a lot by the fact that her mother is sick.  Therefore we will continue this discussion when I see her next time. 5. I did review her K PN I do have last LDL of 109 from February 01, 2020.  HDL was 52.  Her hemoglobin A1c was 10.5 which is absolutely unacceptably high.  That need to be controlled better.  She will be referred back to primary care physician   Medication Adjustments/Labs and Tests Ordered: Current medicines are reviewed at length with the patient today.  Concerns regarding medicines are outlined above.  No orders of the defined types were placed in this encounter.  Medication changes: No orders of the defined types were placed in this  encounter.   Signed, Park Liter, MD, [facility] 05/19/2020 1:27 PM    [place]

## 2020-06-10 ENCOUNTER — Encounter: Payer: Self-pay | Admitting: Nurse Practitioner

## 2020-06-10 DIAGNOSIS — N3281 Overactive bladder: Secondary | ICD-10-CM | POA: Diagnosis not present

## 2020-06-10 DIAGNOSIS — Z8673 Personal history of transient ischemic attack (TIA), and cerebral infarction without residual deficits: Secondary | ICD-10-CM | POA: Diagnosis not present

## 2020-06-10 DIAGNOSIS — E063 Autoimmune thyroiditis: Secondary | ICD-10-CM | POA: Diagnosis not present

## 2020-06-10 DIAGNOSIS — Z6827 Body mass index (BMI) 27.0-27.9, adult: Secondary | ICD-10-CM | POA: Diagnosis not present

## 2020-06-10 DIAGNOSIS — N189 Chronic kidney disease, unspecified: Secondary | ICD-10-CM | POA: Diagnosis not present

## 2020-06-10 DIAGNOSIS — J449 Chronic obstructive pulmonary disease, unspecified: Secondary | ICD-10-CM | POA: Diagnosis not present

## 2020-06-10 DIAGNOSIS — D649 Anemia, unspecified: Secondary | ICD-10-CM | POA: Diagnosis not present

## 2020-06-10 DIAGNOSIS — E559 Vitamin D deficiency, unspecified: Secondary | ICD-10-CM | POA: Diagnosis not present

## 2020-06-10 DIAGNOSIS — E785 Hyperlipidemia, unspecified: Secondary | ICD-10-CM | POA: Diagnosis not present

## 2020-06-10 DIAGNOSIS — K2100 Gastro-esophageal reflux disease with esophagitis, without bleeding: Secondary | ICD-10-CM | POA: Diagnosis not present

## 2020-06-10 DIAGNOSIS — E118 Type 2 diabetes mellitus with unspecified complications: Secondary | ICD-10-CM | POA: Diagnosis not present

## 2020-06-24 DIAGNOSIS — R935 Abnormal findings on diagnostic imaging of other abdominal regions, including retroperitoneum: Secondary | ICD-10-CM | POA: Diagnosis not present

## 2020-07-17 ENCOUNTER — Telehealth: Payer: Self-pay | Admitting: Internal Medicine

## 2020-07-17 MED ORDER — BRILINTA 90 MG PO TABS: 90.0000 mg | ORAL_TABLET | Freq: Two times a day (BID) | ORAL | 3 refills | Status: DC

## 2020-07-17 NOTE — Telephone Encounter (Signed)
Patient's daughter is requesting to have a lipid lab order faxed to Dr. Harriette Ohara Perry's office.

## 2020-07-17 NOTE — Telephone Encounter (Signed)
Lipid panel due for Dr. Skip Estimable to PCP per daughter request.   Daugher aware.   She also request new rx for Brilinta.  Sent to pharmacy-Dr. Agustin Cree primary cards.

## 2020-07-18 ENCOUNTER — Telehealth: Payer: Self-pay | Admitting: Internal Medicine

## 2020-07-18 DIAGNOSIS — E785 Hyperlipidemia, unspecified: Secondary | ICD-10-CM | POA: Diagnosis not present

## 2020-07-18 NOTE — Telephone Encounter (Signed)
Spoke with Tonya Richard, the patient recently came in and they repeated the lab work. She is going to go ahead and fax the lab work from July. Fax number given.

## 2020-07-18 NOTE — Telephone Encounter (Signed)
Chasity from [facility] in [place] is calling regarding the order that was faxed to them for a lipid panel that was ordered back in 5/21 to be drawn. She states that the patient had a lipid panel done in July that she would be glad to fax if that could be used for the patients upcoming appointment with Dr. Debara Pickett or should they redraw. Please call.

## 2020-07-21 ENCOUNTER — Telehealth (INDEPENDENT_AMBULATORY_CARE_PROVIDER_SITE_OTHER): Payer: Medicare HMO | Admitting: Internal Medicine

## 2020-07-21 ENCOUNTER — Encounter: Payer: Self-pay | Admitting: Internal Medicine

## 2020-07-21 VITALS — BP 163/69 | HR 85 | Wt 148.0 lb

## 2020-07-21 DIAGNOSIS — I2510 Atherosclerotic heart disease of native coronary artery without angina pectoris: Secondary | ICD-10-CM | POA: Diagnosis not present

## 2020-07-21 DIAGNOSIS — I739 Peripheral vascular disease, unspecified: Secondary | ICD-10-CM | POA: Diagnosis not present

## 2020-07-21 DIAGNOSIS — E118 Type 2 diabetes mellitus with unspecified complications: Secondary | ICD-10-CM | POA: Diagnosis not present

## 2020-07-21 DIAGNOSIS — Z951 Presence of aortocoronary bypass graft: Secondary | ICD-10-CM | POA: Diagnosis not present

## 2020-07-21 DIAGNOSIS — Z794 Long term (current) use of insulin: Secondary | ICD-10-CM | POA: Diagnosis not present

## 2020-07-21 DIAGNOSIS — E785 Hyperlipidemia, unspecified: Secondary | ICD-10-CM | POA: Diagnosis not present

## 2020-07-21 DIAGNOSIS — I10 Essential (primary) hypertension: Secondary | ICD-10-CM | POA: Diagnosis not present

## 2020-07-21 MED ORDER — ROSUVASTATIN CALCIUM 20 MG PO TABS: 20.0000 mg | ORAL_TABLET | Freq: Every day | ORAL | 3 refills | Status: DC

## 2020-07-21 NOTE — Progress Notes (Signed)
Virtual Visit via Telephone Note   This visit type was conducted due to national recommendations for restrictions regarding the COVID-19 Pandemic (e.g. social distancing) in an effort to limit this patient's exposure and mitigate transmission in our community.  Due to her co-morbid illnesses, this patient is at least at moderate risk for complications without adequate follow up.  This format is felt to be most appropriate for this patient at this time.  The patient did not have access to video technology/had technical difficulties with video requiring transitioning to audio format only (telephone).  All issues noted in this document were discussed and addressed.  No physical exam could be performed with this format.  Please refer to the patient's chart for her  consent to telehealth for [facility].   Evaluation Performed:  Telephone follow-up  Date:  07/21/2020   ID:  Tonya Richard, DOB [birth year], MRN [id number]  Patient Location:  [place] [address]  Provider location:   [address], [place] [address] [place], [place] [address]  PCP:  Tonya Coop, FNP  Cardiologist:  Jenne Campus, MD Electrophysiologist:  None   Chief Complaint:  Follow-up dyslipidemia  History of Present Illness:    Tonya Richard is a 74 y.o. female who presents via audio/video conferencing for a telehealth visit today.  This is a pleasant 74 year old female with coronary artery disease and history of coronary bypass grafting in 2012 with catheterization subsequent to that that showed a patent LIMA to LAD and SVG to diagonal and occluded SVG to RCA and OM.  She also has a history of type 2 diabetes, hypertension and dyslipidemia and suffered a stroke last year with some resultant aphasia.  More recently she had lipids in March 2021 which showed total cholesterol 184, HDL of 52, LDL 109 and triglycerides 130.  She reports compliance with rosuvastatin 40 mg daily and ezetimibe 10 mg daily.   With back calculation, her LDL is likely greater than 200 although her daughter who I spoke with as well today says that she eats a pretty healthy diet and watches her sugars fairly closely.  That being said, her hemoglobin A1c was 10.5 in March as well indicating poor glycemic control.  There may likely be a dietary factor in her high cholesterol.  Family does not endorse a strong family history of early onset heart disease.  She was referred for evaluation of possible PCSK9 inhibitor therapy.  07/21/2020  [name] returns today for follow-up.  Overall she seems to be doing well on Repatha with regards to no side effects.  Previously she had been on rosuvastatin 40 and Zetia 10 mg daily.  For some reason she discontinued the rosuvastatin, either she was taken off the medicine or ran out of it and did not renew it.  I do not see any indication of the doctor K or myself recommended that (I would not have recommended it as the data for Repatha is built on underlying statin therapy).  Nevertheless, she has had a nice reduction in her lipids with total cholesterol 130, HDL 48, LDL 57 and triglycerides 147.  Hemoglobin A1c is I believe a little better at 9.6.  Blood pressure was again elevated today 163/69.  It was also elevated at 160/82 at her cardiologist visit in June.  The patient does not have symptoms concerning for COVID-19 infection (fever, chills, cough, or new SHORTNESS OF BREATH).    Prior CV studies:   The following studies were reviewed  today:  Chart reviewed Lab work  PMHx:  Past Medical History:  Diagnosis Date  . Asthma   . CAD (coronary artery disease), native coronary artery    h/o CABG, stent  . DM type 2 (diabetes mellitus, type 2) ([place])   . GERD (gastroesophageal reflux disease)   . Hyperlipidemia   . Hypertension   . Hypothyroidism   . Myocardial infarction ([place])   . Shortness of breath     Past Surgical History:  Procedure Laterality Date  . CORONARY ARTERY BYPASS  GRAFT    . VAGINAL HYSTERECTOMY    . WRIST SURGERY      FAMHx:  Family History  Problem Relation Age of Onset  . Diabetes Father   . Heart disease Father   . Heart disease Brother     SOCHx:   reports that she quit smoking about 22 years ago. Her smokeless tobacco use includes snuff. She reports that she does not drink alcohol and does not use drugs.  ALLERGIES:  Allergies  Allergen Reactions  . Atorvastatin     Other reaction(s): Other (See Comments) Muscle weakness and pain in legs    MEDS:  No outpatient medications have been marked as taking for the 07/21/20 encounter (Appointment) with Pixie Casino, MD.     ROS: Pertinent items noted in HPI and remainder of comprehensive ROS otherwise negative.  Labs/Other Tests and Data Reviewed:    Recent Labs: No results found for requested labs within last 8760 hours.   Recent Lipid Panel No results found for: CHOL, TRIG, HDL, CHOLHDL, LDLCALC, LDLDIRECT  Wt Readings from Last 3 Encounters:  05/19/20 147 lb 3.2 oz (66.8 kg)  04/15/20 148 lb (67.1 kg)  03/18/20 148 lb (67.1 kg)     Exam:    Vital Signs:  There were no vitals taken for this visit.   Exam not performed due telephone visit  ASSESSMENT & PLAN:    1. Mixed dyslipidemia, goal LDL less than 70 2. Coronary artery disease with prior CABG 3. History of stroke with resultant aphasia 4. Type 2 diabetes-A1c improved from 10.5-9.6 5. Uncontrolled hypertension  Tonya Richard has had a significant reduction in her lipids but for some reason discontinued her statin.  She denies any side effects with it.  Based on her lipid-lowering response to Repatha, which is primarily off of rosuvastatin, I would therefore recommend that we restart her rosuvastatin, but would reduce the dose to 20 mg daily.  We will also discontinue her ezetimibe.  Continue Repatha 140 mg every 2 weeks.  This combination of high potency rosuvastatin and Repatha should keep her a target LDL less  than 70.  Plan repeat labs in about 3 months and follow-up afterwards.  Will again defer to her primary cardiologist regarding uncontrolled hypertension or PCP.  Thanks for allowing me to participate in her care.  COVID-19 Education: The signs and symptoms of COVID-19 were discussed with the patient and how to seek care for testing (follow up with PCP or arrange E-visit).  The importance of social distancing was discussed today.  Patient Risk:   After full review of this patients clinical status, I feel that they are at least moderate risk at this time.  Time:   Today, I have spent 25 minutes with the patient with telehealth technology discussing dyslipidemia, hypertension, uncontrolled diabetes, cardiovascular risk factors, coronary artery disease, prior stroke.     Medication Adjustments/Labs and Tests Ordered: Current medicines are reviewed at length with the patient  today.  Concerns regarding medicines are outlined above.   Tests Ordered: No orders of the defined types were placed in this encounter.   Medication Changes: No orders of the defined types were placed in this encounter.   Disposition:  in 3 month(s)  Pixie Casino, MD, [facility], [place] Director of the Advanced Lipid Disorders &  Cardiovascular Risk Reduction Clinic Diplomate of the American Board of Clinical Lipidology Attending Cardiologist  Direct Dial: [phone]  Fax: [phone]  Website:  [email or web address]  Pixie Casino, MD  07/21/2020 9:11 AM

## 2020-07-21 NOTE — Patient Instructions (Signed)
Medication Instructions:  STOP zetia RESTART crestor 20mg  daily CONTINUE praluent and all other current medications  *If you need a refill on your cardiac medications before your next appointment, please call your pharmacy*   Lab Work: FASTING lipid panel in 3 months to check cholesterol   If you have labs (blood work) drawn today and your tests are completely normal, you will receive your results only by: Marland Kitchen MyChart Message (if you have MyChart) OR . A paper copy in the mail If you have any lab test that is abnormal or we need to change your treatment, we will call you to review the results.   Testing/Procedures: NONE   Follow-Up: At [facility], you and your health needs are our priority.  As part of our continuing mission to provide you with exceptional heart care, we have created designated Provider Care Teams.  These Care Teams include your primary Cardiologist (physician) and Advanced Practice Providers (APPs -  Physician Assistants and Nurse Practitioners) who all work together to provide you with the care you need, when you need it.  We recommend signing up for the patient portal called "MyChart".  Sign up information is provided on this After Visit Summary.  MyChart is used to connect with patients for Virtual Visits (Telemedicine).  Patients are able to view lab/test results, encounter notes, upcoming appointments, etc.  Non-urgent messages can be sent to your provider as well.   To learn more about what you can do with MyChart, go to [email or web address].    Your next appointment:   3 month(s) - lipid clinic  The format for your next appointment:   Virtual or In Person  Provider:   K. Mali Hilty, MD   Other Instructions

## 2020-07-31 DIAGNOSIS — R945 Abnormal results of liver function studies: Secondary | ICD-10-CM | POA: Diagnosis not present

## 2020-07-31 DIAGNOSIS — D126 Benign neoplasm of colon, unspecified: Secondary | ICD-10-CM | POA: Diagnosis not present

## 2020-08-12 DIAGNOSIS — N179 Acute kidney failure, unspecified: Secondary | ICD-10-CM | POA: Diagnosis not present

## 2020-08-12 DIAGNOSIS — E860 Dehydration: Secondary | ICD-10-CM | POA: Diagnosis not present

## 2020-08-12 DIAGNOSIS — E1065 Type 1 diabetes mellitus with hyperglycemia: Secondary | ICD-10-CM | POA: Diagnosis not present

## 2020-08-12 DIAGNOSIS — N189 Chronic kidney disease, unspecified: Secondary | ICD-10-CM | POA: Diagnosis not present

## 2020-08-12 DIAGNOSIS — W19XXXA Unspecified fall, initial encounter: Secondary | ICD-10-CM | POA: Diagnosis not present

## 2020-08-12 DIAGNOSIS — E1022 Type 1 diabetes mellitus with diabetic chronic kidney disease: Secondary | ICD-10-CM | POA: Diagnosis not present

## 2020-08-12 DIAGNOSIS — I129 Hypertensive chronic kidney disease with stage 1 through stage 4 chronic kidney disease, or unspecified chronic kidney disease: Secondary | ICD-10-CM | POA: Diagnosis not present

## 2020-08-12 DIAGNOSIS — R32 Unspecified urinary incontinence: Secondary | ICD-10-CM | POA: Diagnosis not present

## 2020-08-12 DIAGNOSIS — R531 Weakness: Secondary | ICD-10-CM | POA: Diagnosis not present

## 2020-08-12 DIAGNOSIS — E1165 Type 2 diabetes mellitus with hyperglycemia: Secondary | ICD-10-CM | POA: Diagnosis not present

## 2020-08-12 DIAGNOSIS — R410 Disorientation, unspecified: Secondary | ICD-10-CM | POA: Diagnosis not present

## 2020-08-18 ENCOUNTER — Other Ambulatory Visit: Payer: Self-pay | Admitting: Cardiology

## 2020-08-25 DIAGNOSIS — R5383 Other fatigue: Secondary | ICD-10-CM | POA: Diagnosis not present

## 2020-08-25 DIAGNOSIS — Z87440 Personal history of urinary (tract) infections: Secondary | ICD-10-CM | POA: Diagnosis not present

## 2020-08-25 DIAGNOSIS — Z23 Encounter for immunization: Secondary | ICD-10-CM | POA: Diagnosis not present

## 2020-08-25 DIAGNOSIS — R8290 Unspecified abnormal findings in urine: Secondary | ICD-10-CM | POA: Diagnosis not present

## 2020-08-25 DIAGNOSIS — B872 Ocular myiasis: Secondary | ICD-10-CM | POA: Diagnosis not present

## 2020-08-25 DIAGNOSIS — Z79899 Other long term (current) drug therapy: Secondary | ICD-10-CM | POA: Diagnosis not present

## 2020-08-25 DIAGNOSIS — I2510 Atherosclerotic heart disease of native coronary artery without angina pectoris: Secondary | ICD-10-CM | POA: Diagnosis not present

## 2020-08-25 DIAGNOSIS — Z8673 Personal history of transient ischemic attack (TIA), and cerebral infarction without residual deficits: Secondary | ICD-10-CM | POA: Diagnosis not present

## 2020-08-25 DIAGNOSIS — E063 Autoimmune thyroiditis: Secondary | ICD-10-CM | POA: Diagnosis not present

## 2020-08-25 DIAGNOSIS — Z7689 Persons encountering health services in other specified circumstances: Secondary | ICD-10-CM | POA: Diagnosis not present

## 2020-08-25 DIAGNOSIS — N189 Chronic kidney disease, unspecified: Secondary | ICD-10-CM | POA: Diagnosis not present

## 2020-08-25 DIAGNOSIS — Z6827 Body mass index (BMI) 27.0-27.9, adult: Secondary | ICD-10-CM | POA: Diagnosis not present

## 2020-08-25 DIAGNOSIS — R5381 Other malaise: Secondary | ICD-10-CM | POA: Diagnosis not present

## 2020-08-25 DIAGNOSIS — B372 Candidiasis of skin and nail: Secondary | ICD-10-CM | POA: Diagnosis not present

## 2020-08-25 DIAGNOSIS — D649 Anemia, unspecified: Secondary | ICD-10-CM | POA: Diagnosis not present

## 2020-08-25 DIAGNOSIS — N390 Urinary tract infection, site not specified: Secondary | ICD-10-CM | POA: Diagnosis not present

## 2020-08-29 ENCOUNTER — Ambulatory Visit: Payer: Medicare HMO | Admitting: Cardiology

## 2020-09-30 ENCOUNTER — Other Ambulatory Visit: Payer: Self-pay

## 2020-10-01 ENCOUNTER — Other Ambulatory Visit: Payer: Self-pay

## 2020-10-01 ENCOUNTER — Encounter: Payer: Self-pay | Admitting: Cardiology

## 2020-10-01 ENCOUNTER — Ambulatory Visit: Payer: Medicare HMO | Admitting: Cardiology

## 2020-10-01 VITALS — BP 128/88 | HR 98 | Ht 61.0 in | Wt 138.4 lb

## 2020-10-01 DIAGNOSIS — I739 Peripheral vascular disease, unspecified: Secondary | ICD-10-CM | POA: Diagnosis not present

## 2020-10-01 DIAGNOSIS — I2510 Atherosclerotic heart disease of native coronary artery without angina pectoris: Secondary | ICD-10-CM

## 2020-10-01 DIAGNOSIS — I6930 Unspecified sequelae of cerebral infarction: Secondary | ICD-10-CM | POA: Diagnosis not present

## 2020-10-01 DIAGNOSIS — Z951 Presence of aortocoronary bypass graft: Secondary | ICD-10-CM | POA: Diagnosis not present

## 2020-10-01 NOTE — Progress Notes (Signed)
Cardiology Office Note:    Date:  10/01/2020   ID:  Tonya Richard, DOB [birth year], MRN [id number]  PCP:  Penelope Coop, FNP  Cardiologist:  Jenne Campus, MD    Referring MD: Penelope Coop, FNP   No chief complaint on file. I am doing fine  History of Present Illness:    Tonya Richard is a 74 y.o. female with past medical history significant for coronary disease, status post coronary artery bypass grafting 2012, now she does have 2 occluded grafts, she did have a stress test done couple months ago in the hospital showed no evidence of ischemia.  Other past medical history is include diabetes which is poorly controlled, dyslipidemia with now with PSK 9 agent is excellently controlled, she also history of CVA she has been on dual antiplatelet therapy since then.  She comes today 2 months for follow-up.  Overall she is doing fine but few weeks ago she ended going to the emergency room because of confusion she was found to have UTI treated appropriately and now they are getting better.  She lives alone however her daughter lives 2 min away from her her daughter check on her quite often.  We did talk about potentially getting life alert so in case you have a problem in the future she would quickly notified somebody and ask for help.  Apparently when she got UTI she was found on the floor and nobody knows how long she was down.  Denies have any cardiac complaints no chest pain tightness squeezing pressure burning chest no dizziness no passing out.  Past Medical History:  Diagnosis Date  . Abnormal stress test 08/09/2017   Formatting of this note might be different from the original. Added automatically from request for surgery [phone]  . Acute renal failure ([place]) 08/16/2014  . Asthma   . Asthma, chronic 08/16/2014  . Benign essential hypertension 08/09/2017  . CAD (coronary artery disease), native coronary artery    h/o CABG, stent  . Chest pain 08/09/2017  . Chronic coronary artery disease  04/16/2019   Formatting of this note might be different from the original. 3-Vessel  . COPD (chronic obstructive pulmonary disease) ([place]) 10/13/2018  . DM type 2 (diabetes mellitus, type 2) ([place])   . Enteritis due to Clostridium difficile 08/17/2014  . Essential hypertension, benign 08/16/2014  . GERD (gastroesophageal reflux disease)   . Hx of CABG 08/09/2017  . Hyperkalemia, mild 08/16/2014  . Hyperlipidemia   . Hypertension   . Hypoglycemia 08/16/2014  . Hypothyroidism   . Late effect of cerebrovascular accident (CVA) 03/18/2020  . Long-term use of aspirin therapy 04/16/2019  . Metabolic acidosis 06/17/9469  . Mixed hyperlipidemia 08/09/2017  . Morbid obesity ([place]) 08/16/2014  . Myocardial infarction ([place])   . PAD (peripheral artery disease) ([place]) 08/09/2017  . Peripheral vascular disease ([place]) 05/12/2020  . Pre-op evaluation 06/29/2016  . Shortness of breath   . Strep pharyngitis 08/16/2014  . Type 2 diabetes mellitus with circulatory disorder, with long-term current use of insulin ([place]) 09/06/2017  . Vomiting and diarrhea 08/16/2014    Past Surgical History:  Procedure Laterality Date  . CORONARY ARTERY BYPASS GRAFT    . VAGINAL HYSTERECTOMY    . WRIST SURGERY      Current Medications: Current Meds  Medication Sig  . Alirocumab (PRALUENT) 150 MG/ML SOAJ Inject 1 Dose into the skin every 14 (fourteen) days.  . ALPRAZolam (XANAX) 0.5 MG tablet Take 0.5 mg by mouth at  bedtime.   Marland Kitchen aspirin EC 325 MG tablet Take by mouth daily.   Marland Kitchen BRILINTA 90 MG TABS tablet Take 1 tablet (90 mg total) by mouth 2 (two) times daily.  . budesonide-formoterol (SYMBICORT) 160-4.5 MCG/ACT inhaler Inhale 2 puffs into the lungs 2 (two) times daily.  . cephALEXin (KEFLEX) 500 MG capsule Take 500 mg by mouth 2 (two) times daily.  . Dulaglutide 1.5 MG/0.5ML SOPN INJECT 0.5 MILLILITER UNDER SKIN AS DIRECTED  . ezetimibe (ZETIA) 10 MG tablet Take 10 mg by mouth daily.  . ferrous sulfate 325 (65 FE) MG tablet Take 325  mg by mouth daily with breakfast.  . Insulin Glargine (BASAGLAR KWIKPEN) 100 UNIT/ML Inject 35 Units into the skin daily.  Marland Kitchen ipratropium-albuterol (DUONEB) 0.5-2.5 (3) MG/3ML SOLN Take 3 mLs by nebulization 4 (four) times daily as needed (shortness of breath/wheezing).  . isosorbide mononitrate (IMDUR) 120 MG 24 hr tablet TAKE 1 TABLET(120 MG) BY MOUTH DAILY  . levothyroxine (SYNTHROID, LEVOTHROID) 50 MCG tablet Take 50 mcg by mouth daily before breakfast.  . metFORMIN (GLUCOPHAGE) 1000 MG tablet Take 1,000 mg by mouth 2 (two) times daily.  . metoprolol succinate (TOPROL-XL) 100 MG 24 hr tablet Take by mouth daily. Take with or immediately following a meal.   . nitrofurantoin, macrocrystal-monohydrate, (MACROBID) 100 MG capsule Take 100 mg by mouth 2 (two) times daily.  . nitroGLYCERIN (NITROSTAT) 0.4 MG SL tablet Place under the tongue.  Marland Kitchen NOVOLOG FLEXPEN 100 UNIT/ML FlexPen INJECT AS DIRECTED PER SLIDING SCALE AND BLOOD SUGAR READINGS. MAX DAILY DOSE IS 50 UNITS  . oxybutynin (DITROPAN) 5 MG tablet Take 5 mg by mouth 2 (two) times daily.   . pantoprazole (PROTONIX) 40 MG tablet Take 40 mg by mouth daily.  . rosuvastatin (CRESTOR) 40 MG tablet Take 40 mg by mouth daily.  Marland Kitchen tiZANidine (ZANAFLEX) 4 MG tablet Take 4 mg by mouth at bedtime as needed.  . traMADol (ULTRAM) 50 MG tablet Take by mouth every 6 (six) hours as needed.  . Vitamin D, Ergocalciferol, (DRISDOL) 1.25 MG (50000 UNIT) CAPS capsule Take 50,000 Units by mouth once a week.     Allergies:   Atorvastatin   Social History   Socioeconomic History  . Marital status: Widowed    Spouse name: Not on file  . Number of children: Not on file  . Years of education: Not on file  . Highest education level: Not on file  Occupational History  . Not on file  Tobacco Use  . Smoking status: Former Smoker    Quit date: 11/29/1997    Years since quitting: 22.8  . Smokeless tobacco: Current User    Types: Snuff  Substance and Sexual  Activity  . Alcohol use: No  . Drug use: No  . Sexual activity: Not on file  Other Topics Concern  . Not on file  Social History Narrative  . Not on file   Social Determinants of Health   Financial Resource Strain:   . Difficulty of Paying Living Expenses: Not on file  Food Insecurity:   . Worried About Charity fundraiser in the Last Year: Not on file  . Ran Out of Food in the Last Year: Not on file  Transportation Needs:   . Lack of Transportation (Medical): Not on file  . Lack of Transportation (Non-Medical): Not on file  Physical Activity:   . Days of Exercise per Week: Not on file  . Minutes of Exercise per Session: Not on file  Stress:   . Feeling of Stress : Not on file  Social Connections:   . Frequency of Communication with Friends and Family: Not on file  . Frequency of Social Gatherings with Friends and Family: Not on file  . Attends Religious Services: Not on file  . Active Member of Clubs or Organizations: Not on file  . Attends Archivist Meetings: Not on file  . Marital Status: Not on file     Family History: The patient's family history includes Diabetes in her father; Heart disease in her brother and father. ROS:   Please see the history of present illness.    All 14 point review of systems negative except as described per history of present illness  EKGs/Labs/Other Studies Reviewed:      Recent Labs: No results found for requested labs within last 8760 hours.  Recent Lipid Panel No results found for: CHOL, TRIG, HDL, CHOLHDL, VLDL, LDLCALC, LDLDIRECT  Physical Exam:    VS:  BP 128/88   Pulse 98   Ht 5\' 1"  (1.549 m)   Wt 138 lb 6.4 oz (62.8 kg)   SpO2 99%   BMI 26.15 kg/m     Wt Readings from Last 3 Encounters:  10/01/20 138 lb 6.4 oz (62.8 kg)  07/21/20 148 lb (67.1 kg)  05/19/20 147 lb 3.2 oz (66.8 kg)     GEN:  Well nourished, well developed in no acute distress HEENT: Normal NECK: No JVD; No carotid bruits LYMPHATICS:  No lymphadenopathy CARDIAC: RRR, no murmurs, no rubs, no gallops RESPIRATORY:  Clear to auscultation without rales, wheezing or rhonchi  ABDOMEN: Soft, non-tender, non-distended MUSCULOSKELETAL:  No edema; No deformity  SKIN: Warm and dry LOWER EXTREMITIES: no swelling NEUROLOGIC:  Alert and oriented x 3 PSYCHIATRIC:  Normal affect   ASSESSMENT:    1. Coronary artery disease involving native coronary artery of native heart without angina pectoris   2. Peripheral vascular disease ([place])   3. Late effect of cerebrovascular accident (CVA)   4. Hx of CABG    PLAN:    In order of problems listed above:  1. Coronary artery disease stable from that point review recent stress test negative done in the hospital which I reviewed.  Continue present conservative approach.  She is on dual antiplatelets therapy because of recent CVA. 2. Peripheral vascular disease no new reactivation of the problem continue present management. 3. Late effect of CVA no new problems. 4. Dyslipidemia followed by our lipid clinic I did review note from them she is on PCSK9 agent with her LDL now being 37 and HDL of 47.  We may consider dropping Zetia I will leave it up to discretion of the pathologist. 5. History of UTI.  She recovered from this completely. 6. We did have discussion before about potentially doing implantable loop recorder since we not sure exactly why she had CVA.  She is reluctant to do it.  We did review monitor and monitor did not show any evidence of atrial fibrillation. I did review record from the emergency room for this visit  Medication Adjustments/Labs and Tests Ordered: Current medicines are reviewed at length with the patient today.  Concerns regarding medicines are outlined above.  No orders of the defined types were placed in this encounter.  Medication changes: No orders of the defined types were placed in this encounter.   Signed, Park Liter, MD, [facility] 10/01/2020 10:19 AM      [place]

## 2020-10-01 NOTE — Patient Instructions (Signed)
Medication Instructions:  Your physician recommends that you continue on your current medications as directed. Please refer to the Current Medication list given to you today.  *If you need a refill on your cardiac medications before your next appointment, please call your pharmacy*   Lab Work: None. If you have labs (blood work) drawn today and your tests are completely normal, you will receive your results only by: . MyChart Message (if you have MyChart) OR . A paper copy in the mail If you have any lab test that is abnormal or we need to change your treatment, we will call you to review the results.   Testing/Procedures: none   Follow-Up: At [facility], you and your health needs are our priority.  As part of our continuing mission to provide you with exceptional heart care, we have created designated Provider Care Teams.  These Care Teams include your primary Cardiologist (physician) and Advanced Practice Providers (APPs -  Physician Assistants and Nurse Practitioners) who all work together to provide you with the care you need, when you need it.  We recommend signing up for the patient portal called "MyChart".  Sign up information is provided on this After Visit Summary.  MyChart is used to connect with patients for Virtual Visits (Telemedicine).  Patients are able to view lab/test results, encounter notes, upcoming appointments, etc.  Non-urgent messages can be sent to your provider as well.   To learn more about what you can do with MyChart, go to [email or web address].    Your next appointment:   4 month(s)  The format for your next appointment:   In Person  Provider:   Robert Krasowski, MD   Other Instructions    

## 2020-10-29 ENCOUNTER — Encounter: Payer: Self-pay | Admitting: Internal Medicine

## 2020-10-29 ENCOUNTER — Telehealth (INDEPENDENT_AMBULATORY_CARE_PROVIDER_SITE_OTHER): Payer: Medicare HMO | Admitting: Internal Medicine

## 2020-10-29 VITALS — BP 160/75 | HR 72 | Ht 61.0 in | Wt 145.0 lb

## 2020-10-29 DIAGNOSIS — I739 Peripheral vascular disease, unspecified: Secondary | ICD-10-CM | POA: Diagnosis not present

## 2020-10-29 DIAGNOSIS — T466X5A Adverse effect of antihyperlipidemic and antiarteriosclerotic drugs, initial encounter: Secondary | ICD-10-CM

## 2020-10-29 DIAGNOSIS — M791 Myalgia, unspecified site: Secondary | ICD-10-CM

## 2020-10-29 DIAGNOSIS — I2510 Atherosclerotic heart disease of native coronary artery without angina pectoris: Secondary | ICD-10-CM | POA: Diagnosis not present

## 2020-10-29 DIAGNOSIS — E785 Hyperlipidemia, unspecified: Secondary | ICD-10-CM

## 2020-10-29 DIAGNOSIS — Z951 Presence of aortocoronary bypass graft: Secondary | ICD-10-CM

## 2020-10-29 NOTE — Progress Notes (Signed)
Virtual Visit via Telephone Note   This visit type was conducted due to national recommendations for restrictions regarding the COVID-19 Pandemic (e.g. social distancing) in an effort to limit this patient's exposure and mitigate transmission in our community.  Due to her co-morbid illnesses, this patient is at least at moderate risk for complications without adequate follow up.  This format is felt to be most appropriate for this patient at this time.  The patient did not have access to video technology/had technical difficulties with video requiring transitioning to audio format only (telephone).  All issues noted in this document were discussed and addressed.  No physical exam could be performed with this format.  Please refer to the patient's chart for her  consent to telehealth for [facility].   Evaluation Performed:  Telephone follow-up  Date:  10/29/2020   ID:  Tonya Richard, DOB Jan 26, 1946, MRN [id number]  Patient Location:  [place] [address]  Provider location:   [address] [place] [address] [place], [place] [address]  PCP:  Penelope Coop, FNP  Cardiologist:  Jenne Campus, MD Electrophysiologist:  None   Chief Complaint:  Follow-up dyslipidemia  History of Present Illness:    Tonya Richard is a 74 y.o. female who presents via audio/video conferencing for a telehealth visit today.  This is a pleasant 74 year old female with coronary artery disease and history of coronary bypass grafting in 2012 with catheterization subsequent to that that showed a patent LIMA to LAD and SVG to diagonal and occluded SVG to RCA and OM.  She also has a history of type 2 diabetes, hypertension and dyslipidemia and suffered a stroke last year with some resultant aphasia.  More recently she had lipids in March 2021 which showed total cholesterol 184, HDL of 52, LDL 109 and triglycerides 130.  She reports compliance with rosuvastatin 40 mg daily and ezetimibe 10 mg daily.   With back calculation, her LDL is likely greater than 200 although her daughter who I spoke with as well today says that she eats a pretty healthy diet and watches her sugars fairly closely.  That being said, her hemoglobin A1c was 10.5 in March as well indicating poor glycemic control.  There may likely be a dietary factor in her high cholesterol.  Family does not endorse a strong family history of early onset heart disease.  She was referred for evaluation of possible PCSK9 inhibitor therapy.  07/21/2020  [name] returns today for follow-up.  Overall she seems to be doing well on Repatha with regards to no side effects.  Previously she had been on rosuvastatin 40 and Zetia 10 mg daily.  For some reason she discontinued the rosuvastatin, either she was taken off the medicine or ran out of it and did not renew it.  I do not see any indication of the doctor K or myself recommended that (I would not have recommended it as the data for Repatha is built on underlying statin therapy).  Nevertheless, she has had a nice reduction in her lipids with total cholesterol 130, HDL 48, LDL 57 and triglycerides 147.  Hemoglobin A1c is I believe a little better at 9.6.  Blood pressure was again elevated today 163/69.  It was also elevated at 160/82 at her cardiologist visit in June.  10/29/2020  [name] is seen today for follow-up.  She has had an excellent response to Praluent which she was switched to from [place].  Overall she seems to be  tolerating well.  She had recent repeat lipids performed demonstrating a total cholesterol of 105, triglycerides 115, HDL 47 and LDL of 37.  She had seen her primary cardiologist who had initially recommended reduction in her statin or possibly stopping her Zetia but deferred it to me.  We discussed this today my recommendations were to stop her Zetia because of the relatively lower cardiovascular risk benefit and the fact that we could eliminate an additional medication.  That being  said I would be hesitant to reduce the dose of her statin in addition to the Praluent in order to maintain LDL at target.  I would expect about 20 to 25% increase after stopping the ezetimibe.  The patient does not have symptoms concerning for COVID-19 infection (fever, chills, cough, or new SHORTNESS OF BREATH).    Prior CV studies:   The following studies were reviewed today:  Chart reviewed Lab work  PMHx:  Past Medical History:  Diagnosis Date  . Abnormal stress test 08/09/2017   Formatting of this note might be different from the original. Added automatically from request for surgery [id number]  . Acute renal failure ([place]) 08/16/2014  . Asthma   . Asthma, chronic 08/16/2014  . Benign essential hypertension 08/09/2017  . CAD (coronary artery disease), native coronary artery    h/o CABG, stent  . Chest pain 08/09/2017  . Chronic coronary artery disease 04/16/2019   Formatting of this note might be different from the original. 3-Vessel  . COPD (chronic obstructive pulmonary disease) ([place]) 10/13/2018  . DM type 2 (diabetes mellitus, type 2) ([place])   . Enteritis due to Clostridium difficile 08/17/2014  . Essential hypertension, benign 08/16/2014  . GERD (gastroesophageal reflux disease)   . Hx of CABG 08/09/2017  . Hyperkalemia, mild 08/16/2014  . Hyperlipidemia   . Hypertension   . Hypoglycemia 08/16/2014  . Hypothyroidism   . Late effect of cerebrovascular accident (CVA) 03/18/2020  . Long-term use of aspirin therapy 04/16/2019  . Metabolic acidosis 05/05/3015  . Mixed hyperlipidemia 08/09/2017  . Morbid obesity ([place]) 08/16/2014  . Myocardial infarction ([place])   . PAD (peripheral artery disease) ([place]) 08/09/2017  . Peripheral vascular disease ([place]) 05/12/2020  . Pre-op evaluation 06/29/2016  . Shortness of breath   . Strep pharyngitis 08/16/2014  . Type 2 diabetes mellitus with circulatory disorder, with long-term current use of insulin ([place]) 09/06/2017  . Vomiting and diarrhea 08/16/2014     Past Surgical History:  Procedure Laterality Date  . CORONARY ARTERY BYPASS GRAFT    . VAGINAL HYSTERECTOMY    . WRIST SURGERY      FAMHx:  Family History  Problem Relation Age of Onset  . Diabetes Father   . Heart disease Father   . Heart disease Brother     SOCHx:   reports that she quit smoking about 22 years ago. Her smokeless tobacco use includes snuff. She reports that she does not drink alcohol and does not use drugs.  ALLERGIES:  Allergies  Allergen Reactions  . Atorvastatin     Other reaction(s): Other (See Comments) Muscle weakness and pain in legs    MEDS:  Current Meds  Medication Sig  . Alirocumab (PRALUENT) 150 MG/ML SOAJ Inject 1 Dose into the skin every 14 (fourteen) days.  . ALPRAZolam (XANAX) 0.5 MG tablet Take 0.5 mg by mouth at bedtime.   . Ascorbic Acid (VITAMIN C PO) Take by mouth daily.  Marland Kitchen aspirin EC 325 MG tablet Take by mouth daily.   Marland Kitchen  BRILINTA 90 MG TABS tablet Take 1 tablet (90 mg total) by mouth 2 (two) times daily.  . budesonide-formoterol (SYMBICORT) 160-4.5 MCG/ACT inhaler Inhale 2 puffs into the lungs 2 (two) times daily.  . Cyanocobalamin (VITAMIN B 12 PO) Take by mouth daily.  . ferrous sulfate 325 (65 FE) MG tablet Take 325 mg by mouth daily with breakfast.  . Insulin Glargine (BASAGLAR KWIKPEN) 100 UNIT/ML Inject 45 Units into the skin daily.   Marland Kitchen ipratropium-albuterol (DUONEB) 0.5-2.5 (3) MG/3ML SOLN Take 3 mLs by nebulization 4 (four) times daily as needed (shortness of breath/wheezing).  . isosorbide mononitrate (IMDUR) 120 MG 24 hr tablet TAKE 1 TABLET(120 MG) BY MOUTH DAILY  . levothyroxine (SYNTHROID, LEVOTHROID) 50 MCG tablet Take 50 mcg by mouth daily before breakfast.  . Melatonin 10 MG TABS Take by mouth at bedtime.  . metFORMIN (GLUCOPHAGE) 1000 MG tablet Take 1,000 mg by mouth 2 (two) times daily.  . metoprolol succinate (TOPROL-XL) 100 MG 24 hr tablet Take by mouth daily. Take with or immediately following a meal.   .  nitroGLYCERIN (NITROSTAT) 0.4 MG SL tablet Place under the tongue.  Marland Kitchen NOVOLOG FLEXPEN 100 UNIT/ML FlexPen INJECT AS DIRECTED PER SLIDING SCALE AND BLOOD SUGAR READINGS. MAX DAILY DOSE IS 50 UNITS  . oxybutynin (DITROPAN) 5 MG tablet Take 5 mg by mouth 2 (two) times daily.   . pantoprazole (PROTONIX) 40 MG tablet Take 40 mg by mouth daily.  . rosuvastatin (CRESTOR) 40 MG tablet Take 40 mg by mouth daily.  Marland Kitchen tiZANidine (ZANAFLEX) 4 MG tablet Take 4 mg by mouth at bedtime as needed.  . traMADol (ULTRAM) 50 MG tablet Take by mouth every 6 (six) hours as needed.  . Vitamin D, Ergocalciferol, (DRISDOL) 1.25 MG (50000 UNIT) CAPS capsule Take 50,000 Units by mouth once a week.  . [DISCONTINUED] Dulaglutide 1.5 MG/0.5ML SOPN INJECT 0.5 MILLILITER UNDER SKIN AS DIRECTED  . [DISCONTINUED] ezetimibe (ZETIA) 10 MG tablet Take 10 mg by mouth daily.     ROS: Pertinent items noted in HPI and remainder of comprehensive ROS otherwise negative.  Labs/Other Tests and Data Reviewed:    Recent Labs: No results found for requested labs within last 8760 hours.   Recent Lipid Panel No results found for: CHOL, TRIG, HDL, CHOLHDL, LDLCALC, LDLDIRECT  Wt Readings from Last 3 Encounters:  10/29/20 145 lb (65.8 kg)  10/01/20 138 lb 6.4 oz (62.8 kg)  07/21/20 148 lb (67.1 kg)     Exam:    Vital Signs:  BP (!) 160/75   Pulse 72   Ht 5\' 1"  (1.549 m)   Wt 145 lb (65.8 kg)   BMI 27.40 kg/m    Exam not performed due telephone visit  ASSESSMENT & PLAN:    1. Mixed dyslipidemia, goal LDL less than 70 2. Coronary artery disease with prior CABG 3. History of stroke with resultant aphasia 4. Type 2 diabetes-A1c improved from 10.5-9.6 5. Uncontrolled hypertension  Tonya Richard seems to have done well on Praluent.  Her LDL is now 37.  She is also on high potency rosuvastatin 40 mg daily and ezetimibe.  I advised her to stay on the high-dose rosuvastatin and stop the ezetimibe.  She will continue with Praluent.   We will plan repeat lipids in about 6 months.  If they are stable then we could follow-up less frequently.  COVID-19 Education: The signs and symptoms of COVID-19 were discussed with the patient and how to seek care for testing (follow up  with PCP or arrange E-visit).  The importance of social distancing was discussed today.  Patient Risk:   After full review of this patients clinical status, I feel that they are at least moderate risk at this time.  Time:   Today, I have spent 25 minutes with the patient with telehealth technology discussing dyslipidemia, hypertension, uncontrolled diabetes, cardiovascular risk factors, coronary artery disease, prior stroke.     Medication Adjustments/Labs and Tests Ordered: Current medicines are reviewed at length with the patient today.  Concerns regarding medicines are outlined above.   Tests Ordered: Orders Placed This Encounter  Procedures  . Lipid panel    Medication Changes: No orders of the defined types were placed in this encounter.   Disposition:  in 6 month(s)  Pixie Casino, MD, [facility], [place] Director of the Advanced Lipid Disorders &  Cardiovascular Risk Reduction Clinic Diplomate of the American Board of Clinical Lipidology Attending Cardiologist  Direct Dial: [phone]  Fax: [phone]  Website:  [email or web address]  Pixie Casino, MD  10/29/2020 9:32 AM

## 2020-10-29 NOTE — Patient Instructions (Signed)
Medication Instructions:  STOP zetia CONTINUE crestor 40mg  + praluent along with all other current medications  *If you need a refill on your cardiac medications before your next appointment, please call your pharmacy*   Lab Work: FASTING lipid panel in 6 months to check cholesterol   If you have labs (blood work) drawn today and your tests are completely normal, you will receive your results only by: Marland Kitchen MyChart Message (if you have MyChart) OR . A paper copy in the mail If you have any lab test that is abnormal or we need to change your treatment, we will call you to review the results.   Follow-Up: At [facility], you and your health needs are our priority.  As part of our continuing mission to provide you with exceptional heart care, we have created designated Provider Care Teams.  These Care Teams include your primary Cardiologist (physician) and Advanced Practice Providers (APPs -  Physician Assistants and Nurse Practitioners) who all work together to provide you with the care you need, when you need it.  We recommend signing up for the patient portal called "MyChart".  Sign up information is provided on this After Visit Summary.  MyChart is used to connect with patients for Virtual Visits (Telemedicine).  Patients are able to view lab/test results, encounter notes, upcoming appointments, etc.  Non-urgent messages can be sent to your provider as well.   To learn more about what you can do with MyChart, go to [email or web address].    Your next appointment:   6 month(s) - lipid clinic  The format for your next appointment:   In Person or Virtual  Provider:   K. Mali Hilty, MD   Other Instructions

## 2020-11-04 ENCOUNTER — Telehealth: Payer: Self-pay | Admitting: Internal Medicine

## 2020-11-04 NOTE — Telephone Encounter (Signed)
Attempted to submit PA via CMM for Praluent Message received:  Additional Information Required The patient currently has access to the requested medication and a Prior Authorization is not needed for the patient/medication.

## 2020-12-10 DIAGNOSIS — E039 Hypothyroidism, unspecified: Secondary | ICD-10-CM | POA: Diagnosis not present

## 2020-12-10 DIAGNOSIS — I2510 Atherosclerotic heart disease of native coronary artery without angina pectoris: Secondary | ICD-10-CM | POA: Diagnosis not present

## 2020-12-10 DIAGNOSIS — Z743 Need for continuous supervision: Secondary | ICD-10-CM | POA: Diagnosis not present

## 2020-12-10 DIAGNOSIS — J449 Chronic obstructive pulmonary disease, unspecified: Secondary | ICD-10-CM | POA: Diagnosis not present

## 2020-12-10 DIAGNOSIS — D649 Anemia, unspecified: Secondary | ICD-10-CM | POA: Diagnosis not present

## 2020-12-10 DIAGNOSIS — M549 Dorsalgia, unspecified: Secondary | ICD-10-CM | POA: Diagnosis not present

## 2020-12-10 DIAGNOSIS — R1013 Epigastric pain: Secondary | ICD-10-CM | POA: Diagnosis not present

## 2020-12-10 DIAGNOSIS — R0789 Other chest pain: Secondary | ICD-10-CM | POA: Diagnosis not present

## 2020-12-10 DIAGNOSIS — E1165 Type 2 diabetes mellitus with hyperglycemia: Secondary | ICD-10-CM | POA: Diagnosis not present

## 2020-12-10 DIAGNOSIS — R079 Chest pain, unspecified: Secondary | ICD-10-CM | POA: Diagnosis not present

## 2020-12-10 DIAGNOSIS — E860 Dehydration: Secondary | ICD-10-CM | POA: Diagnosis not present

## 2020-12-10 DIAGNOSIS — D72829 Elevated white blood cell count, unspecified: Secondary | ICD-10-CM | POA: Diagnosis not present

## 2020-12-10 DIAGNOSIS — K219 Gastro-esophageal reflux disease without esophagitis: Secondary | ICD-10-CM | POA: Diagnosis not present

## 2020-12-10 DIAGNOSIS — N179 Acute kidney failure, unspecified: Secondary | ICD-10-CM | POA: Diagnosis not present

## 2020-12-11 DIAGNOSIS — K219 Gastro-esophageal reflux disease without esophagitis: Secondary | ICD-10-CM | POA: Diagnosis not present

## 2020-12-11 DIAGNOSIS — Z951 Presence of aortocoronary bypass graft: Secondary | ICD-10-CM | POA: Diagnosis not present

## 2020-12-11 DIAGNOSIS — I10 Essential (primary) hypertension: Secondary | ICD-10-CM | POA: Diagnosis not present

## 2020-12-11 DIAGNOSIS — R079 Chest pain, unspecified: Secondary | ICD-10-CM | POA: Diagnosis not present

## 2020-12-11 DIAGNOSIS — N179 Acute kidney failure, unspecified: Secondary | ICD-10-CM

## 2020-12-11 DIAGNOSIS — R1013 Epigastric pain: Secondary | ICD-10-CM | POA: Diagnosis not present

## 2020-12-11 DIAGNOSIS — I2510 Atherosclerotic heart disease of native coronary artery without angina pectoris: Secondary | ICD-10-CM | POA: Diagnosis not present

## 2020-12-11 HISTORY — DX: Acute kidney failure, unspecified: N17.9

## 2020-12-12 DIAGNOSIS — J449 Chronic obstructive pulmonary disease, unspecified: Secondary | ICD-10-CM | POA: Diagnosis not present

## 2020-12-12 DIAGNOSIS — N179 Acute kidney failure, unspecified: Secondary | ICD-10-CM | POA: Diagnosis not present

## 2020-12-12 DIAGNOSIS — K219 Gastro-esophageal reflux disease without esophagitis: Secondary | ICD-10-CM | POA: Diagnosis not present

## 2020-12-12 DIAGNOSIS — I10 Essential (primary) hypertension: Secondary | ICD-10-CM | POA: Diagnosis not present

## 2020-12-18 DIAGNOSIS — B9689 Other specified bacterial agents as the cause of diseases classified elsewhere: Secondary | ICD-10-CM | POA: Diagnosis not present

## 2020-12-18 DIAGNOSIS — E1165 Type 2 diabetes mellitus with hyperglycemia: Secondary | ICD-10-CM | POA: Diagnosis not present

## 2020-12-18 DIAGNOSIS — R55 Syncope and collapse: Secondary | ICD-10-CM | POA: Diagnosis not present

## 2020-12-18 DIAGNOSIS — Y998 Other external cause status: Secondary | ICD-10-CM | POA: Diagnosis not present

## 2020-12-18 DIAGNOSIS — T50901A Poisoning by unspecified drugs, medicaments and biological substances, accidental (unintentional), initial encounter: Secondary | ICD-10-CM | POA: Diagnosis not present

## 2020-12-18 DIAGNOSIS — M549 Dorsalgia, unspecified: Secondary | ICD-10-CM | POA: Diagnosis not present

## 2020-12-18 DIAGNOSIS — Z794 Long term (current) use of insulin: Secondary | ICD-10-CM | POA: Diagnosis not present

## 2020-12-18 DIAGNOSIS — R404 Transient alteration of awareness: Secondary | ICD-10-CM | POA: Diagnosis not present

## 2020-12-18 DIAGNOSIS — N390 Urinary tract infection, site not specified: Secondary | ICD-10-CM | POA: Diagnosis not present

## 2020-12-18 DIAGNOSIS — X58XXXA Exposure to other specified factors, initial encounter: Secondary | ICD-10-CM | POA: Diagnosis not present

## 2020-12-18 DIAGNOSIS — Z743 Need for continuous supervision: Secondary | ICD-10-CM | POA: Diagnosis not present

## 2020-12-18 DIAGNOSIS — T50904A Poisoning by unspecified drugs, medicaments and biological substances, undetermined, initial encounter: Secondary | ICD-10-CM | POA: Diagnosis not present

## 2020-12-18 DIAGNOSIS — G8929 Other chronic pain: Secondary | ICD-10-CM | POA: Diagnosis not present

## 2020-12-19 DIAGNOSIS — N390 Urinary tract infection, site not specified: Secondary | ICD-10-CM | POA: Diagnosis not present

## 2020-12-19 DIAGNOSIS — R739 Hyperglycemia, unspecified: Secondary | ICD-10-CM | POA: Diagnosis not present

## 2020-12-19 DIAGNOSIS — T6591XA Toxic effect of unspecified substance, accidental (unintentional), initial encounter: Secondary | ICD-10-CM | POA: Diagnosis not present

## 2021-01-12 DIAGNOSIS — E119 Type 2 diabetes mellitus without complications: Secondary | ICD-10-CM | POA: Diagnosis not present

## 2021-01-12 DIAGNOSIS — R4182 Altered mental status, unspecified: Secondary | ICD-10-CM | POA: Diagnosis not present

## 2021-01-12 DIAGNOSIS — N390 Urinary tract infection, site not specified: Secondary | ICD-10-CM | POA: Diagnosis not present

## 2021-01-12 DIAGNOSIS — R7401 Elevation of levels of liver transaminase levels: Secondary | ICD-10-CM | POA: Diagnosis not present

## 2021-01-12 DIAGNOSIS — I4891 Unspecified atrial fibrillation: Secondary | ICD-10-CM | POA: Diagnosis not present

## 2021-01-12 DIAGNOSIS — E1165 Type 2 diabetes mellitus with hyperglycemia: Secondary | ICD-10-CM | POA: Diagnosis not present

## 2021-01-12 DIAGNOSIS — R0902 Hypoxemia: Secondary | ICD-10-CM | POA: Diagnosis not present

## 2021-01-12 DIAGNOSIS — Z743 Need for continuous supervision: Secondary | ICD-10-CM | POA: Diagnosis not present

## 2021-01-12 DIAGNOSIS — E1110 Type 2 diabetes mellitus with ketoacidosis without coma: Secondary | ICD-10-CM | POA: Diagnosis not present

## 2021-01-12 DIAGNOSIS — N170 Acute kidney failure with tubular necrosis: Secondary | ICD-10-CM | POA: Diagnosis not present

## 2021-01-12 DIAGNOSIS — A419 Sepsis, unspecified organism: Secondary | ICD-10-CM | POA: Diagnosis not present

## 2021-01-12 DIAGNOSIS — D649 Anemia, unspecified: Secondary | ICD-10-CM | POA: Diagnosis not present

## 2021-01-12 DIAGNOSIS — I2510 Atherosclerotic heart disease of native coronary artery without angina pectoris: Secondary | ICD-10-CM | POA: Diagnosis not present

## 2021-01-12 DIAGNOSIS — R69 Illness, unspecified: Secondary | ICD-10-CM | POA: Diagnosis not present

## 2021-01-12 DIAGNOSIS — E871 Hypo-osmolality and hyponatremia: Secondary | ICD-10-CM | POA: Diagnosis not present

## 2021-01-12 DIAGNOSIS — I10 Essential (primary) hypertension: Secondary | ICD-10-CM | POA: Diagnosis not present

## 2021-01-12 DIAGNOSIS — R739 Hyperglycemia, unspecified: Secondary | ICD-10-CM | POA: Diagnosis not present

## 2021-01-12 DIAGNOSIS — G9341 Metabolic encephalopathy: Secondary | ICD-10-CM | POA: Diagnosis not present

## 2021-01-13 DIAGNOSIS — I4891 Unspecified atrial fibrillation: Secondary | ICD-10-CM

## 2021-01-16 DIAGNOSIS — N390 Urinary tract infection, site not specified: Secondary | ICD-10-CM

## 2021-01-20 ENCOUNTER — Other Ambulatory Visit: Payer: Self-pay

## 2021-01-20 DIAGNOSIS — N179 Acute kidney failure, unspecified: Secondary | ICD-10-CM | POA: Diagnosis not present

## 2021-01-20 DIAGNOSIS — R69 Illness, unspecified: Secondary | ICD-10-CM | POA: Diagnosis not present

## 2021-01-20 DIAGNOSIS — N320 Bladder-neck obstruction: Secondary | ICD-10-CM | POA: Diagnosis not present

## 2021-01-20 DIAGNOSIS — R7989 Other specified abnormal findings of blood chemistry: Secondary | ICD-10-CM | POA: Diagnosis not present

## 2021-01-20 DIAGNOSIS — E039 Hypothyroidism, unspecified: Secondary | ICD-10-CM | POA: Diagnosis not present

## 2021-01-20 DIAGNOSIS — I1310 Hypertensive heart and chronic kidney disease without heart failure, with stage 1 through stage 4 chronic kidney disease, or unspecified chronic kidney disease: Secondary | ICD-10-CM | POA: Diagnosis not present

## 2021-01-20 DIAGNOSIS — J449 Chronic obstructive pulmonary disease, unspecified: Secondary | ICD-10-CM | POA: Diagnosis not present

## 2021-01-20 DIAGNOSIS — E785 Hyperlipidemia, unspecified: Secondary | ICD-10-CM | POA: Insufficient documentation

## 2021-01-20 DIAGNOSIS — I219 Acute myocardial infarction, unspecified: Secondary | ICD-10-CM | POA: Insufficient documentation

## 2021-01-20 DIAGNOSIS — K029 Dental caries, unspecified: Secondary | ICD-10-CM | POA: Diagnosis not present

## 2021-01-20 DIAGNOSIS — E1122 Type 2 diabetes mellitus with diabetic chronic kidney disease: Secondary | ICD-10-CM | POA: Diagnosis not present

## 2021-01-20 DIAGNOSIS — E1110 Type 2 diabetes mellitus with ketoacidosis without coma: Secondary | ICD-10-CM | POA: Diagnosis not present

## 2021-01-20 DIAGNOSIS — E11649 Type 2 diabetes mellitus with hypoglycemia without coma: Secondary | ICD-10-CM | POA: Diagnosis not present

## 2021-01-20 DIAGNOSIS — I509 Heart failure, unspecified: Secondary | ICD-10-CM | POA: Diagnosis not present

## 2021-01-20 DIAGNOSIS — R279 Unspecified lack of coordination: Secondary | ICD-10-CM | POA: Diagnosis not present

## 2021-01-20 DIAGNOSIS — N390 Urinary tract infection, site not specified: Secondary | ICD-10-CM | POA: Diagnosis not present

## 2021-01-20 DIAGNOSIS — R410 Disorientation, unspecified: Secondary | ICD-10-CM | POA: Diagnosis not present

## 2021-01-20 DIAGNOSIS — R58 Hemorrhage, not elsewhere classified: Secondary | ICD-10-CM | POA: Diagnosis not present

## 2021-01-20 DIAGNOSIS — B9689 Other specified bacterial agents as the cause of diseases classified elsewhere: Secondary | ICD-10-CM | POA: Diagnosis not present

## 2021-01-20 DIAGNOSIS — D640 Hereditary sideroblastic anemia: Secondary | ICD-10-CM | POA: Diagnosis not present

## 2021-01-20 DIAGNOSIS — J45909 Unspecified asthma, uncomplicated: Secondary | ICD-10-CM | POA: Insufficient documentation

## 2021-01-20 DIAGNOSIS — R531 Weakness: Secondary | ICD-10-CM | POA: Diagnosis not present

## 2021-01-20 DIAGNOSIS — N184 Chronic kidney disease, stage 4 (severe): Secondary | ICD-10-CM | POA: Diagnosis not present

## 2021-01-20 DIAGNOSIS — N3090 Cystitis, unspecified without hematuria: Secondary | ICD-10-CM | POA: Diagnosis not present

## 2021-01-20 DIAGNOSIS — D631 Anemia in chronic kidney disease: Secondary | ICD-10-CM | POA: Diagnosis not present

## 2021-01-20 DIAGNOSIS — E860 Dehydration: Secondary | ICD-10-CM | POA: Diagnosis not present

## 2021-01-20 DIAGNOSIS — I499 Cardiac arrhythmia, unspecified: Secondary | ICD-10-CM | POA: Diagnosis not present

## 2021-01-20 DIAGNOSIS — R319 Hematuria, unspecified: Secondary | ICD-10-CM | POA: Diagnosis not present

## 2021-01-20 DIAGNOSIS — R300 Dysuria: Secondary | ICD-10-CM | POA: Diagnosis not present

## 2021-01-20 DIAGNOSIS — E1100 Type 2 diabetes mellitus with hyperosmolarity without nonketotic hyperglycemic-hyperosmolar coma (NKHHC): Secondary | ICD-10-CM | POA: Diagnosis not present

## 2021-01-20 DIAGNOSIS — N3001 Acute cystitis with hematuria: Secondary | ICD-10-CM | POA: Diagnosis not present

## 2021-01-20 DIAGNOSIS — K7689 Other specified diseases of liver: Secondary | ICD-10-CM | POA: Diagnosis not present

## 2021-01-20 DIAGNOSIS — E1165 Type 2 diabetes mellitus with hyperglycemia: Secondary | ICD-10-CM | POA: Diagnosis not present

## 2021-01-20 DIAGNOSIS — I10 Essential (primary) hypertension: Secondary | ICD-10-CM | POA: Diagnosis not present

## 2021-01-20 DIAGNOSIS — N289 Disorder of kidney and ureter, unspecified: Secondary | ICD-10-CM | POA: Diagnosis not present

## 2021-01-20 DIAGNOSIS — I4891 Unspecified atrial fibrillation: Secondary | ICD-10-CM | POA: Diagnosis not present

## 2021-01-20 DIAGNOSIS — I2510 Atherosclerotic heart disease of native coronary artery without angina pectoris: Secondary | ICD-10-CM | POA: Diagnosis not present

## 2021-01-20 DIAGNOSIS — E119 Type 2 diabetes mellitus without complications: Secondary | ICD-10-CM | POA: Diagnosis not present

## 2021-01-20 DIAGNOSIS — Z743 Need for continuous supervision: Secondary | ICD-10-CM | POA: Diagnosis not present

## 2021-01-20 DIAGNOSIS — G9341 Metabolic encephalopathy: Secondary | ICD-10-CM | POA: Diagnosis not present

## 2021-01-20 DIAGNOSIS — A419 Sepsis, unspecified organism: Secondary | ICD-10-CM | POA: Diagnosis not present

## 2021-01-20 DIAGNOSIS — N3289 Other specified disorders of bladder: Secondary | ICD-10-CM | POA: Diagnosis not present

## 2021-01-20 DIAGNOSIS — E559 Vitamin D deficiency, unspecified: Secondary | ICD-10-CM | POA: Diagnosis not present

## 2021-01-20 DIAGNOSIS — K0889 Other specified disorders of teeth and supporting structures: Secondary | ICD-10-CM | POA: Diagnosis not present

## 2021-01-20 DIAGNOSIS — Z79899 Other long term (current) drug therapy: Secondary | ICD-10-CM | POA: Diagnosis not present

## 2021-01-20 DIAGNOSIS — E538 Deficiency of other specified B group vitamins: Secondary | ICD-10-CM | POA: Diagnosis not present

## 2021-01-20 DIAGNOSIS — D649 Anemia, unspecified: Secondary | ICD-10-CM | POA: Diagnosis not present

## 2021-01-20 DIAGNOSIS — R0602 Shortness of breath: Secondary | ICD-10-CM | POA: Insufficient documentation

## 2021-01-21 DIAGNOSIS — I1310 Hypertensive heart and chronic kidney disease without heart failure, with stage 1 through stage 4 chronic kidney disease, or unspecified chronic kidney disease: Secondary | ICD-10-CM | POA: Diagnosis not present

## 2021-01-21 DIAGNOSIS — J449 Chronic obstructive pulmonary disease, unspecified: Secondary | ICD-10-CM | POA: Diagnosis not present

## 2021-01-21 DIAGNOSIS — I2510 Atherosclerotic heart disease of native coronary artery without angina pectoris: Secondary | ICD-10-CM | POA: Diagnosis not present

## 2021-01-21 DIAGNOSIS — N390 Urinary tract infection, site not specified: Secondary | ICD-10-CM | POA: Diagnosis not present

## 2021-01-21 DIAGNOSIS — D631 Anemia in chronic kidney disease: Secondary | ICD-10-CM | POA: Diagnosis not present

## 2021-01-21 DIAGNOSIS — E039 Hypothyroidism, unspecified: Secondary | ICD-10-CM | POA: Diagnosis not present

## 2021-01-21 DIAGNOSIS — N184 Chronic kidney disease, stage 4 (severe): Secondary | ICD-10-CM | POA: Diagnosis not present

## 2021-01-21 DIAGNOSIS — E538 Deficiency of other specified B group vitamins: Secondary | ICD-10-CM | POA: Diagnosis not present

## 2021-01-21 DIAGNOSIS — E1122 Type 2 diabetes mellitus with diabetic chronic kidney disease: Secondary | ICD-10-CM | POA: Diagnosis not present

## 2021-01-23 DIAGNOSIS — N390 Urinary tract infection, site not specified: Secondary | ICD-10-CM | POA: Diagnosis not present

## 2021-01-23 DIAGNOSIS — E1165 Type 2 diabetes mellitus with hyperglycemia: Secondary | ICD-10-CM | POA: Diagnosis not present

## 2021-01-23 DIAGNOSIS — I2510 Atherosclerotic heart disease of native coronary artery without angina pectoris: Secondary | ICD-10-CM | POA: Diagnosis not present

## 2021-01-23 DIAGNOSIS — E039 Hypothyroidism, unspecified: Secondary | ICD-10-CM | POA: Diagnosis not present

## 2021-01-23 DIAGNOSIS — E1122 Type 2 diabetes mellitus with diabetic chronic kidney disease: Secondary | ICD-10-CM | POA: Diagnosis not present

## 2021-01-29 ENCOUNTER — Ambulatory Visit: Payer: Medicare HMO | Admitting: Cardiology

## 2021-01-29 DIAGNOSIS — K0889 Other specified disorders of teeth and supporting structures: Secondary | ICD-10-CM | POA: Diagnosis not present

## 2021-01-29 DIAGNOSIS — E860 Dehydration: Secondary | ICD-10-CM | POA: Diagnosis not present

## 2021-01-29 DIAGNOSIS — N320 Bladder-neck obstruction: Secondary | ICD-10-CM | POA: Diagnosis not present

## 2021-01-29 DIAGNOSIS — R7989 Other specified abnormal findings of blood chemistry: Secondary | ICD-10-CM | POA: Diagnosis not present

## 2021-01-29 DIAGNOSIS — N184 Chronic kidney disease, stage 4 (severe): Secondary | ICD-10-CM | POA: Diagnosis not present

## 2021-01-29 DIAGNOSIS — K7689 Other specified diseases of liver: Secondary | ICD-10-CM | POA: Diagnosis not present

## 2021-01-29 DIAGNOSIS — I1310 Hypertensive heart and chronic kidney disease without heart failure, with stage 1 through stage 4 chronic kidney disease, or unspecified chronic kidney disease: Secondary | ICD-10-CM | POA: Diagnosis not present

## 2021-01-29 DIAGNOSIS — N289 Disorder of kidney and ureter, unspecified: Secondary | ICD-10-CM | POA: Diagnosis not present

## 2021-01-29 DIAGNOSIS — E1122 Type 2 diabetes mellitus with diabetic chronic kidney disease: Secondary | ICD-10-CM | POA: Diagnosis not present

## 2021-01-29 DIAGNOSIS — K029 Dental caries, unspecified: Secondary | ICD-10-CM | POA: Diagnosis not present

## 2021-01-29 DIAGNOSIS — R300 Dysuria: Secondary | ICD-10-CM | POA: Diagnosis not present

## 2021-01-29 DIAGNOSIS — E11649 Type 2 diabetes mellitus with hypoglycemia without coma: Secondary | ICD-10-CM | POA: Diagnosis not present

## 2021-01-29 DIAGNOSIS — D649 Anemia, unspecified: Secondary | ICD-10-CM | POA: Diagnosis not present

## 2021-01-29 DIAGNOSIS — B9689 Other specified bacterial agents as the cause of diseases classified elsewhere: Secondary | ICD-10-CM | POA: Diagnosis not present

## 2021-01-29 DIAGNOSIS — N3001 Acute cystitis with hematuria: Secondary | ICD-10-CM | POA: Diagnosis not present

## 2021-01-29 DIAGNOSIS — R69 Illness, unspecified: Secondary | ICD-10-CM | POA: Diagnosis not present

## 2021-01-29 DIAGNOSIS — N3289 Other specified disorders of bladder: Secondary | ICD-10-CM | POA: Diagnosis not present

## 2021-01-29 DIAGNOSIS — R531 Weakness: Secondary | ICD-10-CM | POA: Diagnosis not present

## 2021-01-29 DIAGNOSIS — D631 Anemia in chronic kidney disease: Secondary | ICD-10-CM | POA: Diagnosis not present

## 2021-01-29 DIAGNOSIS — R319 Hematuria, unspecified: Secondary | ICD-10-CM | POA: Diagnosis not present

## 2021-02-03 DIAGNOSIS — R319 Hematuria, unspecified: Secondary | ICD-10-CM | POA: Diagnosis not present

## 2021-02-03 DIAGNOSIS — D631 Anemia in chronic kidney disease: Secondary | ICD-10-CM | POA: Diagnosis not present

## 2021-02-03 DIAGNOSIS — N390 Urinary tract infection, site not specified: Secondary | ICD-10-CM | POA: Diagnosis not present

## 2021-02-03 DIAGNOSIS — I1310 Hypertensive heart and chronic kidney disease without heart failure, with stage 1 through stage 4 chronic kidney disease, or unspecified chronic kidney disease: Secondary | ICD-10-CM | POA: Diagnosis not present

## 2021-02-03 DIAGNOSIS — N184 Chronic kidney disease, stage 4 (severe): Secondary | ICD-10-CM | POA: Diagnosis not present

## 2021-02-03 DIAGNOSIS — E1122 Type 2 diabetes mellitus with diabetic chronic kidney disease: Secondary | ICD-10-CM | POA: Diagnosis not present

## 2021-02-07 DIAGNOSIS — E1165 Type 2 diabetes mellitus with hyperglycemia: Secondary | ICD-10-CM | POA: Diagnosis not present

## 2021-02-07 DIAGNOSIS — D649 Anemia, unspecified: Secondary | ICD-10-CM | POA: Diagnosis not present

## 2021-02-07 DIAGNOSIS — N3090 Cystitis, unspecified without hematuria: Secondary | ICD-10-CM | POA: Diagnosis not present

## 2021-02-07 DIAGNOSIS — I2510 Atherosclerotic heart disease of native coronary artery without angina pectoris: Secondary | ICD-10-CM | POA: Diagnosis not present

## 2021-02-07 DIAGNOSIS — R319 Hematuria, unspecified: Secondary | ICD-10-CM | POA: Diagnosis not present

## 2021-02-07 DIAGNOSIS — J449 Chronic obstructive pulmonary disease, unspecified: Secondary | ICD-10-CM | POA: Diagnosis not present

## 2021-02-10 DIAGNOSIS — I10 Essential (primary) hypertension: Secondary | ICD-10-CM | POA: Diagnosis not present

## 2021-02-10 DIAGNOSIS — R1084 Generalized abdominal pain: Secondary | ICD-10-CM | POA: Diagnosis not present

## 2021-02-10 DIAGNOSIS — E785 Hyperlipidemia, unspecified: Secondary | ICD-10-CM | POA: Diagnosis not present

## 2021-02-10 DIAGNOSIS — N2889 Other specified disorders of kidney and ureter: Secondary | ICD-10-CM | POA: Diagnosis not present

## 2021-02-10 DIAGNOSIS — R339 Retention of urine, unspecified: Secondary | ICD-10-CM | POA: Diagnosis not present

## 2021-02-10 DIAGNOSIS — Z8673 Personal history of transient ischemic attack (TIA), and cerebral infarction without residual deficits: Secondary | ICD-10-CM | POA: Diagnosis not present

## 2021-02-10 DIAGNOSIS — I2510 Atherosclerotic heart disease of native coronary artery without angina pectoris: Secondary | ICD-10-CM | POA: Diagnosis not present

## 2021-02-10 DIAGNOSIS — I130 Hypertensive heart and chronic kidney disease with heart failure and stage 1 through stage 4 chronic kidney disease, or unspecified chronic kidney disease: Secondary | ICD-10-CM | POA: Diagnosis not present

## 2021-02-10 DIAGNOSIS — N184 Chronic kidney disease, stage 4 (severe): Secondary | ICD-10-CM | POA: Diagnosis not present

## 2021-02-10 DIAGNOSIS — J449 Chronic obstructive pulmonary disease, unspecified: Secondary | ICD-10-CM | POA: Diagnosis not present

## 2021-02-10 DIAGNOSIS — K5900 Constipation, unspecified: Secondary | ICD-10-CM | POA: Diagnosis not present

## 2021-02-10 DIAGNOSIS — E039 Hypothyroidism, unspecified: Secondary | ICD-10-CM | POA: Diagnosis not present

## 2021-02-10 DIAGNOSIS — E1122 Type 2 diabetes mellitus with diabetic chronic kidney disease: Secondary | ICD-10-CM | POA: Diagnosis not present

## 2021-02-10 DIAGNOSIS — N3289 Other specified disorders of bladder: Secondary | ICD-10-CM | POA: Diagnosis not present

## 2021-02-10 DIAGNOSIS — Z743 Need for continuous supervision: Secondary | ICD-10-CM | POA: Diagnosis not present

## 2021-02-10 DIAGNOSIS — R1032 Left lower quadrant pain: Secondary | ICD-10-CM | POA: Diagnosis not present

## 2021-02-10 DIAGNOSIS — R338 Other retention of urine: Secondary | ICD-10-CM | POA: Diagnosis not present

## 2021-02-10 DIAGNOSIS — K5289 Other specified noninfective gastroenteritis and colitis: Secondary | ICD-10-CM | POA: Diagnosis not present

## 2021-02-11 DIAGNOSIS — I130 Hypertensive heart and chronic kidney disease with heart failure and stage 1 through stage 4 chronic kidney disease, or unspecified chronic kidney disease: Secondary | ICD-10-CM | POA: Diagnosis not present

## 2021-02-11 DIAGNOSIS — N184 Chronic kidney disease, stage 4 (severe): Secondary | ICD-10-CM | POA: Diagnosis not present

## 2021-02-11 DIAGNOSIS — K5289 Other specified noninfective gastroenteritis and colitis: Secondary | ICD-10-CM | POA: Diagnosis not present

## 2021-02-13 DIAGNOSIS — E118 Type 2 diabetes mellitus with unspecified complications: Secondary | ICD-10-CM | POA: Diagnosis not present

## 2021-02-13 DIAGNOSIS — D649 Anemia, unspecified: Secondary | ICD-10-CM | POA: Diagnosis not present

## 2021-02-13 DIAGNOSIS — E063 Autoimmune thyroiditis: Secondary | ICD-10-CM | POA: Diagnosis not present

## 2021-02-13 DIAGNOSIS — Z139 Encounter for screening, unspecified: Secondary | ICD-10-CM | POA: Diagnosis not present

## 2021-02-13 DIAGNOSIS — E785 Hyperlipidemia, unspecified: Secondary | ICD-10-CM | POA: Diagnosis not present

## 2021-02-13 DIAGNOSIS — N390 Urinary tract infection, site not specified: Secondary | ICD-10-CM | POA: Diagnosis not present

## 2021-02-13 DIAGNOSIS — N189 Chronic kidney disease, unspecified: Secondary | ICD-10-CM | POA: Diagnosis not present

## 2021-02-13 DIAGNOSIS — Z6827 Body mass index (BMI) 27.0-27.9, adult: Secondary | ICD-10-CM | POA: Diagnosis not present

## 2021-02-13 DIAGNOSIS — R5381 Other malaise: Secondary | ICD-10-CM | POA: Diagnosis not present

## 2021-02-13 DIAGNOSIS — Z9181 History of falling: Secondary | ICD-10-CM | POA: Diagnosis not present

## 2021-02-13 DIAGNOSIS — Z1331 Encounter for screening for depression: Secondary | ICD-10-CM | POA: Diagnosis not present

## 2021-02-13 DIAGNOSIS — Z79899 Other long term (current) drug therapy: Secondary | ICD-10-CM | POA: Diagnosis not present

## 2021-02-17 DIAGNOSIS — I69354 Hemiplegia and hemiparesis following cerebral infarction affecting left non-dominant side: Secondary | ICD-10-CM | POA: Diagnosis not present

## 2021-02-17 DIAGNOSIS — K2100 Gastro-esophageal reflux disease with esophagitis, without bleeding: Secondary | ICD-10-CM | POA: Diagnosis not present

## 2021-02-17 DIAGNOSIS — I2510 Atherosclerotic heart disease of native coronary artery without angina pectoris: Secondary | ICD-10-CM | POA: Diagnosis not present

## 2021-02-17 DIAGNOSIS — N390 Urinary tract infection, site not specified: Secondary | ICD-10-CM | POA: Diagnosis not present

## 2021-02-17 DIAGNOSIS — N319 Neuromuscular dysfunction of bladder, unspecified: Secondary | ICD-10-CM | POA: Diagnosis not present

## 2021-02-17 DIAGNOSIS — Z79899 Other long term (current) drug therapy: Secondary | ICD-10-CM | POA: Diagnosis not present

## 2021-02-17 DIAGNOSIS — E538 Deficiency of other specified B group vitamins: Secondary | ICD-10-CM | POA: Diagnosis not present

## 2021-02-17 DIAGNOSIS — J449 Chronic obstructive pulmonary disease, unspecified: Secondary | ICD-10-CM | POA: Diagnosis not present

## 2021-02-17 DIAGNOSIS — I69398 Other sequelae of cerebral infarction: Secondary | ICD-10-CM | POA: Diagnosis not present

## 2021-02-17 DIAGNOSIS — N760 Acute vaginitis: Secondary | ICD-10-CM | POA: Diagnosis not present

## 2021-02-17 DIAGNOSIS — E1122 Type 2 diabetes mellitus with diabetic chronic kidney disease: Secondary | ICD-10-CM | POA: Diagnosis not present

## 2021-02-17 DIAGNOSIS — N189 Chronic kidney disease, unspecified: Secondary | ICD-10-CM | POA: Diagnosis not present

## 2021-02-17 DIAGNOSIS — N3946 Mixed incontinence: Secondary | ICD-10-CM | POA: Diagnosis not present

## 2021-02-17 DIAGNOSIS — R310 Gross hematuria: Secondary | ICD-10-CM | POA: Diagnosis not present

## 2021-02-17 DIAGNOSIS — R339 Retention of urine, unspecified: Secondary | ICD-10-CM | POA: Diagnosis not present

## 2021-02-17 DIAGNOSIS — N184 Chronic kidney disease, stage 4 (severe): Secondary | ICD-10-CM | POA: Diagnosis not present

## 2021-02-17 DIAGNOSIS — I130 Hypertensive heart and chronic kidney disease with heart failure and stage 1 through stage 4 chronic kidney disease, or unspecified chronic kidney disease: Secondary | ICD-10-CM | POA: Diagnosis not present

## 2021-02-17 DIAGNOSIS — I509 Heart failure, unspecified: Secondary | ICD-10-CM | POA: Diagnosis not present

## 2021-02-17 DIAGNOSIS — D631 Anemia in chronic kidney disease: Secondary | ICD-10-CM | POA: Diagnosis not present

## 2021-02-27 ENCOUNTER — Other Ambulatory Visit: Payer: Self-pay | Admitting: *Deleted

## 2021-02-27 DIAGNOSIS — I2510 Atherosclerotic heart disease of native coronary artery without angina pectoris: Secondary | ICD-10-CM | POA: Diagnosis not present

## 2021-02-27 DIAGNOSIS — J449 Chronic obstructive pulmonary disease, unspecified: Secondary | ICD-10-CM | POA: Diagnosis not present

## 2021-02-27 DIAGNOSIS — I130 Hypertensive heart and chronic kidney disease with heart failure and stage 1 through stage 4 chronic kidney disease, or unspecified chronic kidney disease: Secondary | ICD-10-CM | POA: Diagnosis not present

## 2021-02-27 DIAGNOSIS — E538 Deficiency of other specified B group vitamins: Secondary | ICD-10-CM | POA: Diagnosis not present

## 2021-02-27 DIAGNOSIS — I509 Heart failure, unspecified: Secondary | ICD-10-CM | POA: Diagnosis not present

## 2021-02-27 DIAGNOSIS — E1122 Type 2 diabetes mellitus with diabetic chronic kidney disease: Secondary | ICD-10-CM | POA: Diagnosis not present

## 2021-02-27 DIAGNOSIS — D631 Anemia in chronic kidney disease: Secondary | ICD-10-CM | POA: Diagnosis not present

## 2021-02-27 DIAGNOSIS — N390 Urinary tract infection, site not specified: Secondary | ICD-10-CM | POA: Diagnosis not present

## 2021-02-27 DIAGNOSIS — E785 Hyperlipidemia, unspecified: Secondary | ICD-10-CM

## 2021-02-27 DIAGNOSIS — N189 Chronic kidney disease, unspecified: Secondary | ICD-10-CM | POA: Diagnosis not present

## 2021-02-27 DIAGNOSIS — Z951 Presence of aortocoronary bypass graft: Secondary | ICD-10-CM

## 2021-02-27 DIAGNOSIS — D649 Anemia, unspecified: Secondary | ICD-10-CM | POA: Diagnosis not present

## 2021-02-27 DIAGNOSIS — K2100 Gastro-esophageal reflux disease with esophagitis, without bleeding: Secondary | ICD-10-CM | POA: Diagnosis not present

## 2021-03-02 DIAGNOSIS — J449 Chronic obstructive pulmonary disease, unspecified: Secondary | ICD-10-CM | POA: Diagnosis not present

## 2021-03-02 DIAGNOSIS — K2100 Gastro-esophageal reflux disease with esophagitis, without bleeding: Secondary | ICD-10-CM | POA: Diagnosis not present

## 2021-03-02 DIAGNOSIS — I509 Heart failure, unspecified: Secondary | ICD-10-CM | POA: Diagnosis not present

## 2021-03-02 DIAGNOSIS — E1122 Type 2 diabetes mellitus with diabetic chronic kidney disease: Secondary | ICD-10-CM | POA: Diagnosis not present

## 2021-03-02 DIAGNOSIS — N189 Chronic kidney disease, unspecified: Secondary | ICD-10-CM | POA: Diagnosis not present

## 2021-03-02 DIAGNOSIS — N390 Urinary tract infection, site not specified: Secondary | ICD-10-CM | POA: Diagnosis not present

## 2021-03-02 DIAGNOSIS — I2510 Atherosclerotic heart disease of native coronary artery without angina pectoris: Secondary | ICD-10-CM | POA: Diagnosis not present

## 2021-03-02 DIAGNOSIS — D631 Anemia in chronic kidney disease: Secondary | ICD-10-CM | POA: Diagnosis not present

## 2021-03-02 DIAGNOSIS — I130 Hypertensive heart and chronic kidney disease with heart failure and stage 1 through stage 4 chronic kidney disease, or unspecified chronic kidney disease: Secondary | ICD-10-CM | POA: Diagnosis not present

## 2021-03-02 DIAGNOSIS — E538 Deficiency of other specified B group vitamins: Secondary | ICD-10-CM | POA: Diagnosis not present

## 2021-03-05 DIAGNOSIS — D631 Anemia in chronic kidney disease: Secondary | ICD-10-CM | POA: Diagnosis not present

## 2021-03-05 DIAGNOSIS — E538 Deficiency of other specified B group vitamins: Secondary | ICD-10-CM | POA: Diagnosis not present

## 2021-03-05 DIAGNOSIS — I130 Hypertensive heart and chronic kidney disease with heart failure and stage 1 through stage 4 chronic kidney disease, or unspecified chronic kidney disease: Secondary | ICD-10-CM | POA: Diagnosis not present

## 2021-03-05 DIAGNOSIS — I509 Heart failure, unspecified: Secondary | ICD-10-CM | POA: Diagnosis not present

## 2021-03-05 DIAGNOSIS — N390 Urinary tract infection, site not specified: Secondary | ICD-10-CM | POA: Diagnosis not present

## 2021-03-05 DIAGNOSIS — N189 Chronic kidney disease, unspecified: Secondary | ICD-10-CM | POA: Diagnosis not present

## 2021-03-05 DIAGNOSIS — E1122 Type 2 diabetes mellitus with diabetic chronic kidney disease: Secondary | ICD-10-CM | POA: Diagnosis not present

## 2021-03-05 DIAGNOSIS — J449 Chronic obstructive pulmonary disease, unspecified: Secondary | ICD-10-CM | POA: Diagnosis not present

## 2021-03-05 DIAGNOSIS — K2100 Gastro-esophageal reflux disease with esophagitis, without bleeding: Secondary | ICD-10-CM | POA: Diagnosis not present

## 2021-03-05 DIAGNOSIS — I2510 Atherosclerotic heart disease of native coronary artery without angina pectoris: Secondary | ICD-10-CM | POA: Diagnosis not present

## 2021-03-09 DIAGNOSIS — N189 Chronic kidney disease, unspecified: Secondary | ICD-10-CM | POA: Diagnosis not present

## 2021-03-09 DIAGNOSIS — D631 Anemia in chronic kidney disease: Secondary | ICD-10-CM | POA: Diagnosis not present

## 2021-03-09 DIAGNOSIS — E538 Deficiency of other specified B group vitamins: Secondary | ICD-10-CM | POA: Diagnosis not present

## 2021-03-09 DIAGNOSIS — N390 Urinary tract infection, site not specified: Secondary | ICD-10-CM | POA: Diagnosis not present

## 2021-03-09 DIAGNOSIS — K2100 Gastro-esophageal reflux disease with esophagitis, without bleeding: Secondary | ICD-10-CM | POA: Diagnosis not present

## 2021-03-09 DIAGNOSIS — J449 Chronic obstructive pulmonary disease, unspecified: Secondary | ICD-10-CM | POA: Diagnosis not present

## 2021-03-09 DIAGNOSIS — I130 Hypertensive heart and chronic kidney disease with heart failure and stage 1 through stage 4 chronic kidney disease, or unspecified chronic kidney disease: Secondary | ICD-10-CM | POA: Diagnosis not present

## 2021-03-09 DIAGNOSIS — E1122 Type 2 diabetes mellitus with diabetic chronic kidney disease: Secondary | ICD-10-CM | POA: Diagnosis not present

## 2021-03-09 DIAGNOSIS — I509 Heart failure, unspecified: Secondary | ICD-10-CM | POA: Diagnosis not present

## 2021-03-09 DIAGNOSIS — I2510 Atherosclerotic heart disease of native coronary artery without angina pectoris: Secondary | ICD-10-CM | POA: Diagnosis not present

## 2021-03-12 DIAGNOSIS — I130 Hypertensive heart and chronic kidney disease with heart failure and stage 1 through stage 4 chronic kidney disease, or unspecified chronic kidney disease: Secondary | ICD-10-CM | POA: Diagnosis not present

## 2021-03-12 DIAGNOSIS — E1122 Type 2 diabetes mellitus with diabetic chronic kidney disease: Secondary | ICD-10-CM | POA: Diagnosis not present

## 2021-03-12 DIAGNOSIS — D631 Anemia in chronic kidney disease: Secondary | ICD-10-CM | POA: Diagnosis not present

## 2021-03-12 DIAGNOSIS — N189 Chronic kidney disease, unspecified: Secondary | ICD-10-CM | POA: Diagnosis not present

## 2021-03-12 DIAGNOSIS — N390 Urinary tract infection, site not specified: Secondary | ICD-10-CM | POA: Diagnosis not present

## 2021-03-12 DIAGNOSIS — K2100 Gastro-esophageal reflux disease with esophagitis, without bleeding: Secondary | ICD-10-CM | POA: Diagnosis not present

## 2021-03-12 DIAGNOSIS — E538 Deficiency of other specified B group vitamins: Secondary | ICD-10-CM | POA: Diagnosis not present

## 2021-03-12 DIAGNOSIS — I509 Heart failure, unspecified: Secondary | ICD-10-CM | POA: Diagnosis not present

## 2021-03-12 DIAGNOSIS — I2510 Atherosclerotic heart disease of native coronary artery without angina pectoris: Secondary | ICD-10-CM | POA: Diagnosis not present

## 2021-03-12 DIAGNOSIS — J449 Chronic obstructive pulmonary disease, unspecified: Secondary | ICD-10-CM | POA: Diagnosis not present

## 2021-03-14 DIAGNOSIS — K5901 Slow transit constipation: Secondary | ICD-10-CM | POA: Diagnosis not present

## 2021-03-14 DIAGNOSIS — N184 Chronic kidney disease, stage 4 (severe): Secondary | ICD-10-CM | POA: Diagnosis not present

## 2021-03-14 DIAGNOSIS — Z951 Presence of aortocoronary bypass graft: Secondary | ICD-10-CM | POA: Diagnosis not present

## 2021-03-14 DIAGNOSIS — I2510 Atherosclerotic heart disease of native coronary artery without angina pectoris: Secondary | ICD-10-CM | POA: Diagnosis not present

## 2021-03-14 DIAGNOSIS — J449 Chronic obstructive pulmonary disease, unspecified: Secondary | ICD-10-CM | POA: Diagnosis not present

## 2021-03-14 DIAGNOSIS — E872 Acidosis: Secondary | ICD-10-CM | POA: Diagnosis not present

## 2021-03-14 DIAGNOSIS — K5790 Diverticulosis of intestine, part unspecified, without perforation or abscess without bleeding: Secondary | ICD-10-CM | POA: Diagnosis not present

## 2021-03-14 DIAGNOSIS — K7689 Other specified diseases of liver: Secondary | ICD-10-CM | POA: Diagnosis not present

## 2021-03-14 DIAGNOSIS — R52 Pain, unspecified: Secondary | ICD-10-CM | POA: Diagnosis not present

## 2021-03-14 DIAGNOSIS — I700 Atherosclerosis of aorta: Secondary | ICD-10-CM | POA: Diagnosis not present

## 2021-03-14 DIAGNOSIS — E1165 Type 2 diabetes mellitus with hyperglycemia: Secondary | ICD-10-CM | POA: Diagnosis not present

## 2021-03-14 DIAGNOSIS — R7989 Other specified abnormal findings of blood chemistry: Secondary | ICD-10-CM | POA: Diagnosis not present

## 2021-03-14 DIAGNOSIS — I10 Essential (primary) hypertension: Secondary | ICD-10-CM | POA: Diagnosis not present

## 2021-03-14 DIAGNOSIS — I129 Hypertensive chronic kidney disease with stage 1 through stage 4 chronic kidney disease, or unspecified chronic kidney disease: Secondary | ICD-10-CM | POA: Diagnosis not present

## 2021-03-14 DIAGNOSIS — K5900 Constipation, unspecified: Secondary | ICD-10-CM | POA: Diagnosis not present

## 2021-03-14 DIAGNOSIS — I160 Hypertensive urgency: Secondary | ICD-10-CM | POA: Diagnosis not present

## 2021-03-14 DIAGNOSIS — Z743 Need for continuous supervision: Secondary | ICD-10-CM | POA: Diagnosis not present

## 2021-03-14 DIAGNOSIS — R531 Weakness: Secondary | ICD-10-CM | POA: Diagnosis not present

## 2021-03-15 DIAGNOSIS — I700 Atherosclerosis of aorta: Secondary | ICD-10-CM | POA: Diagnosis not present

## 2021-03-15 DIAGNOSIS — K5790 Diverticulosis of intestine, part unspecified, without perforation or abscess without bleeding: Secondary | ICD-10-CM | POA: Diagnosis not present

## 2021-03-15 DIAGNOSIS — K7689 Other specified diseases of liver: Secondary | ICD-10-CM | POA: Diagnosis not present

## 2021-03-15 DIAGNOSIS — K5900 Constipation, unspecified: Secondary | ICD-10-CM | POA: Diagnosis not present

## 2021-03-16 DIAGNOSIS — D631 Anemia in chronic kidney disease: Secondary | ICD-10-CM | POA: Diagnosis not present

## 2021-03-16 DIAGNOSIS — I2510 Atherosclerotic heart disease of native coronary artery without angina pectoris: Secondary | ICD-10-CM | POA: Diagnosis not present

## 2021-03-16 DIAGNOSIS — E538 Deficiency of other specified B group vitamins: Secondary | ICD-10-CM | POA: Diagnosis not present

## 2021-03-16 DIAGNOSIS — I130 Hypertensive heart and chronic kidney disease with heart failure and stage 1 through stage 4 chronic kidney disease, or unspecified chronic kidney disease: Secondary | ICD-10-CM | POA: Diagnosis not present

## 2021-03-16 DIAGNOSIS — I509 Heart failure, unspecified: Secondary | ICD-10-CM | POA: Diagnosis not present

## 2021-03-16 DIAGNOSIS — K2100 Gastro-esophageal reflux disease with esophagitis, without bleeding: Secondary | ICD-10-CM | POA: Diagnosis not present

## 2021-03-16 DIAGNOSIS — N390 Urinary tract infection, site not specified: Secondary | ICD-10-CM | POA: Diagnosis not present

## 2021-03-16 DIAGNOSIS — J449 Chronic obstructive pulmonary disease, unspecified: Secondary | ICD-10-CM | POA: Diagnosis not present

## 2021-03-16 DIAGNOSIS — E1122 Type 2 diabetes mellitus with diabetic chronic kidney disease: Secondary | ICD-10-CM | POA: Diagnosis not present

## 2021-03-16 DIAGNOSIS — N189 Chronic kidney disease, unspecified: Secondary | ICD-10-CM | POA: Diagnosis not present

## 2021-03-17 DIAGNOSIS — K2100 Gastro-esophageal reflux disease with esophagitis, without bleeding: Secondary | ICD-10-CM | POA: Diagnosis not present

## 2021-03-17 DIAGNOSIS — D631 Anemia in chronic kidney disease: Secondary | ICD-10-CM | POA: Diagnosis not present

## 2021-03-17 DIAGNOSIS — N189 Chronic kidney disease, unspecified: Secondary | ICD-10-CM | POA: Diagnosis not present

## 2021-03-17 DIAGNOSIS — I480 Paroxysmal atrial fibrillation: Secondary | ICD-10-CM | POA: Diagnosis not present

## 2021-03-17 DIAGNOSIS — E1122 Type 2 diabetes mellitus with diabetic chronic kidney disease: Secondary | ICD-10-CM | POA: Diagnosis not present

## 2021-03-17 DIAGNOSIS — E538 Deficiency of other specified B group vitamins: Secondary | ICD-10-CM | POA: Diagnosis not present

## 2021-03-17 DIAGNOSIS — E875 Hyperkalemia: Secondary | ICD-10-CM | POA: Diagnosis not present

## 2021-03-17 DIAGNOSIS — J449 Chronic obstructive pulmonary disease, unspecified: Secondary | ICD-10-CM | POA: Diagnosis not present

## 2021-03-17 DIAGNOSIS — I509 Heart failure, unspecified: Secondary | ICD-10-CM | POA: Diagnosis not present

## 2021-03-17 DIAGNOSIS — E872 Acidosis: Secondary | ICD-10-CM | POA: Diagnosis not present

## 2021-03-17 DIAGNOSIS — I130 Hypertensive heart and chronic kidney disease with heart failure and stage 1 through stage 4 chronic kidney disease, or unspecified chronic kidney disease: Secondary | ICD-10-CM | POA: Diagnosis not present

## 2021-03-17 DIAGNOSIS — N1832 Chronic kidney disease, stage 3b: Secondary | ICD-10-CM | POA: Diagnosis not present

## 2021-03-17 DIAGNOSIS — N390 Urinary tract infection, site not specified: Secondary | ICD-10-CM | POA: Diagnosis not present

## 2021-03-17 DIAGNOSIS — I2510 Atherosclerotic heart disease of native coronary artery without angina pectoris: Secondary | ICD-10-CM | POA: Diagnosis not present

## 2021-03-17 DIAGNOSIS — I129 Hypertensive chronic kidney disease with stage 1 through stage 4 chronic kidney disease, or unspecified chronic kidney disease: Secondary | ICD-10-CM | POA: Diagnosis not present

## 2021-03-18 DIAGNOSIS — N390 Urinary tract infection, site not specified: Secondary | ICD-10-CM | POA: Diagnosis not present

## 2021-03-18 DIAGNOSIS — N3946 Mixed incontinence: Secondary | ICD-10-CM | POA: Diagnosis not present

## 2021-03-18 DIAGNOSIS — I69398 Other sequelae of cerebral infarction: Secondary | ICD-10-CM | POA: Diagnosis not present

## 2021-03-18 DIAGNOSIS — N319 Neuromuscular dysfunction of bladder, unspecified: Secondary | ICD-10-CM | POA: Diagnosis not present

## 2021-03-18 DIAGNOSIS — R310 Gross hematuria: Secondary | ICD-10-CM | POA: Diagnosis not present

## 2021-03-18 DIAGNOSIS — B9689 Other specified bacterial agents as the cause of diseases classified elsewhere: Secondary | ICD-10-CM | POA: Diagnosis not present

## 2021-03-18 DIAGNOSIS — N760 Acute vaginitis: Secondary | ICD-10-CM | POA: Diagnosis not present

## 2021-03-18 DIAGNOSIS — N184 Chronic kidney disease, stage 4 (severe): Secondary | ICD-10-CM | POA: Diagnosis not present

## 2021-03-18 DIAGNOSIS — N816 Rectocele: Secondary | ICD-10-CM | POA: Diagnosis not present

## 2021-03-18 DIAGNOSIS — R339 Retention of urine, unspecified: Secondary | ICD-10-CM | POA: Diagnosis not present

## 2021-03-19 DIAGNOSIS — N390 Urinary tract infection, site not specified: Secondary | ICD-10-CM | POA: Diagnosis not present

## 2021-03-19 DIAGNOSIS — K2100 Gastro-esophageal reflux disease with esophagitis, without bleeding: Secondary | ICD-10-CM | POA: Diagnosis not present

## 2021-03-19 DIAGNOSIS — J449 Chronic obstructive pulmonary disease, unspecified: Secondary | ICD-10-CM | POA: Diagnosis not present

## 2021-03-19 DIAGNOSIS — I130 Hypertensive heart and chronic kidney disease with heart failure and stage 1 through stage 4 chronic kidney disease, or unspecified chronic kidney disease: Secondary | ICD-10-CM | POA: Diagnosis not present

## 2021-03-19 DIAGNOSIS — N189 Chronic kidney disease, unspecified: Secondary | ICD-10-CM | POA: Diagnosis not present

## 2021-03-19 DIAGNOSIS — E1122 Type 2 diabetes mellitus with diabetic chronic kidney disease: Secondary | ICD-10-CM | POA: Diagnosis not present

## 2021-03-19 DIAGNOSIS — E538 Deficiency of other specified B group vitamins: Secondary | ICD-10-CM | POA: Diagnosis not present

## 2021-03-19 DIAGNOSIS — I509 Heart failure, unspecified: Secondary | ICD-10-CM | POA: Diagnosis not present

## 2021-03-19 DIAGNOSIS — D631 Anemia in chronic kidney disease: Secondary | ICD-10-CM | POA: Diagnosis not present

## 2021-03-19 DIAGNOSIS — I2510 Atherosclerotic heart disease of native coronary artery without angina pectoris: Secondary | ICD-10-CM | POA: Diagnosis not present

## 2021-03-20 DIAGNOSIS — I509 Heart failure, unspecified: Secondary | ICD-10-CM | POA: Diagnosis not present

## 2021-03-20 DIAGNOSIS — N189 Chronic kidney disease, unspecified: Secondary | ICD-10-CM | POA: Diagnosis not present

## 2021-03-20 DIAGNOSIS — E118 Type 2 diabetes mellitus with unspecified complications: Secondary | ICD-10-CM | POA: Diagnosis not present

## 2021-03-20 DIAGNOSIS — R748 Abnormal levels of other serum enzymes: Secondary | ICD-10-CM | POA: Diagnosis not present

## 2021-03-20 DIAGNOSIS — E538 Deficiency of other specified B group vitamins: Secondary | ICD-10-CM | POA: Diagnosis not present

## 2021-03-20 DIAGNOSIS — D649 Anemia, unspecified: Secondary | ICD-10-CM | POA: Diagnosis not present

## 2021-03-20 DIAGNOSIS — E785 Hyperlipidemia, unspecified: Secondary | ICD-10-CM | POA: Diagnosis not present

## 2021-03-20 DIAGNOSIS — Z6826 Body mass index (BMI) 26.0-26.9, adult: Secondary | ICD-10-CM | POA: Diagnosis not present

## 2021-03-20 DIAGNOSIS — K5900 Constipation, unspecified: Secondary | ICD-10-CM | POA: Diagnosis not present

## 2021-03-20 DIAGNOSIS — J449 Chronic obstructive pulmonary disease, unspecified: Secondary | ICD-10-CM | POA: Diagnosis not present

## 2021-03-25 DIAGNOSIS — I509 Heart failure, unspecified: Secondary | ICD-10-CM | POA: Diagnosis not present

## 2021-03-25 DIAGNOSIS — E1122 Type 2 diabetes mellitus with diabetic chronic kidney disease: Secondary | ICD-10-CM | POA: Diagnosis not present

## 2021-03-25 DIAGNOSIS — D631 Anemia in chronic kidney disease: Secondary | ICD-10-CM | POA: Diagnosis not present

## 2021-03-25 DIAGNOSIS — I2510 Atherosclerotic heart disease of native coronary artery without angina pectoris: Secondary | ICD-10-CM | POA: Diagnosis not present

## 2021-03-25 DIAGNOSIS — I130 Hypertensive heart and chronic kidney disease with heart failure and stage 1 through stage 4 chronic kidney disease, or unspecified chronic kidney disease: Secondary | ICD-10-CM | POA: Diagnosis not present

## 2021-03-25 DIAGNOSIS — N189 Chronic kidney disease, unspecified: Secondary | ICD-10-CM | POA: Diagnosis not present

## 2021-03-25 DIAGNOSIS — N390 Urinary tract infection, site not specified: Secondary | ICD-10-CM | POA: Diagnosis not present

## 2021-03-25 DIAGNOSIS — J449 Chronic obstructive pulmonary disease, unspecified: Secondary | ICD-10-CM | POA: Diagnosis not present

## 2021-03-25 DIAGNOSIS — E538 Deficiency of other specified B group vitamins: Secondary | ICD-10-CM | POA: Diagnosis not present

## 2021-03-25 DIAGNOSIS — K2100 Gastro-esophageal reflux disease with esophagitis, without bleeding: Secondary | ICD-10-CM | POA: Diagnosis not present

## 2021-03-26 DIAGNOSIS — E538 Deficiency of other specified B group vitamins: Secondary | ICD-10-CM | POA: Diagnosis not present

## 2021-03-26 DIAGNOSIS — N189 Chronic kidney disease, unspecified: Secondary | ICD-10-CM | POA: Diagnosis not present

## 2021-03-26 DIAGNOSIS — K2100 Gastro-esophageal reflux disease with esophagitis, without bleeding: Secondary | ICD-10-CM | POA: Diagnosis not present

## 2021-03-26 DIAGNOSIS — I509 Heart failure, unspecified: Secondary | ICD-10-CM | POA: Diagnosis not present

## 2021-03-26 DIAGNOSIS — D631 Anemia in chronic kidney disease: Secondary | ICD-10-CM | POA: Diagnosis not present

## 2021-03-26 DIAGNOSIS — J449 Chronic obstructive pulmonary disease, unspecified: Secondary | ICD-10-CM | POA: Diagnosis not present

## 2021-03-26 DIAGNOSIS — E1122 Type 2 diabetes mellitus with diabetic chronic kidney disease: Secondary | ICD-10-CM | POA: Diagnosis not present

## 2021-03-26 DIAGNOSIS — N390 Urinary tract infection, site not specified: Secondary | ICD-10-CM | POA: Diagnosis not present

## 2021-03-26 DIAGNOSIS — I2510 Atherosclerotic heart disease of native coronary artery without angina pectoris: Secondary | ICD-10-CM | POA: Diagnosis not present

## 2021-03-26 DIAGNOSIS — I130 Hypertensive heart and chronic kidney disease with heart failure and stage 1 through stage 4 chronic kidney disease, or unspecified chronic kidney disease: Secondary | ICD-10-CM | POA: Diagnosis not present

## 2021-03-31 DIAGNOSIS — I130 Hypertensive heart and chronic kidney disease with heart failure and stage 1 through stage 4 chronic kidney disease, or unspecified chronic kidney disease: Secondary | ICD-10-CM | POA: Diagnosis not present

## 2021-03-31 DIAGNOSIS — I509 Heart failure, unspecified: Secondary | ICD-10-CM | POA: Diagnosis not present

## 2021-03-31 DIAGNOSIS — N390 Urinary tract infection, site not specified: Secondary | ICD-10-CM | POA: Diagnosis not present

## 2021-03-31 DIAGNOSIS — D631 Anemia in chronic kidney disease: Secondary | ICD-10-CM | POA: Diagnosis not present

## 2021-03-31 DIAGNOSIS — E538 Deficiency of other specified B group vitamins: Secondary | ICD-10-CM | POA: Diagnosis not present

## 2021-03-31 DIAGNOSIS — E1122 Type 2 diabetes mellitus with diabetic chronic kidney disease: Secondary | ICD-10-CM | POA: Diagnosis not present

## 2021-03-31 DIAGNOSIS — I2510 Atherosclerotic heart disease of native coronary artery without angina pectoris: Secondary | ICD-10-CM | POA: Diagnosis not present

## 2021-03-31 DIAGNOSIS — K2100 Gastro-esophageal reflux disease with esophagitis, without bleeding: Secondary | ICD-10-CM | POA: Diagnosis not present

## 2021-03-31 DIAGNOSIS — N189 Chronic kidney disease, unspecified: Secondary | ICD-10-CM | POA: Diagnosis not present

## 2021-03-31 DIAGNOSIS — J449 Chronic obstructive pulmonary disease, unspecified: Secondary | ICD-10-CM | POA: Diagnosis not present

## 2021-04-02 DIAGNOSIS — N390 Urinary tract infection, site not specified: Secondary | ICD-10-CM | POA: Diagnosis not present

## 2021-04-02 DIAGNOSIS — N189 Chronic kidney disease, unspecified: Secondary | ICD-10-CM | POA: Diagnosis not present

## 2021-04-02 DIAGNOSIS — E538 Deficiency of other specified B group vitamins: Secondary | ICD-10-CM | POA: Diagnosis not present

## 2021-04-02 DIAGNOSIS — J449 Chronic obstructive pulmonary disease, unspecified: Secondary | ICD-10-CM | POA: Diagnosis not present

## 2021-04-02 DIAGNOSIS — E1122 Type 2 diabetes mellitus with diabetic chronic kidney disease: Secondary | ICD-10-CM | POA: Diagnosis not present

## 2021-04-02 DIAGNOSIS — D631 Anemia in chronic kidney disease: Secondary | ICD-10-CM | POA: Diagnosis not present

## 2021-04-02 DIAGNOSIS — I130 Hypertensive heart and chronic kidney disease with heart failure and stage 1 through stage 4 chronic kidney disease, or unspecified chronic kidney disease: Secondary | ICD-10-CM | POA: Diagnosis not present

## 2021-04-02 DIAGNOSIS — I509 Heart failure, unspecified: Secondary | ICD-10-CM | POA: Diagnosis not present

## 2021-04-02 DIAGNOSIS — I2510 Atherosclerotic heart disease of native coronary artery without angina pectoris: Secondary | ICD-10-CM | POA: Diagnosis not present

## 2021-04-02 DIAGNOSIS — K2100 Gastro-esophageal reflux disease with esophagitis, without bleeding: Secondary | ICD-10-CM | POA: Diagnosis not present

## 2021-04-07 ENCOUNTER — Telehealth: Payer: Self-pay | Admitting: Internal Medicine

## 2021-04-07 ENCOUNTER — Other Ambulatory Visit: Payer: Self-pay | Admitting: Cardiology

## 2021-04-07 DIAGNOSIS — I2510 Atherosclerotic heart disease of native coronary artery without angina pectoris: Secondary | ICD-10-CM | POA: Diagnosis not present

## 2021-04-07 DIAGNOSIS — E538 Deficiency of other specified B group vitamins: Secondary | ICD-10-CM | POA: Diagnosis not present

## 2021-04-07 DIAGNOSIS — I130 Hypertensive heart and chronic kidney disease with heart failure and stage 1 through stage 4 chronic kidney disease, or unspecified chronic kidney disease: Secondary | ICD-10-CM | POA: Diagnosis not present

## 2021-04-07 DIAGNOSIS — D631 Anemia in chronic kidney disease: Secondary | ICD-10-CM | POA: Diagnosis not present

## 2021-04-07 DIAGNOSIS — I509 Heart failure, unspecified: Secondary | ICD-10-CM | POA: Diagnosis not present

## 2021-04-07 DIAGNOSIS — J449 Chronic obstructive pulmonary disease, unspecified: Secondary | ICD-10-CM | POA: Diagnosis not present

## 2021-04-07 DIAGNOSIS — K2100 Gastro-esophageal reflux disease with esophagitis, without bleeding: Secondary | ICD-10-CM | POA: Diagnosis not present

## 2021-04-07 DIAGNOSIS — E1122 Type 2 diabetes mellitus with diabetic chronic kidney disease: Secondary | ICD-10-CM | POA: Diagnosis not present

## 2021-04-07 DIAGNOSIS — N189 Chronic kidney disease, unspecified: Secondary | ICD-10-CM | POA: Diagnosis not present

## 2021-04-07 DIAGNOSIS — N390 Urinary tract infection, site not specified: Secondary | ICD-10-CM | POA: Diagnosis not present

## 2021-04-07 MED ORDER — ROSUVASTATIN CALCIUM 20 MG PO TABS: 20.0000 mg | ORAL_TABLET | Freq: Every day | ORAL | 3 refills | Status: DC

## 2021-04-07 NOTE — Telephone Encounter (Signed)
Patient's daughter returned call. Notified her of lab results and MD recommendations. Rx(s) sent to pharmacy electronically.

## 2021-04-07 NOTE — Telephone Encounter (Signed)
Received copy of labs from [facility]  Lab work from 03/20/2021 Total cholesterol 79 Trigs: 61 HDL: 45 LDL: 20  Dr. Debara Pickett would advise patient decrease crestor to 20mg  daily  Left message for patient to call back to discuss

## 2021-04-07 NOTE — Addendum Note (Signed)
Addended by: Fidel Levy on: 04/07/2021 04:26 PM   Modules accepted: Orders

## 2021-04-08 DIAGNOSIS — I130 Hypertensive heart and chronic kidney disease with heart failure and stage 1 through stage 4 chronic kidney disease, or unspecified chronic kidney disease: Secondary | ICD-10-CM | POA: Diagnosis not present

## 2021-04-08 DIAGNOSIS — J449 Chronic obstructive pulmonary disease, unspecified: Secondary | ICD-10-CM | POA: Diagnosis not present

## 2021-04-08 DIAGNOSIS — I509 Heart failure, unspecified: Secondary | ICD-10-CM | POA: Diagnosis not present

## 2021-04-08 DIAGNOSIS — N189 Chronic kidney disease, unspecified: Secondary | ICD-10-CM | POA: Diagnosis not present

## 2021-04-08 DIAGNOSIS — N390 Urinary tract infection, site not specified: Secondary | ICD-10-CM | POA: Diagnosis not present

## 2021-04-08 DIAGNOSIS — I2510 Atherosclerotic heart disease of native coronary artery without angina pectoris: Secondary | ICD-10-CM | POA: Diagnosis not present

## 2021-04-08 DIAGNOSIS — E538 Deficiency of other specified B group vitamins: Secondary | ICD-10-CM | POA: Diagnosis not present

## 2021-04-08 DIAGNOSIS — K2100 Gastro-esophageal reflux disease with esophagitis, without bleeding: Secondary | ICD-10-CM | POA: Diagnosis not present

## 2021-04-08 DIAGNOSIS — E1122 Type 2 diabetes mellitus with diabetic chronic kidney disease: Secondary | ICD-10-CM | POA: Diagnosis not present

## 2021-04-08 DIAGNOSIS — D631 Anemia in chronic kidney disease: Secondary | ICD-10-CM | POA: Diagnosis not present

## 2021-04-14 DIAGNOSIS — J449 Chronic obstructive pulmonary disease, unspecified: Secondary | ICD-10-CM | POA: Diagnosis not present

## 2021-04-14 DIAGNOSIS — N189 Chronic kidney disease, unspecified: Secondary | ICD-10-CM | POA: Diagnosis not present

## 2021-04-14 DIAGNOSIS — I509 Heart failure, unspecified: Secondary | ICD-10-CM | POA: Diagnosis not present

## 2021-04-14 DIAGNOSIS — N390 Urinary tract infection, site not specified: Secondary | ICD-10-CM | POA: Diagnosis not present

## 2021-04-14 DIAGNOSIS — I2510 Atherosclerotic heart disease of native coronary artery without angina pectoris: Secondary | ICD-10-CM | POA: Diagnosis not present

## 2021-04-14 DIAGNOSIS — E538 Deficiency of other specified B group vitamins: Secondary | ICD-10-CM | POA: Diagnosis not present

## 2021-04-14 DIAGNOSIS — E1122 Type 2 diabetes mellitus with diabetic chronic kidney disease: Secondary | ICD-10-CM | POA: Diagnosis not present

## 2021-04-14 DIAGNOSIS — I130 Hypertensive heart and chronic kidney disease with heart failure and stage 1 through stage 4 chronic kidney disease, or unspecified chronic kidney disease: Secondary | ICD-10-CM | POA: Diagnosis not present

## 2021-04-14 DIAGNOSIS — K2100 Gastro-esophageal reflux disease with esophagitis, without bleeding: Secondary | ICD-10-CM | POA: Diagnosis not present

## 2021-04-14 DIAGNOSIS — D631 Anemia in chronic kidney disease: Secondary | ICD-10-CM | POA: Diagnosis not present

## 2021-04-15 DIAGNOSIS — J449 Chronic obstructive pulmonary disease, unspecified: Secondary | ICD-10-CM | POA: Diagnosis not present

## 2021-04-15 DIAGNOSIS — I130 Hypertensive heart and chronic kidney disease with heart failure and stage 1 through stage 4 chronic kidney disease, or unspecified chronic kidney disease: Secondary | ICD-10-CM | POA: Diagnosis not present

## 2021-04-15 DIAGNOSIS — I509 Heart failure, unspecified: Secondary | ICD-10-CM | POA: Diagnosis not present

## 2021-04-15 DIAGNOSIS — E538 Deficiency of other specified B group vitamins: Secondary | ICD-10-CM | POA: Diagnosis not present

## 2021-04-15 DIAGNOSIS — I2510 Atherosclerotic heart disease of native coronary artery without angina pectoris: Secondary | ICD-10-CM | POA: Diagnosis not present

## 2021-04-15 DIAGNOSIS — N189 Chronic kidney disease, unspecified: Secondary | ICD-10-CM | POA: Diagnosis not present

## 2021-04-15 DIAGNOSIS — N390 Urinary tract infection, site not specified: Secondary | ICD-10-CM | POA: Diagnosis not present

## 2021-04-15 DIAGNOSIS — E1122 Type 2 diabetes mellitus with diabetic chronic kidney disease: Secondary | ICD-10-CM | POA: Diagnosis not present

## 2021-04-15 DIAGNOSIS — K2100 Gastro-esophageal reflux disease with esophagitis, without bleeding: Secondary | ICD-10-CM | POA: Diagnosis not present

## 2021-04-15 DIAGNOSIS — D631 Anemia in chronic kidney disease: Secondary | ICD-10-CM | POA: Diagnosis not present

## 2021-04-20 DIAGNOSIS — E559 Vitamin D deficiency, unspecified: Secondary | ICD-10-CM | POA: Diagnosis not present

## 2021-04-20 DIAGNOSIS — I509 Heart failure, unspecified: Secondary | ICD-10-CM | POA: Diagnosis not present

## 2021-04-20 DIAGNOSIS — E063 Autoimmune thyroiditis: Secondary | ICD-10-CM | POA: Diagnosis not present

## 2021-04-20 DIAGNOSIS — Z6827 Body mass index (BMI) 27.0-27.9, adult: Secondary | ICD-10-CM | POA: Diagnosis not present

## 2021-04-20 DIAGNOSIS — K2100 Gastro-esophageal reflux disease with esophagitis, without bleeding: Secondary | ICD-10-CM | POA: Diagnosis not present

## 2021-04-20 DIAGNOSIS — N189 Chronic kidney disease, unspecified: Secondary | ICD-10-CM | POA: Diagnosis not present

## 2021-04-20 DIAGNOSIS — D649 Anemia, unspecified: Secondary | ICD-10-CM | POA: Diagnosis not present

## 2021-04-20 DIAGNOSIS — E118 Type 2 diabetes mellitus with unspecified complications: Secondary | ICD-10-CM | POA: Diagnosis not present

## 2021-04-20 DIAGNOSIS — I2510 Atherosclerotic heart disease of native coronary artery without angina pectoris: Secondary | ICD-10-CM | POA: Diagnosis not present

## 2021-04-20 DIAGNOSIS — J449 Chronic obstructive pulmonary disease, unspecified: Secondary | ICD-10-CM | POA: Diagnosis not present

## 2021-05-06 DIAGNOSIS — N184 Chronic kidney disease, stage 4 (severe): Secondary | ICD-10-CM | POA: Diagnosis not present

## 2021-05-06 DIAGNOSIS — N3946 Mixed incontinence: Secondary | ICD-10-CM | POA: Diagnosis not present

## 2021-05-06 DIAGNOSIS — N760 Acute vaginitis: Secondary | ICD-10-CM | POA: Diagnosis not present

## 2021-05-06 DIAGNOSIS — N319 Neuromuscular dysfunction of bladder, unspecified: Secondary | ICD-10-CM | POA: Diagnosis not present

## 2021-05-06 DIAGNOSIS — N816 Rectocele: Secondary | ICD-10-CM | POA: Diagnosis not present

## 2021-05-06 DIAGNOSIS — B9689 Other specified bacterial agents as the cause of diseases classified elsewhere: Secondary | ICD-10-CM | POA: Diagnosis not present

## 2021-05-06 DIAGNOSIS — I69398 Other sequelae of cerebral infarction: Secondary | ICD-10-CM | POA: Diagnosis not present

## 2021-05-06 DIAGNOSIS — R339 Retention of urine, unspecified: Secondary | ICD-10-CM | POA: Diagnosis not present

## 2021-05-06 DIAGNOSIS — R310 Gross hematuria: Secondary | ICD-10-CM | POA: Diagnosis not present

## 2021-05-06 DIAGNOSIS — N390 Urinary tract infection, site not specified: Secondary | ICD-10-CM | POA: Diagnosis not present

## 2021-06-03 DIAGNOSIS — N189 Chronic kidney disease, unspecified: Secondary | ICD-10-CM | POA: Diagnosis not present

## 2021-06-03 DIAGNOSIS — E118 Type 2 diabetes mellitus with unspecified complications: Secondary | ICD-10-CM | POA: Diagnosis not present

## 2021-06-03 DIAGNOSIS — E559 Vitamin D deficiency, unspecified: Secondary | ICD-10-CM | POA: Diagnosis not present

## 2021-06-03 DIAGNOSIS — J449 Chronic obstructive pulmonary disease, unspecified: Secondary | ICD-10-CM | POA: Diagnosis not present

## 2021-06-03 DIAGNOSIS — Z6826 Body mass index (BMI) 26.0-26.9, adult: Secondary | ICD-10-CM | POA: Diagnosis not present

## 2021-06-03 DIAGNOSIS — D649 Anemia, unspecified: Secondary | ICD-10-CM | POA: Diagnosis not present

## 2021-06-03 DIAGNOSIS — E538 Deficiency of other specified B group vitamins: Secondary | ICD-10-CM | POA: Diagnosis not present

## 2021-06-03 DIAGNOSIS — I10 Essential (primary) hypertension: Secondary | ICD-10-CM | POA: Diagnosis not present

## 2021-06-03 DIAGNOSIS — K2100 Gastro-esophageal reflux disease with esophagitis, without bleeding: Secondary | ICD-10-CM | POA: Diagnosis not present

## 2021-06-03 DIAGNOSIS — Z79899 Other long term (current) drug therapy: Secondary | ICD-10-CM | POA: Diagnosis not present

## 2021-06-03 DIAGNOSIS — E063 Autoimmune thyroiditis: Secondary | ICD-10-CM | POA: Diagnosis not present

## 2021-06-03 DIAGNOSIS — E785 Hyperlipidemia, unspecified: Secondary | ICD-10-CM | POA: Diagnosis not present

## 2021-06-20 ENCOUNTER — Emergency Department (HOSPITAL_COMMUNITY): Payer: Medicare HMO

## 2021-06-20 ENCOUNTER — Inpatient Hospital Stay (HOSPITAL_COMMUNITY)
Admission: EM | Admit: 2021-06-20 | Discharge: 2021-06-30 | DRG: 637 | Disposition: A | Discharge status: Skilled Nursing Facility | Payer: Medicare HMO | Attending: Internal Medicine | Admitting: Internal Medicine

## 2021-06-20 ENCOUNTER — Encounter (HOSPITAL_COMMUNITY): Payer: Self-pay | Admitting: Emergency Medicine

## 2021-06-20 DIAGNOSIS — Z87891 Personal history of nicotine dependence: Secondary | ICD-10-CM

## 2021-06-20 DIAGNOSIS — Z79899 Other long term (current) drug therapy: Secondary | ICD-10-CM

## 2021-06-20 DIAGNOSIS — Z8249 Family history of ischemic heart disease and other diseases of the circulatory system: Secondary | ICD-10-CM

## 2021-06-20 DIAGNOSIS — E440 Moderate protein-calorie malnutrition: Secondary | ICD-10-CM | POA: Insufficient documentation

## 2021-06-20 DIAGNOSIS — N390 Urinary tract infection, site not specified: Secondary | ICD-10-CM | POA: Diagnosis present

## 2021-06-20 DIAGNOSIS — K219 Gastro-esophageal reflux disease without esophagitis: Secondary | ICD-10-CM | POA: Diagnosis present

## 2021-06-20 DIAGNOSIS — E11649 Type 2 diabetes mellitus with hypoglycemia without coma: Secondary | ICD-10-CM | POA: Diagnosis not present

## 2021-06-20 DIAGNOSIS — Z951 Presence of aortocoronary bypass graft: Secondary | ICD-10-CM

## 2021-06-20 DIAGNOSIS — Z9114 Patient's other noncompliance with medication regimen: Secondary | ICD-10-CM

## 2021-06-20 DIAGNOSIS — E782 Mixed hyperlipidemia: Secondary | ICD-10-CM | POA: Diagnosis present

## 2021-06-20 DIAGNOSIS — E039 Hypothyroidism, unspecified: Secondary | ICD-10-CM | POA: Diagnosis present

## 2021-06-20 DIAGNOSIS — I10 Essential (primary) hypertension: Secondary | ICD-10-CM | POA: Diagnosis present

## 2021-06-20 DIAGNOSIS — Z9111 Patient's noncompliance with dietary regimen: Secondary | ICD-10-CM

## 2021-06-20 DIAGNOSIS — S0990XA Unspecified injury of head, initial encounter: Secondary | ICD-10-CM | POA: Diagnosis not present

## 2021-06-20 DIAGNOSIS — I6381 Other cerebral infarction due to occlusion or stenosis of small artery: Secondary | ICD-10-CM | POA: Diagnosis not present

## 2021-06-20 DIAGNOSIS — Z7951 Long term (current) use of inhaled steroids: Secondary | ICD-10-CM

## 2021-06-20 DIAGNOSIS — D631 Anemia in chronic kidney disease: Secondary | ICD-10-CM | POA: Diagnosis present

## 2021-06-20 DIAGNOSIS — G9340 Encephalopathy, unspecified: Secondary | ICD-10-CM

## 2021-06-20 DIAGNOSIS — N184 Chronic kidney disease, stage 4 (severe): Secondary | ICD-10-CM | POA: Diagnosis present

## 2021-06-20 DIAGNOSIS — E1301 Other specified diabetes mellitus with hyperosmolarity with coma: Secondary | ICD-10-CM | POA: Diagnosis not present

## 2021-06-20 DIAGNOSIS — R739 Hyperglycemia, unspecified: Secondary | ICD-10-CM | POA: Diagnosis not present

## 2021-06-20 DIAGNOSIS — S40022A Contusion of left upper arm, initial encounter: Secondary | ICD-10-CM | POA: Diagnosis not present

## 2021-06-20 DIAGNOSIS — R0902 Hypoxemia: Secondary | ICD-10-CM | POA: Diagnosis not present

## 2021-06-20 DIAGNOSIS — M2578 Osteophyte, vertebrae: Secondary | ICD-10-CM | POA: Diagnosis not present

## 2021-06-20 DIAGNOSIS — R410 Disorientation, unspecified: Secondary | ICD-10-CM | POA: Diagnosis present

## 2021-06-20 DIAGNOSIS — Z20822 Contact with and (suspected) exposure to covid-19: Secondary | ICD-10-CM | POA: Diagnosis present

## 2021-06-20 DIAGNOSIS — I6529 Occlusion and stenosis of unspecified carotid artery: Secondary | ICD-10-CM | POA: Diagnosis not present

## 2021-06-20 DIAGNOSIS — I252 Old myocardial infarction: Secondary | ICD-10-CM

## 2021-06-20 DIAGNOSIS — Z794 Long term (current) use of insulin: Secondary | ICD-10-CM

## 2021-06-20 DIAGNOSIS — D62 Acute posthemorrhagic anemia: Secondary | ICD-10-CM | POA: Diagnosis not present

## 2021-06-20 DIAGNOSIS — Z7984 Long term (current) use of oral hypoglycemic drugs: Secondary | ICD-10-CM

## 2021-06-20 DIAGNOSIS — I129 Hypertensive chronic kidney disease with stage 1 through stage 4 chronic kidney disease, or unspecified chronic kidney disease: Secondary | ICD-10-CM | POA: Diagnosis present

## 2021-06-20 DIAGNOSIS — E1100 Type 2 diabetes mellitus with hyperosmolarity without nonketotic hyperglycemic-hyperosmolar coma (NKHHC): Secondary | ICD-10-CM | POA: Diagnosis not present

## 2021-06-20 DIAGNOSIS — R102 Pelvic and perineal pain: Secondary | ICD-10-CM | POA: Diagnosis not present

## 2021-06-20 DIAGNOSIS — G9341 Metabolic encephalopathy: Secondary | ICD-10-CM | POA: Diagnosis present

## 2021-06-20 DIAGNOSIS — Z90710 Acquired absence of both cervix and uterus: Secondary | ICD-10-CM

## 2021-06-20 DIAGNOSIS — M79603 Pain in arm, unspecified: Secondary | ICD-10-CM | POA: Diagnosis not present

## 2021-06-20 DIAGNOSIS — Z043 Encounter for examination and observation following other accident: Secondary | ICD-10-CM | POA: Diagnosis not present

## 2021-06-20 DIAGNOSIS — Z66 Do not resuscitate: Secondary | ICD-10-CM | POA: Diagnosis present

## 2021-06-20 DIAGNOSIS — E1122 Type 2 diabetes mellitus with diabetic chronic kidney disease: Secondary | ICD-10-CM | POA: Diagnosis present

## 2021-06-20 DIAGNOSIS — J449 Chronic obstructive pulmonary disease, unspecified: Secondary | ICD-10-CM | POA: Diagnosis present

## 2021-06-20 DIAGNOSIS — I2510 Atherosclerotic heart disease of native coronary artery without angina pectoris: Secondary | ICD-10-CM | POA: Diagnosis present

## 2021-06-20 DIAGNOSIS — Z833 Family history of diabetes mellitus: Secondary | ICD-10-CM

## 2021-06-20 DIAGNOSIS — E1165 Type 2 diabetes mellitus with hyperglycemia: Secondary | ICD-10-CM | POA: Diagnosis not present

## 2021-06-20 DIAGNOSIS — Z7902 Long term (current) use of antithrombotics/antiplatelets: Secondary | ICD-10-CM

## 2021-06-20 DIAGNOSIS — S199XXA Unspecified injury of neck, initial encounter: Secondary | ICD-10-CM | POA: Diagnosis not present

## 2021-06-20 DIAGNOSIS — Z7982 Long term (current) use of aspirin: Secondary | ICD-10-CM

## 2021-06-20 DIAGNOSIS — R296 Repeated falls: Secondary | ICD-10-CM | POA: Diagnosis present

## 2021-06-20 DIAGNOSIS — R52 Pain, unspecified: Secondary | ICD-10-CM | POA: Diagnosis not present

## 2021-06-20 DIAGNOSIS — E1151 Type 2 diabetes mellitus with diabetic peripheral angiopathy without gangrene: Secondary | ICD-10-CM | POA: Diagnosis present

## 2021-06-20 DIAGNOSIS — Z888 Allergy status to other drugs, medicaments and biological substances status: Secondary | ICD-10-CM

## 2021-06-20 DIAGNOSIS — Z79890 Hormone replacement therapy (postmenopausal): Secondary | ICD-10-CM

## 2021-06-20 DIAGNOSIS — Z743 Need for continuous supervision: Secondary | ICD-10-CM | POA: Diagnosis not present

## 2021-06-20 DIAGNOSIS — G9389 Other specified disorders of brain: Secondary | ICD-10-CM | POA: Diagnosis not present

## 2021-06-20 HISTORY — DX: Type 2 diabetes mellitus with hyperosmolarity without nonketotic hyperglycemic-hyperosmolar coma (NKHHC): E11.00

## 2021-06-20 HISTORY — DX: Disorientation, unspecified: R41.0

## 2021-06-20 LAB — URINALYSIS, ROUTINE W REFLEX MICROSCOPIC
Bilirubin Urine: NEGATIVE
Glucose, UA: >=500 mg/dL — AB
Ketones, ur: 5 mg/dL — AB
Nitrite: NEGATIVE
Protein, ur: NEGATIVE mg/dL
Specific Gravity, Urine: 1.02 (ref 1.005–1.030)
pH: 6 (ref 5.0–8.0)

## 2021-06-20 LAB — BASIC METABOLIC PANEL
Anion gap: 12 (ref 5–15)
Anion gap: 10 (ref 5–15)
Anion gap: 9 (ref 5–15)
BUN: 37 mg/dL — ABNORMAL HIGH (ref 8–23)
BUN: 36 mg/dL — ABNORMAL HIGH (ref 8–23)
BUN: 32 mg/dL — ABNORMAL HIGH (ref 8–23)
CO2: 19 mmol/L — ABNORMAL LOW (ref 22–32)
CO2: 17 mmol/L — ABNORMAL LOW (ref 22–32)
CO2: 18 mmol/L — ABNORMAL LOW (ref 22–32)
Calcium: 9.5 mg/dL (ref 8.9–10.3)
Calcium: 8.9 mg/dL (ref 8.9–10.3)
Calcium: 9.3 mg/dL (ref 8.9–10.3)
Chloride: 91 mmol/L — ABNORMAL LOW (ref 98–111)
Chloride: 99 mmol/L (ref 98–111)
Chloride: 106 mmol/L (ref 98–111)
Creatinine, Ser: 2.34 mg/dL — ABNORMAL HIGH (ref 0.44–1.00)
Creatinine, Ser: 2.05 mg/dL — ABNORMAL HIGH (ref 0.44–1.00)
Creatinine, Ser: 1.95 mg/dL — ABNORMAL HIGH (ref 0.44–1.00)
GFR, Estimated: 21 mL/min — ABNORMAL LOW (ref >60)
GFR, Estimated: 25 mL/min — ABNORMAL LOW (ref >60)
GFR, Estimated: 27 mL/min — ABNORMAL LOW (ref >60)
Glucose, Bld: 914 mg/dL (ref 70–99)
Glucose, Bld: 776 mg/dL (ref 70–99)
Glucose, Bld: 368 mg/dL — ABNORMAL HIGH (ref 70–99)
Potassium: 4 mmol/L (ref 3.5–5.1)
Potassium: 3.9 mmol/L (ref 3.5–5.1)
Potassium: 3.5 mmol/L (ref 3.5–5.1)
Sodium: 122 mmol/L — ABNORMAL LOW (ref 135–145)
Sodium: 126 mmol/L — ABNORMAL LOW (ref 135–145)
Sodium: 133 mmol/L — ABNORMAL LOW (ref 135–145)

## 2021-06-20 LAB — CBG MONITORING, ED
Glucose-Capillary: >600 mg/dL (ref 70–99)
Glucose-Capillary: >600 mg/dL (ref 70–99)
Glucose-Capillary: 584 mg/dL (ref 70–99)
Glucose-Capillary: 587 mg/dL (ref 70–99)
Glucose-Capillary: 511 mg/dL (ref 70–99)
Glucose-Capillary: 465 mg/dL — ABNORMAL HIGH (ref 70–99)
Glucose-Capillary: 321 mg/dL — ABNORMAL HIGH (ref 70–99)
Glucose-Capillary: 261 mg/dL — ABNORMAL HIGH (ref 70–99)

## 2021-06-20 LAB — HEPATIC FUNCTION PANEL
ALT: 24 U/L (ref 0–44)
AST: 13 U/L — ABNORMAL LOW (ref 15–41)
Albumin: 3.2 g/dL — ABNORMAL LOW (ref 3.5–5.0)
Alkaline Phosphatase: 169 U/L — ABNORMAL HIGH (ref 38–126)
Bilirubin, Direct: <0.1 mg/dL (ref 0.0–0.2)
Total Bilirubin: 0.8 mg/dL (ref 0.3–1.2)
Total Protein: 6.1 g/dL — ABNORMAL LOW (ref 6.5–8.1)

## 2021-06-20 LAB — CBC
HCT: 30.2 % — ABNORMAL LOW (ref 36.0–46.0)
HCT: 29.2 % — ABNORMAL LOW (ref 36.0–46.0)
Hemoglobin: 9.8 g/dL — ABNORMAL LOW (ref 12.0–15.0)
Hemoglobin: 9.5 g/dL — ABNORMAL LOW (ref 12.0–15.0)
MCH: 28.3 pg (ref 26.0–34.0)
MCH: 28.7 pg (ref 26.0–34.0)
MCHC: 32.5 g/dL (ref 30.0–36.0)
MCHC: 32.5 g/dL (ref 30.0–36.0)
MCV: 87.3 fL (ref 80.0–100.0)
MCV: 88.2 fL (ref 80.0–100.0)
Platelets: 185 10*3/uL (ref 150–400)
Platelets: 179 10*3/uL (ref 150–400)
RBC: 3.46 MIL/uL — ABNORMAL LOW (ref 3.87–5.11)
RBC: 3.31 MIL/uL — ABNORMAL LOW (ref 3.87–5.11)
RDW: 13.1 % (ref 11.5–15.5)
RDW: 12.9 % (ref 11.5–15.5)
WBC: 9 10*3/uL (ref 4.0–10.5)
WBC: 9.3 10*3/uL (ref 4.0–10.5)
nRBC: 0 % (ref 0.0–0.2)
nRBC: 0 % (ref 0.0–0.2)

## 2021-06-20 LAB — RESP PANEL BY RT-PCR (FLU A&B, COVID) ARPGX2
Influenza A by PCR: NEGATIVE
Influenza B by PCR: NEGATIVE
SARS Coronavirus 2 by RT PCR: NEGATIVE

## 2021-06-20 LAB — I-STAT ARTERIAL BLOOD GAS, ED
Acid-base deficit: 7 mmol/L — ABNORMAL HIGH (ref 0.0–2.0)
Bicarbonate: 17.9 mmol/L — ABNORMAL LOW (ref 20.0–28.0)
Calcium, Ion: 1.29 mmol/L (ref 1.15–1.40)
HCT: 26 % — ABNORMAL LOW (ref 36.0–46.0)
Hemoglobin: 8.8 g/dL — ABNORMAL LOW (ref 12.0–15.0)
O2 Saturation: 98 %
Patient temperature: 78.5
Potassium: 3.8 mmol/L (ref 3.5–5.1)
Sodium: 128 mmol/L — ABNORMAL LOW (ref 135–145)
TCO2: 19 mmol/L — ABNORMAL LOW (ref 22–32)
pCO2 arterial: 20.4 mmHg — ABNORMAL LOW (ref 32.0–48.0)
pH, Arterial: 7.497 — ABNORMAL HIGH (ref 7.350–7.450)
pO2, Arterial: 55 mmHg — ABNORMAL LOW (ref 83.0–108.0)

## 2021-06-20 LAB — T4, FREE: Free T4: 1.03 ng/dL (ref 0.61–1.12)

## 2021-06-20 LAB — TROPONIN I (HIGH SENSITIVITY): Troponin I (High Sensitivity): 14 ng/L (ref <18)

## 2021-06-20 LAB — TSH: TSH: 3.881 u[IU]/mL (ref 0.350–4.500)

## 2021-06-20 LAB — OSMOLALITY
Osmolality: 319 mOsm/kg — ABNORMAL HIGH (ref 275–295)
Osmolality: 309 mOsm/kg — ABNORMAL HIGH (ref 275–295)

## 2021-06-20 LAB — MAGNESIUM: Magnesium: 2.3 mg/dL (ref 1.7–2.4)

## 2021-06-20 MED ORDER — CEFTRIAXONE SODIUM 1 G IJ SOLR
1.0000 g | INTRAMUSCULAR | Status: DC
  Administered 2021-06-21 – 2021-06-23 (×3): 1 g via INTRAVENOUS
  Filled 2021-06-20: qty 10
  Filled 2021-06-20: qty 1
  Filled 2021-06-20 (×2): qty 10

## 2021-06-20 MED ORDER — DEXTROSE IN LACTATED RINGERS 5 % IV SOLN: INTRAVENOUS | Status: DC

## 2021-06-20 MED ORDER — ROSUVASTATIN CALCIUM 20 MG PO TABS: 20.0000 mg | ORAL_TABLET | Freq: Every day | ORAL | Status: DC

## 2021-06-20 MED ORDER — LACTATED RINGERS IV SOLN: INTRAVENOUS | Status: DC

## 2021-06-20 MED ORDER — METOPROLOL SUCCINATE ER 100 MG PO TB24: 100.0000 mg | ORAL_TABLET | Freq: Every day | ORAL | Status: DC

## 2021-06-20 MED ORDER — INSULIN REGULAR(HUMAN) IN NACL 100-0.9 UT/100ML-% IV SOLN
INTRAVENOUS | Status: DC
  Administered 2021-06-20: 3.8 [IU]/h via INTRAVENOUS
  Filled 2021-06-20: qty 100

## 2021-06-20 MED ORDER — ZIPRASIDONE MESYLATE 20 MG IM SOLR
10.0000 mg | Freq: Once | INTRAMUSCULAR | Status: AC
  Administered 2021-06-20: 10 mg via INTRAMUSCULAR
  Filled 2021-06-20: qty 20

## 2021-06-20 MED ORDER — LEVOTHYROXINE SODIUM 100 MCG/5ML IV SOLN
25.0000 ug | Freq: Every day | INTRAVENOUS | Status: DC
  Administered 2021-06-21 – 2021-06-24 (×5): 25 ug via INTRAVENOUS
  Filled 2021-06-20 (×5): qty 5

## 2021-06-20 MED ORDER — INSULIN REGULAR(HUMAN) IN NACL 100-0.9 UT/100ML-% IV SOLN
INTRAVENOUS | Status: DC
  Administered 2021-06-20: 3.8 [IU]/h via INTRAVENOUS

## 2021-06-20 MED ORDER — SODIUM CHLORIDE 0.9 % IV BOLUS
1000.0000 mL | Freq: Once | INTRAVENOUS | Status: AC
  Administered 2021-06-20: 1000 mL via INTRAVENOUS

## 2021-06-20 MED ORDER — TAMSULOSIN HCL 0.4 MG PO CAPS: 0.4000 mg | ORAL_CAPSULE | Freq: Every day | ORAL | Status: DC

## 2021-06-20 MED ORDER — EZETIMIBE 10 MG PO TABS: 10.0000 mg | ORAL_TABLET | Freq: Every day | ORAL | Status: DC

## 2021-06-20 MED ORDER — DEXTROSE 50 % IV SOLN
0.0000 mL | INTRAVENOUS | Status: DC | PRN
  Filled 2021-06-20: qty 50

## 2021-06-20 MED ORDER — ISOSORBIDE MONONITRATE ER 60 MG PO TB24: 120.0000 mg | ORAL_TABLET | Freq: Every day | ORAL | Status: DC

## 2021-06-20 MED ORDER — DEXTROSE 50 % IV SOLN: 0.0000 mL | INTRAVENOUS | Status: DC | PRN

## 2021-06-20 MED ORDER — POTASSIUM CHLORIDE 10 MEQ/100ML IV SOLN
10.0000 meq | INTRAVENOUS | Status: AC
  Administered 2021-06-20: 10 meq via INTRAVENOUS
  Filled 2021-06-20 (×2): qty 100

## 2021-06-20 MED ORDER — LABETALOL HCL 5 MG/ML IV SOLN: 10.0000 mg | INTRAVENOUS | Status: DC | PRN

## 2021-06-20 MED ORDER — TICAGRELOR 90 MG PO TABS: 90.0000 mg | ORAL_TABLET | Freq: Two times a day (BID) | ORAL | Status: DC

## 2021-06-20 MED ORDER — SODIUM CHLORIDE 0.9 % IV SOLN
1.0000 g | Freq: Once | INTRAVENOUS | Status: AC
  Administered 2021-06-20: 1 g via INTRAVENOUS
  Filled 2021-06-20: qty 10

## 2021-06-20 MED ORDER — ENOXAPARIN SODIUM 30 MG/0.3ML IJ SOSY
30.0000 mg | PREFILLED_SYRINGE | INTRAMUSCULAR | Status: DC
  Administered 2021-06-21 – 2021-06-29 (×10): 30 mg via SUBCUTANEOUS
  Filled 2021-06-20 (×10): qty 0.3

## 2021-06-20 MED ORDER — LACTATED RINGERS IV BOLUS
20.0000 mL/kg | Freq: Once | INTRAVENOUS | Status: AC
  Administered 2021-06-20: 1324 mL via INTRAVENOUS

## 2021-06-20 MED ORDER — POTASSIUM CHLORIDE 10 MEQ/100ML IV SOLN
10.0000 meq | Freq: Once | INTRAVENOUS | Status: AC
  Administered 2021-06-20: 10 meq via INTRAVENOUS

## 2021-06-20 NOTE — H&P (Signed)
History and Physical    Tonya Richard GDJ:[id number] DOB: 01-Aug-1946 DOA: 06/20/2021  PCP: Penelope Coop, FNP  Patient coming from: Home.  History obtained from patient's daughter.  Patient is still confused.  Chief Complaint: Increasing confusion and elevated blood sugar.  HPI: Tonya Richard is a 75 y.o. female with history of diabetes mellitus type 2, CAD status post CABG, peripheral vascular disease, chronic kidney disease stage IV, chronic anemia was brought to the ER after patient's daughter found that patient was getting increasingly confused over the last 3 days has been having frequent falls and blood sugar has been running high.  Patient daughter states that she was admitted a similar situation in March of this year at [facility] and patient had very high blood sugar eventually was discharged to rehab and after discharge patient's daughter has been living with her.  Initially she was very compliant with the medication but last few weeks he has been very resistant to taking medications.  Hemoglobin A1c last 1 was around 14 2 weeks ago as per the patient's daughter.  Last 3 days patient has been getting more confused having falls but did not have any chest pain or shortness of breath nausea vomiting diarrhea fever or chills.  This morning patient did get her Basaglar insulin which was administered by patient's daughter.  But last few days she has not taken any of her medications.  Since she was getting more confused EMS was called and patient was brought to the ER.  ED Course: In the ER patient blood sugars were 776 with anion gap of 10 ABG showed a pH of 7.49 creatinine was 2.05 bicarb of 17 hemoglobin of 8.  Patient was started on insulin infusion IV fluids for hyperosmolar status.  CT head C-spine unremarkable x-rays do not show any fractures.  COVID test was negative.  UA was concerning for UTI was started on ceftriaxone but admitted for further management hyperosmolar status  with acute encephalopathy/acute delirium.  Patient became agitated was given 1 dose of Geodon.  Review of Systems: As per HPI, rest all negative.   Past Medical History:  Diagnosis Date   Abnormal stress test 08/09/2017   Formatting of this note might be different from the original. Added automatically from request for surgery [id number]   Acute renal failure ([place]) 08/16/2014   Asthma    Asthma, chronic 08/16/2014   Benign essential hypertension 08/09/2017   CAD (coronary artery disease), native coronary artery    h/o CABG, stent   Chest pain 08/09/2017   Chronic coronary artery disease 04/16/2019   Formatting of this note might be different from the original. 3-Vessel   COPD (chronic obstructive pulmonary disease) ([place]) 10/13/2018   DM type 2 (diabetes mellitus, type 2) ([place])    Enteritis due to Clostridium difficile 08/17/2014   Essential hypertension, benign 08/16/2014   GERD (gastroesophageal reflux disease)    Hx of CABG 08/09/2017   Hyperkalemia, mild 08/16/2014   Hyperlipidemia    Hypertension    Hypoglycemia 08/16/2014   Hypothyroidism    Late effect of cerebrovascular accident (CVA) 03/18/2020   Long-term use of aspirin therapy 9/89/2119   Metabolic acidosis 03/15/4080   Mixed hyperlipidemia 08/09/2017   Morbid obesity ([place]) 08/16/2014   Myocardial infarction [facility])    PAD (peripheral artery disease) ([place]) 08/09/2017   Peripheral vascular disease ([place]) 05/12/2020   Pre-op evaluation 06/29/2016   Shortness of breath    Strep pharyngitis 08/16/2014   Type 2 diabetes  mellitus with circulatory disorder, with long-term current use of insulin ([place]) 09/06/2017   Vomiting and diarrhea 08/16/2014    Past Surgical History:  Procedure Laterality Date   CORONARY ARTERY BYPASS GRAFT     VAGINAL HYSTERECTOMY     WRIST SURGERY       reports that she has quit smoking. Her smokeless tobacco use includes snuff. She reports that she does not drink alcohol and does not use drugs.  Allergies  Allergen  Reactions   Atorvastatin     Other reaction(s): Other (See Comments) Muscle weakness and pain in legs    Family History  Problem Relation Age of Onset   Diabetes Father    Heart disease Father    Heart disease Brother     Prior to Admission medications   Medication Sig Start Date End Date Taking? Authorizing Provider  Alirocumab (PRALUENT) 150 MG/ML SOAJ Inject 1 Dose into the skin every 14 (fourteen) days. 04/15/20   Hilty, Nadean Corwin, MD  ALPRAZolam Duanne Moron) 0.5 MG tablet Take 0.5 mg by mouth at bedtime.     [provider]  Ascorbic Acid (VITAMIN C PO) Take by mouth daily.    [provider]  aspirin EC 325 MG tablet Take by mouth daily.     [provider]  BRILINTA 90 MG TABS tablet Take 1 tablet (90 mg total) by mouth 2 (two) times daily. 07/17/20   Park Liter, MD  budesonide-formoterol [facility]) 160-4.5 MCG/ACT inhaler Inhale 2 puffs into the lungs 2 (two) times daily.    [provider]  clopidogrel (PLAVIX) 75 MG tablet Take 1 tablet by mouth daily. 07/22/16   [provider]  Cyanocobalamin (VITAMIN B 12 PO) Take by mouth daily.    [provider]  Dulaglutide (TRULICITY) 1.5 WU/1.3KG SOPN Inject 1.5 mg into the skin once a week. 04/21/16   [provider]  ferrous sulfate 325 (65 FE) MG tablet Take 325 mg by mouth daily with breakfast.    [provider]  furosemide (LASIX) 40 MG tablet Take 0.5 tablets by mouth daily as needed. 12/12/20   [provider]  Insulin Glargine (BASAGLAR KWIKPEN) 100 UNIT/ML Inject 45 Units into the skin daily.     [provider]  ipratropium-albuterol (DUONEB) 0.5-2.5 (3) MG/3ML SOLN Take 3 mLs by nebulization 4 (four) times daily as needed (shortness of breath/wheezing).    [provider]  isosorbide mononitrate (IMDUR) 120 MG 24 hr tablet TAKE 1 TABLET(120 MG) BY MOUTH DAILY 04/07/21   Park Liter, MD  ketoconazole (NIZORAL) 2 % cream  Apply topically daily as needed. 08/25/20   [provider]  levothyroxine (SYNTHROID, LEVOTHROID) 50 MCG tablet Take 50 mcg by mouth daily before breakfast.    [provider]  Melatonin 10 MG TABS Take by mouth at bedtime.    [provider]  metFORMIN (GLUCOPHAGE) 1000 MG tablet Take 1,000 mg by mouth 2 (two) times daily. 01/22/20   [provider]  metoprolol succinate (TOPROL-XL) 100 MG 24 hr tablet Take by mouth daily. Take with or immediately following a meal.     [provider]  nitroGLYCERIN (NITROSTAT) 0.4 MG SL tablet Place under the tongue. 03/21/18   [provider]  NOVOLOG FLEXPEN 100 UNIT/ML FlexPen INJECT AS DIRECTED PER SLIDING SCALE AND BLOOD SUGAR READINGS. MAX DAILY DOSE IS 50 UNITS 03/12/20   [provider]  oxybutynin (DITROPAN) 5 MG tablet Take 5 mg by mouth 2 (two) times  daily.  11/16/19   [provider]  pantoprazole (PROTONIX) 40 MG tablet Take 40 mg by mouth daily.    [provider]  rosuvastatin (CRESTOR) 20 MG tablet Take 1 tablet (20 mg total) by mouth daily. 04/07/21   Hilty, Nadean Corwin, MD  tiZANidine (ZANAFLEX) 4 MG tablet Take 4 mg by mouth at bedtime as needed. 02/20/20   [provider]  traMADol (ULTRAM) 50 MG tablet Take by mouth every 6 (six) hours as needed.    [provider]  Vitamin D, Ergocalciferol, (DRISDOL) 1.25 MG (50000 UNIT) CAPS capsule Take 50,000 Units by mouth once a week. 02/19/20   [provider]    Physical Exam: Constitutional: Moderately built and nourished. Vitals:   06/20/21 1600 06/20/21 1615 06/20/21 1745 06/20/21 1900  BP: 126/85 (!) 154/90 (!) 154/84 101/64  Pulse: 93 79 91 82  Resp: 18 (!) 24 15 19   Temp:      TempSrc:      SpO2: 99% 100% 100% 99%  Weight:      Height:       Eyes: Anicteric no pallor. ENMT: No discharge from the ears eyes nose and mouth. Neck: No mass felt.  No neck rigidity. Respiratory: No rhonchi  or crepitations. Cardiovascular: S1-S2 heard. Abdomen: Soft nontender bowel sound present. Musculoskeletal: No edema. Skin: No rash. Neurologic: Patient is confused moving all extremities.  Pupils are equal and reacting to light. Psychiatric: Patient is confused.   Labs on Admission: I have personally reviewed following labs and imaging studies  CBC: Recent Labs  Lab 06/20/21 1504 06/20/21 1702  WBC 9.0  --   HGB 9.8* 8.8*  HCT 30.2* 26.0*  MCV 87.3  --   PLT 185  --    Basic Metabolic Panel: Recent Labs  Lab 06/20/21 1504 06/20/21 1650 06/20/21 1702  NA 122* 126* 128*  K 4.0 3.9 3.8  CL 91* 99  --   CO2 19* 17*  --   GLUCOSE 914* 776*  --   BUN 37* 36*  --   CREATININE 2.34* 2.05*  --   CALCIUM 9.5 8.9  --   MG 2.3  --   --    GFR: Estimated Creatinine Clearance: 21.3 mL/min (A) (by C-G formula based on SCr of 2.05 mg/dL (H)). Liver Function Tests: Recent Labs  Lab 06/20/21 1504  AST 13*  ALT 24  ALKPHOS 169*  BILITOT 0.8  PROT 6.1*  ALBUMIN 3.2*   No results for input(s): LIPASE, AMYLASE in the last 168 hours. No results for input(s): AMMONIA in the last 168 hours. Coagulation Profile: No results for input(s): INR, PROTIME in the last 168 hours. Cardiac Enzymes: No results for input(s): CKTOTAL, CKMB, CKMBINDEX, TROPONINI in the last 168 hours. BNP (last 3 results) No results for input(s): PROBNP in the last 8760 hours. HbA1C: No results for input(s): HGBA1C in the last 72 hours. CBG: Recent Labs  Lab 06/20/21 1758 06/20/21 1833 06/20/21 1904 06/20/21 1936 06/20/21 2035  GLUCAP >600* 587* 584* 511* 465*   Lipid Profile: No results for input(s): CHOL, HDL, LDLCALC, TRIG, CHOLHDL, LDLDIRECT in the last 72 hours. Thyroid Function Tests: No results for input(s): TSH, T4TOTAL, FREET4, T3FREE, THYROIDAB in the last 72 hours. Anemia Panel: No results for input(s): VITAMINB12, FOLATE, FERRITIN, TIBC, IRON, RETICCTPCT in the last 72 hours. Urine  analysis:    Component Value Date/Time   COLORURINE STRAW (A) 06/20/2021 1505   APPEARANCEUR CLEAR 06/20/2021 1505   LABSPEC 1.020  06/20/2021 1505   PHURINE 6.0 06/20/2021 1505   GLUCOSEU >=500 (A) 06/20/2021 1505   HGBUR SMALL (A) 06/20/2021 1505   BILIRUBINUR NEGATIVE 06/20/2021 1505   KETONESUR 5 (A) 06/20/2021 1505   PROTEINUR NEGATIVE 06/20/2021 1505   UROBILINOGEN 0.2 08/16/2014 2204   NITRITE NEGATIVE 06/20/2021 1505   LEUKOCYTESUR MODERATE (A) 06/20/2021 1505   Sepsis Labs: @LABRCNTIP (procalcitonin:4,lacticidven:4) ) Recent Results (from the past 240 hour(s))  Resp Panel by RT-PCR (Flu A&B, Covid) Nasopharyngeal Swab     Status: None   Collection Time: 06/20/21  4:29 PM   Specimen: Nasopharyngeal Swab; Nasopharyngeal(NP) swabs in vial transport medium  Result Value Ref Range Status   SARS Coronavirus 2 by RT PCR NEGATIVE NEGATIVE Final    Comment: (NOTE) SARS-CoV-2 target nucleic acids are NOT DETECTED.  The SARS-CoV-2 RNA is generally detectable in upper respiratory specimens during the acute phase of infection. The lowest concentration of SARS-CoV-2 viral copies this assay can detect is 138 copies/mL. A negative result does not preclude SARS-Cov-2 infection and should not be used as the sole basis for treatment or other patient management decisions. A negative result may occur with  improper specimen collection/handling, submission of specimen other than nasopharyngeal swab, presence of viral mutation(s) within the areas targeted by this assay, and inadequate number of viral copies(<138 copies/mL). A negative result must be combined with clinical observations, patient history, and epidemiological information. The expected result is Negative.  Fact Sheet for Patients:  EntrepreneurPulse.com.au  Fact Sheet for Healthcare Providers:  IncredibleEmployment.be  This test is no t yet approved or cleared by the [place] FDA and   has been authorized for detection and/or diagnosis of SARS-CoV-2 by FDA under an Emergency Use Authorization (EUA). This EUA will remain  in effect (meaning this test can be used) for the duration of the COVID-19 declaration under Section 564(b)(1) of the Act, 21 U.S.C.section 360bbb-3(b)(1), unless the authorization is terminated  or revoked sooner.       Influenza A by PCR NEGATIVE NEGATIVE Final   Influenza B by PCR NEGATIVE NEGATIVE Final    Comment: (NOTE) The Xpert Xpress SARS-CoV-2/FLU/RSV plus assay is intended as an aid in the diagnosis of influenza from Nasopharyngeal swab specimens and should not be used as a sole basis for treatment. Nasal washings and aspirates are unacceptable for Xpert Xpress SARS-CoV-2/FLU/RSV testing.  Fact Sheet for Patients: EntrepreneurPulse.com.au  Fact Sheet for Healthcare Providers: IncredibleEmployment.be  This test is not yet approved or cleared by the [place] FDA and has been authorized for detection and/or diagnosis of SARS-CoV-2 by FDA under an Emergency Use Authorization (EUA). This EUA will remain in effect (meaning this test can be used) for the duration of the COVID-19 declaration under Section 564(b)(1) of the Act, 21 U.S.C. section 360bbb-3(b)(1), unless the authorization is terminated or revoked.  Performed at [facility], [place] [address] [place], [place] [address]      Radiological Exams on Admission: CT Head Wo Contrast  Result Date: 06/20/2021 CLINICAL DATA:  Head trauma, minor (Age >= 65y); Neck trauma (Age >= 65y) EXAM: CT HEAD WITHOUT CONTRAST CT CERVICAL SPINE WITHOUT CONTRAST TECHNIQUE: Multidetector CT imaging of the head and cervical spine was performed following the standard protocol without intravenous contrast. Multiplanar CT image reconstructions of the cervical spine were also generated. COMPARISON:  Apr 21, 2020 FINDINGS: CT HEAD FINDINGS Brain: No evidence of  acute infarction, hemorrhage, hydrocephalus, extra-axial collection or mass lesion/mass effect. Periventricular white matter hypodensities consistent with sequela  of chronic microvascular ischemic disease. Remote RIGHT basal ganglia lacunar infarction. Encephalomalacia of the RIGHT occipital lobe consistent with remote prior infarction; degree of encephalomalacia is greater than that seen on diffusion weighted imaging from Apr 21, 2020. Advanced global parenchymal volume loss for age. Vascular: Vascular calcifications. Skull: Normal. Negative for fracture or focal lesion. Sinuses/Orbits: No acute finding. Other: None. CT CERVICAL SPINE FINDINGS Alignment: Normal. Skull base and vertebrae: No acute fracture. No primary bone lesion or focal pathologic process. Soft tissues and spinal canal: No prevertebral fluid or swelling. No visible canal hematoma. Disc levels: Mild intervertebral disc space height loss with posterior disc osteophyte complex at C4-5. Multilevel facet arthropathy without high-grade neural foraminal narrowing. Upper chest: Negative. Other: Atherosclerotic calcifications of the carotid bulbs. IMPRESSION: 1. No acute intracranial abnormality. There is a remote RIGHT occipital infarction with corresponding encephalomalacia, increased since Apr 21, 2020. 2.  No acute fracture or static subluxation of the cervical spine. Electronically Signed   By: Valentino Saxon MD   On: 06/20/2021 19:08   CT Cervical Spine Wo Contrast  Result Date: 06/20/2021 CLINICAL DATA:  Head trauma, minor (Age >= 65y); Neck trauma (Age >= 65y) EXAM: CT HEAD WITHOUT CONTRAST CT CERVICAL SPINE WITHOUT CONTRAST TECHNIQUE: Multidetector CT imaging of the head and cervical spine was performed following the standard protocol without intravenous contrast. Multiplanar CT image reconstructions of the cervical spine were also generated. COMPARISON:  Apr 21, 2020 FINDINGS: CT HEAD FINDINGS Brain: No evidence of acute infarction,  hemorrhage, hydrocephalus, extra-axial collection or mass lesion/mass effect. Periventricular white matter hypodensities consistent with sequela of chronic microvascular ischemic disease. Remote RIGHT basal ganglia lacunar infarction. Encephalomalacia of the RIGHT occipital lobe consistent with remote prior infarction; degree of encephalomalacia is greater than that seen on diffusion weighted imaging from Apr 21, 2020. Advanced global parenchymal volume loss for age. Vascular: Vascular calcifications. Skull: Normal. Negative for fracture or focal lesion. Sinuses/Orbits: No acute finding. Other: None. CT CERVICAL SPINE FINDINGS Alignment: Normal. Skull base and vertebrae: No acute fracture. No primary bone lesion or focal pathologic process. Soft tissues and spinal canal: No prevertebral fluid or swelling. No visible canal hematoma. Disc levels: Mild intervertebral disc space height loss with posterior disc osteophyte complex at C4-5. Multilevel facet arthropathy without high-grade neural foraminal narrowing. Upper chest: Negative. Other: Atherosclerotic calcifications of the carotid bulbs. IMPRESSION: 1. No acute intracranial abnormality. There is a remote RIGHT occipital infarction with corresponding encephalomalacia, increased since Apr 21, 2020. 2.  No acute fracture or static subluxation of the cervical spine. Electronically Signed   By: Valentino Saxon MD   On: 06/20/2021 19:08   DG Pelvis Portable  Result Date: 06/20/2021 CLINICAL DATA:  Fall.  Pain. EXAM: PORTABLE PELVIS 1-2 VIEWS COMPARISON:  None. FINDINGS: No fracture or bone lesion. Hip joints, SI joints and symphysis pubis are normally aligned. There are arterial vascular calcifications. Soft tissues are otherwise unremarkable. IMPRESSION: 1. No fracture, dislocation or acute finding. Electronically Signed   By: Lajean Manes M.D.   On: 06/20/2021 16:01   DG Chest Port 1 View  Result Date: 06/20/2021 CLINICAL DATA:  Fall. EXAM: PORTABLE CHEST  1 VIEW COMPARISON:  03/14/2021 FINDINGS: Stable changes from prior CABG surgery. Cardiac silhouette is normal in size and configuration. No mediastinal or hilar masses. Clear lungs.  No pleural effusion or pneumothorax. Skeletal structures are grossly intact. IMPRESSION: No active disease. Electronically Signed   By: Lajean Manes M.D.   On: 06/20/2021 16:00   DG Humerus  Left  Result Date: 06/20/2021 CLINICAL DATA:  Bruising and pain in upper arm EXAM: LEFT HUMERUS - 2+ VIEW COMPARISON:  None. FINDINGS: No fracture or bone lesion. Shoulder and elbow joints are normally aligned. Soft tissues are unremarkable. IMPRESSION: Negative. Electronically Signed   By: Lajean Manes M.D.   On: 06/20/2021 15:59    EKG: Independently reviewed.  Normal sinus rhythm with nonspecific ST changes.  Assessment/Plan Active Problems:   Hypothyroidism   Essential hypertension, benign   COPD (chronic obstructive pulmonary disease) ([facility])   Hx of CABG   Hyperosmolar hyperglycemic state (HHS) ([place])   Acute delirium   Hyperosmolar non-ketotic state due to type 2 diabetes mellitus ([place])    Hyperosmolar nonketotic state with diabetes mellitus type 2 likely from noncompliant with medication presently on IV insulin infusion and fluids follow metabolic panel and CBGs closely. Acute delirium/acute encephalopathy at this time most likely causes her uncontrolled diabetes.  Patient had a similar situation in March when her blood sugars were very high and was confused.  We will closely monitor.  If mental status does not improve will consider further imaging.  Patient did receive 1 dose of Geodon in the ER. History of CAD status post CABG usually takes Brilinta Crestor metoprolol Imdur. Chronic kidney disease stage IV creatinine appears to be at baseline when compared to the 1 in Care Everywhere. Chronic anemia likely from renal disease hemoglobin is at baseline. History of agitation usually takes as needed Seroquel. History of  COPD presently not wheezing. History of peripheral vascular disease. History of hypothyroidism on Synthroid.  Will check TSH.   DVT prophylaxis: Lovenox. Code Status: DNR confirmed with patient's daughter. Family Communication: Patient's daughter. Disposition Plan: To be determined. Consults called: Diabetes educator. Admission status: Observation.   Rise Patience MD [facility] Pager [phone].  If 7PM-7AM, please contact night-coverage [email or web address] Password [facility]  06/20/2021, 8:41 PM

## 2021-06-20 NOTE — ED Notes (Signed)
Vaginal area red and irritated

## 2021-06-20 NOTE — ED Notes (Signed)
Pt confused  She reports that she lives with her daughter  Ems  Reports that the pt lives alone  bm cleaned off the pt legs feet  Pure wick placed   The pt moves  All extremities  Pt confused not oriented at all

## 2021-06-20 NOTE — Progress Notes (Signed)
06/20/21 @ 17:23 Arterial blood gas as follows: Ph 7.33 PO2 105 PCO2 33.3 Bicarb 17.9

## 2021-06-20 NOTE — ED Triage Notes (Signed)
Pt arrives via EMS from home with reports of frequent falls and CBG reading high. Pt alert to self and endorses left arm pain.

## 2021-06-20 NOTE — ED Notes (Signed)
Pt continues to be confused unable to leave her alone very long she is pulling at everything  Attempting to get out of bed

## 2021-06-20 NOTE — ED Notes (Signed)
The pt continues to pull everything off she has tried to bite off her oulse ox x2

## 2021-06-20 NOTE — ED Provider Notes (Signed)
[facility] EMERGENCY DEPARTMENT Provider Note   CSN: [id number] Arrival date & time: 06/20/21  1458     History Chief Complaint  Patient presents with   Hyperglycemia   Fall    Tonya Richard is a 75 y.o. female.  Patient reports to the ER confused reportedly had a fall complaining of left shoulder pain.  Unknown how long she had fallen.  EMS reports that the patient lives by herself but the patient states that her daughter lives with her.  Patient does not appear to be aware of where she is or what happened.  Unable to elicit history from the patient or complete review of systems from the patient due to her confusion.  History obtained from the daughter she states that the patient has been noncompliant with her medications, has been confused today and had fallen.      Past Medical History:  Diagnosis Date   Abnormal stress test 08/09/2017   Formatting of this note might be different from the original. Added automatically from request for surgery [phone]   Acute renal failure ([place]) 08/16/2014   Asthma    Asthma, chronic 08/16/2014   Benign essential hypertension 08/09/2017   CAD (coronary artery disease), native coronary artery    h/o CABG, stent   Chest pain 08/09/2017   Chronic coronary artery disease 04/16/2019   Formatting of this note might be different from the original. 3-Vessel   COPD (chronic obstructive pulmonary disease) ([place]) 10/13/2018   DM type 2 (diabetes mellitus, type 2) ([place])    Enteritis due to Clostridium difficile 08/17/2014   Essential hypertension, benign 08/16/2014   GERD (gastroesophageal reflux disease)    Hx of CABG 08/09/2017   Hyperkalemia, mild 08/16/2014   Hyperlipidemia    Hypertension    Hypoglycemia 08/16/2014   Hypothyroidism    Late effect of cerebrovascular accident (CVA) 03/18/2020   Long-term use of aspirin therapy 12/29/8655   Metabolic acidosis 8/46/9629   Mixed hyperlipidemia 08/09/2017   Morbid obesity ([place]) 08/16/2014    Myocardial infarction [facility])    PAD (peripheral artery disease) ([place]) 08/09/2017   Peripheral vascular disease ([place]) 05/12/2020   Pre-op evaluation 06/29/2016   Shortness of breath    Strep pharyngitis 08/16/2014   Type 2 diabetes mellitus with circulatory disorder, with long-term current use of insulin ([place]) 09/06/2017   Vomiting and diarrhea 08/16/2014    Patient Active Problem List   Diagnosis Date Noted   Hyperosmolar hyperglycemic state (HHS) ([place]) 06/20/2021   Shortness of breath    Myocardial infarction ([place])    Hypertension    Hyperlipidemia    AKI (acute kidney injury) ([place]) 12/11/2020   Peripheral vascular disease ([place]) 05/12/2020   Late effect of cerebrovascular accident (CVA) 03/18/2020   Long-term use of aspirin therapy 04/16/2019   Chronic coronary artery disease 04/16/2019   COPD (chronic obstructive pulmonary disease) ([place]) 10/13/2018   Type 2 diabetes mellitus with circulatory disorder, with long-term current use of insulin ([place]) 09/06/2017   Abnormal stress test 08/09/2017   Chest pain 08/09/2017   Hx of CABG 08/09/2017   Mixed hyperlipidemia 08/09/2017   PAD (peripheral artery disease) ([place]) 08/09/2017   Benign essential hypertension 08/09/2017   Pre-op evaluation 06/29/2016   Enteritis due to Clostridium difficile 08/17/2014   Acute renal failure ([place]) 08/16/2014   DM type 2 (diabetes mellitus, type 2) ([place]) 08/16/2014   CAD (coronary artery disease), native coronary artery 08/16/2014   Vomiting and diarrhea 08/16/2014   Strep pharyngitis  08/16/2014   Hypothyroidism 08/16/2014   GERD (gastroesophageal reflux disease) 08/16/2014   Hypoglycemia 08/16/2014   Essential hypertension, benign 08/16/2014   Asthma, chronic 51/70/0174   Metabolic acidosis 94/49/6759   Morbid obesity ([place]) 08/16/2014    Past Surgical History:  Procedure Laterality Date   CORONARY ARTERY BYPASS GRAFT     VAGINAL HYSTERECTOMY     WRIST SURGERY       OB History   No obstetric  history on file.     Family History  Problem Relation Age of Onset   Diabetes Father    Heart disease Father    Heart disease Brother     Social History   Tobacco Use   Smoking status: Former   Smokeless tobacco: Current    Types: Snuff  Substance Use Topics   Alcohol use: No   Drug use: No    Home Medications Prior to Admission medications   Medication Sig Start Date End Date Taking? Authorizing Provider  Alirocumab (PRALUENT) 150 MG/ML SOAJ Inject 1 Dose into the skin every 14 (fourteen) days. 04/15/20   Hilty, Nadean Corwin, MD  ALPRAZolam Duanne Moron) 0.5 MG tablet Take 0.5 mg by mouth at bedtime.     [provider]  Ascorbic Acid (VITAMIN C PO) Take by mouth daily.    [provider]  aspirin EC 325 MG tablet Take by mouth daily.     [provider]  BRILINTA 90 MG TABS tablet Take 1 tablet (90 mg total) by mouth 2 (two) times daily. 07/17/20   Park Liter, MD  budesonide-formoterol [facility]) 160-4.5 MCG/ACT inhaler Inhale 2 puffs into the lungs 2 (two) times daily.    [provider]  clopidogrel (PLAVIX) 75 MG tablet Take 1 tablet by mouth daily. 07/22/16   [provider]  Cyanocobalamin (VITAMIN B 12 PO) Take by mouth daily.    [provider]  Dulaglutide (TRULICITY) 1.5 FM/3.8GY SOPN Inject 1.5 mg into the skin once a week. 04/21/16   [provider]  ferrous sulfate 325 (65 FE) MG tablet Take 325 mg by mouth daily with breakfast.    [provider]  furosemide (LASIX) 40 MG tablet Take 0.5 tablets by mouth daily as needed. 12/12/20   [provider]  Insulin Glargine (BASAGLAR KWIKPEN) 100 UNIT/ML Inject 45 Units into the skin daily.     [provider]  ipratropium-albuterol (DUONEB) 0.5-2.5 (3) MG/3ML SOLN Take 3 mLs by nebulization 4 (four) times daily as needed (shortness of breath/wheezing).    [provider]  isosorbide mononitrate (IMDUR) 120 MG 24 hr tablet TAKE 1  TABLET(120 MG) BY MOUTH DAILY 04/07/21   Park Liter, MD  ketoconazole (NIZORAL) 2 % cream Apply topically daily as needed. 08/25/20   [provider]  levothyroxine (SYNTHROID, LEVOTHROID) 50 MCG tablet Take 50 mcg by mouth daily before breakfast.    [provider]  Melatonin 10 MG TABS Take by mouth at bedtime.    [provider]  metFORMIN (GLUCOPHAGE) 1000 MG tablet Take 1,000 mg by mouth 2 (two) times daily. 01/22/20   [provider]  metoprolol succinate (TOPROL-XL) 100 MG 24 hr tablet Take by mouth daily. Take with or immediately following a meal.     [provider]  nitroGLYCERIN (NITROSTAT) 0.4 MG SL tablet Place under the tongue. 03/21/18   [provider]  NOVOLOG FLEXPEN 100 UNIT/ML FlexPen INJECT AS DIRECTED PER SLIDING SCALE AND BLOOD SUGAR READINGS. MAX DAILY DOSE IS  50 UNITS 03/12/20   [provider]  oxybutynin (DITROPAN) 5 MG tablet Take 5 mg by mouth 2 (two) times daily.  11/16/19   [provider]  pantoprazole (PROTONIX) 40 MG tablet Take 40 mg by mouth daily.    [provider]  rosuvastatin (CRESTOR) 20 MG tablet Take 1 tablet (20 mg total) by mouth daily. 04/07/21   Hilty, Nadean Corwin, MD  tiZANidine (ZANAFLEX) 4 MG tablet Take 4 mg by mouth at bedtime as needed. 02/20/20   [provider]  traMADol (ULTRAM) 50 MG tablet Take by mouth every 6 (six) hours as needed.    [provider]  Vitamin D, Ergocalciferol, (DRISDOL) 1.25 MG (50000 UNIT) CAPS capsule Take 50,000 Units by mouth once a week. 02/19/20   [provider]    Allergies    Atorvastatin  Review of Systems   Review of Systems  Unable to perform ROS: Mental status change   Physical Exam Updated Vital Signs BP 101/64   Pulse 82   Temp 98.9 F (37.2 C) (Oral)   Resp 19   Ht 5\' 1"  (1.549 m)   Wt 66.2 kg   SpO2 99%   BMI 27.59 kg/m   Physical Exam Constitutional:      General: She is not in  acute distress.    Comments: Disheveled appearing  HENT:     Head: Normocephalic.     Nose: Nose normal.  Eyes:     Extraocular Movements: Extraocular movements intact.  Cardiovascular:     Rate and Rhythm: Normal rate.  Pulmonary:     Effort: Pulmonary effort is normal.  Musculoskeletal:        General: Normal range of motion.     Cervical back: Normal range of motion.  Neurological:     Mental Status: She is alert.     Comments: Patient awake alert answers my questions but appears confused.  Is not oriented to time or place.    ED Results / Procedures / Treatments   Labs (all labs ordered are listed, but only abnormal results are displayed) Labs Reviewed  BASIC METABOLIC PANEL - Abnormal; Notable for the following components:      Result Value   Sodium 122 (*)    Chloride 91 (*)    CO2 19 (*)    Glucose, Bld 914 (*)    BUN 37 (*)    Creatinine, Ser 2.34 (*)    GFR, Estimated 21 (*)    All other components within normal limits  CBC - Abnormal; Notable for the following components:   RBC 3.46 (*)    Hemoglobin 9.8 (*)    HCT 30.2 (*)    All other components within normal limits  URINALYSIS, ROUTINE W REFLEX MICROSCOPIC - Abnormal; Notable for the following components:   Color, Urine STRAW (*)    Glucose, UA >=500 (*)    Hgb urine dipstick SMALL (*)    Ketones, ur 5 (*)    Leukocytes,Ua MODERATE (*)    Bacteria, UA RARE (*)    All other components within normal limits  HEPATIC FUNCTION PANEL - Abnormal; Notable for the following components:   Total Protein 6.1 (*)    Albumin 3.2 (*)    AST 13 (*)    Alkaline Phosphatase 169 (*)    All other components within normal limits  BASIC METABOLIC PANEL - Abnormal; Notable for the following components:   Sodium 126 (*)    CO2 17 (*)  Glucose, Bld 776 (*)    BUN 36 (*)    Creatinine, Ser 2.05 (*)    GFR, Estimated 25 (*)    All other components within normal limits  OSMOLALITY - Abnormal; Notable for the following  components:   Osmolality 319 (*)    All other components within normal limits  CBG MONITORING, ED - Abnormal; Notable for the following components:   Glucose-Capillary >600 (*)    All other components within normal limits  I-STAT ARTERIAL BLOOD GAS, ED - Abnormal; Notable for the following components:   pH, Arterial 7.497 (*)    pCO2 arterial 20.4 (*)    pO2, Arterial 55 (*)    Bicarbonate 17.9 (*)    TCO2 19 (*)    Acid-base deficit 7.0 (*)    Sodium 128 (*)    HCT 26.0 (*)    Hemoglobin 8.8 (*)    All other components within normal limits  CBG MONITORING, ED - Abnormal; Notable for the following components:   Glucose-Capillary >600 (*)    All other components within normal limits  CBG MONITORING, ED - Abnormal; Notable for the following components:   Glucose-Capillary 587 (*)    All other components within normal limits  CBG MONITORING, ED - Abnormal; Notable for the following components:   Glucose-Capillary 584 (*)    All other components within normal limits  CBG MONITORING, ED - Abnormal; Notable for the following components:   Glucose-Capillary 511 (*)    All other components within normal limits  RESP PANEL BY RT-PCR (FLU A&B, COVID) ARPGX2  URINE CULTURE  MAGNESIUM  BETA-HYDROXYBUTYRIC ACID  BASIC METABOLIC PANEL  BASIC METABOLIC PANEL  BASIC METABOLIC PANEL  HEMOGLOBIN A1C    EKG EKG Interpretation  Date/Time:  Saturday June 20 2021 14:59:27 EDT Ventricular Rate:  99 PR Interval:  158 QRS Duration: 98 QT Interval:  368 QTC Calculation: 473 R Axis:   71 Text Interpretation: Sinus rhythm Nonspecific repol abnormality, diffuse leads Confirmed by Thamas Jaegers ([id number]) on 06/20/2021 4:16:12 PM  Radiology CT Head Wo Contrast  Result Date: 06/20/2021 CLINICAL DATA:  Head trauma, minor (Age >= 65y); Neck trauma (Age >= 65y) EXAM: CT HEAD WITHOUT CONTRAST CT CERVICAL SPINE WITHOUT CONTRAST TECHNIQUE: Multidetector CT imaging of the head and cervical spine was  performed following the standard protocol without intravenous contrast. Multiplanar CT image reconstructions of the cervical spine were also generated. COMPARISON:  Apr 21, 2020 FINDINGS: CT HEAD FINDINGS Brain: No evidence of acute infarction, hemorrhage, hydrocephalus, extra-axial collection or mass lesion/mass effect. Periventricular white matter hypodensities consistent with sequela of chronic microvascular ischemic disease. Remote RIGHT basal ganglia lacunar infarction. Encephalomalacia of the RIGHT occipital lobe consistent with remote prior infarction; degree of encephalomalacia is greater than that seen on diffusion weighted imaging from Apr 21, 2020. Advanced global parenchymal volume loss for age. Vascular: Vascular calcifications. Skull: Normal. Negative for fracture or focal lesion. Sinuses/Orbits: No acute finding. Other: None. CT CERVICAL SPINE FINDINGS Alignment: Normal. Skull base and vertebrae: No acute fracture. No primary bone lesion or focal pathologic process. Soft tissues and spinal canal: No prevertebral fluid or swelling. No visible canal hematoma. Disc levels: Mild intervertebral disc space height loss with posterior disc osteophyte complex at C4-5. Multilevel facet arthropathy without high-grade neural foraminal narrowing. Upper chest: Negative. Other: Atherosclerotic calcifications of the carotid bulbs. IMPRESSION: 1. No acute intracranial abnormality. There is a remote RIGHT occipital infarction with corresponding encephalomalacia, increased since Apr 21, 2020. 2.  No acute fracture  or static subluxation of the cervical spine. Electronically Signed   By: Valentino Saxon MD   On: 06/20/2021 19:08   CT Cervical Spine Wo Contrast  Result Date: 06/20/2021 CLINICAL DATA:  Head trauma, minor (Age >= 65y); Neck trauma (Age >= 65y) EXAM: CT HEAD WITHOUT CONTRAST CT CERVICAL SPINE WITHOUT CONTRAST TECHNIQUE: Multidetector CT imaging of the head and cervical spine was performed following the  standard protocol without intravenous contrast. Multiplanar CT image reconstructions of the cervical spine were also generated. COMPARISON:  Apr 21, 2020 FINDINGS: CT HEAD FINDINGS Brain: No evidence of acute infarction, hemorrhage, hydrocephalus, extra-axial collection or mass lesion/mass effect. Periventricular white matter hypodensities consistent with sequela of chronic microvascular ischemic disease. Remote RIGHT basal ganglia lacunar infarction. Encephalomalacia of the RIGHT occipital lobe consistent with remote prior infarction; degree of encephalomalacia is greater than that seen on diffusion weighted imaging from Apr 21, 2020. Advanced global parenchymal volume loss for age. Vascular: Vascular calcifications. Skull: Normal. Negative for fracture or focal lesion. Sinuses/Orbits: No acute finding. Other: None. CT CERVICAL SPINE FINDINGS Alignment: Normal. Skull base and vertebrae: No acute fracture. No primary bone lesion or focal pathologic process. Soft tissues and spinal canal: No prevertebral fluid or swelling. No visible canal hematoma. Disc levels: Mild intervertebral disc space height loss with posterior disc osteophyte complex at C4-5. Multilevel facet arthropathy without high-grade neural foraminal narrowing. Upper chest: Negative. Other: Atherosclerotic calcifications of the carotid bulbs. IMPRESSION: 1. No acute intracranial abnormality. There is a remote RIGHT occipital infarction with corresponding encephalomalacia, increased since Apr 21, 2020. 2.  No acute fracture or static subluxation of the cervical spine. Electronically Signed   By: Valentino Saxon MD   On: 06/20/2021 19:08   DG Pelvis Portable  Result Date: 06/20/2021 CLINICAL DATA:  Fall.  Pain. EXAM: PORTABLE PELVIS 1-2 VIEWS COMPARISON:  None. FINDINGS: No fracture or bone lesion. Hip joints, SI joints and symphysis pubis are normally aligned. There are arterial vascular calcifications. Soft tissues are otherwise unremarkable.  IMPRESSION: 1. No fracture, dislocation or acute finding. Electronically Signed   By: Lajean Manes M.D.   On: 06/20/2021 16:01   DG Chest Port 1 View  Result Date: 06/20/2021 CLINICAL DATA:  Fall. EXAM: PORTABLE CHEST 1 VIEW COMPARISON:  03/14/2021 FINDINGS: Stable changes from prior CABG surgery. Cardiac silhouette is normal in size and configuration. No mediastinal or hilar masses. Clear lungs.  No pleural effusion or pneumothorax. Skeletal structures are grossly intact. IMPRESSION: No active disease. Electronically Signed   By: Lajean Manes M.D.   On: 06/20/2021 16:00   DG Humerus Left  Result Date: 06/20/2021 CLINICAL DATA:  Bruising and pain in upper arm EXAM: LEFT HUMERUS - 2+ VIEW COMPARISON:  None. FINDINGS: No fracture or bone lesion. Shoulder and elbow joints are normally aligned. Soft tissues are unremarkable. IMPRESSION: Negative. Electronically Signed   By: Lajean Manes M.D.   On: 06/20/2021 15:59    Procedures .Critical Care  Date/Time: 06/20/2021 4:25 PM Performed by: Luna Fuse, MD Authorized by: Luna Fuse, MD   Critical care provider statement:    Critical care time (minutes):  30   Critical care time was exclusive of:  Separately billable procedures and treating other patients and teaching time   Critical care was necessary to treat or prevent imminent or life-threatening deterioration of the following conditions:  Metabolic crisis   Medications Ordered in ED Medications  insulin regular, human (MYXREDLIN) 100 units/ 100 mL infusion (4.2 Units/hr Intravenous Rate/Dose Change  06/20/21 1941)  lactated ringers infusion (has no administration in time range)  dextrose 5 % in lactated ringers infusion (has no administration in time range)  dextrose 50 % solution 0-50 mL (has no administration in time range)  potassium chloride 10 mEq in 100 mL IVPB (10 mEq Intravenous New Bag/Given 06/20/21 1803)  cefTRIAXone (ROCEPHIN) 1 g in sodium chloride 0.9 % 100 mL IVPB (1 g  Intravenous New Bag/Given 06/20/21 1952)  potassium chloride 10 mEq in 100 mL IVPB (10 mEq Intravenous New Bag/Given 06/20/21 1944)  sodium chloride 0.9 % bolus 1,000 mL (0 mLs Intravenous Stopped 06/20/21 1627)  lactated ringers bolus 1,324 mL (1,324 mLs Intravenous New Bag/Given 06/20/21 1745)  ziprasidone (GEODON) injection 10 mg (10 mg Intramuscular Given 06/20/21 1829)    ED Course  I have reviewed the triage vital signs and the nursing notes.  Pertinent labs & imaging results that were available during my care of the patient were reviewed by me and considered in my medical decision making (see chart for details).    MDM Rules/Calculators/A&P                           Patient blood glucose severely elevated greater than 900.  Bicarb is 17, however anion gap is only 12.  Concern for hyperosmolar hyperglycemia.  Patient required Geodon for sedation due to her confusion and agitation.  CT head is unremarkable for acute findings.  Patient started on insulin drip.  Consultation with ICU, recommending stepdown unit.  Hospitalists consulted.   Final Clinical Impression(s) / ED Diagnoses Final diagnoses:  Secondary diabetes mellitus with HHNC (hyperglycemia hyperosmolar non-ketotic coma) ([place])  Acute encephalopathy    Rx / DC Orders ED Discharge Orders     None        Luna Fuse, MD 06/20/21 2009

## 2021-06-20 NOTE — ED Notes (Signed)
Daughter Judeen Hammans [phone] would like an update

## 2021-06-21 DIAGNOSIS — D62 Acute posthemorrhagic anemia: Secondary | ICD-10-CM | POA: Diagnosis not present

## 2021-06-21 DIAGNOSIS — N184 Chronic kidney disease, stage 4 (severe): Secondary | ICD-10-CM | POA: Diagnosis not present

## 2021-06-21 DIAGNOSIS — N390 Urinary tract infection, site not specified: Secondary | ICD-10-CM | POA: Diagnosis present

## 2021-06-21 DIAGNOSIS — R296 Repeated falls: Secondary | ICD-10-CM | POA: Diagnosis present

## 2021-06-21 DIAGNOSIS — Z833 Family history of diabetes mellitus: Secondary | ICD-10-CM | POA: Diagnosis not present

## 2021-06-21 DIAGNOSIS — E11649 Type 2 diabetes mellitus with hypoglycemia without coma: Secondary | ICD-10-CM | POA: Diagnosis not present

## 2021-06-21 DIAGNOSIS — G9340 Encephalopathy, unspecified: Secondary | ICD-10-CM | POA: Diagnosis not present

## 2021-06-21 DIAGNOSIS — I129 Hypertensive chronic kidney disease with stage 1 through stage 4 chronic kidney disease, or unspecified chronic kidney disease: Secondary | ICD-10-CM | POA: Diagnosis present

## 2021-06-21 DIAGNOSIS — R410 Disorientation, unspecified: Secondary | ICD-10-CM | POA: Diagnosis not present

## 2021-06-21 DIAGNOSIS — J449 Chronic obstructive pulmonary disease, unspecified: Secondary | ICD-10-CM | POA: Diagnosis present

## 2021-06-21 DIAGNOSIS — Z87891 Personal history of nicotine dependence: Secondary | ICD-10-CM | POA: Diagnosis not present

## 2021-06-21 DIAGNOSIS — E1100 Type 2 diabetes mellitus with hyperosmolarity without nonketotic hyperglycemic-hyperosmolar coma (NKHHC): Secondary | ICD-10-CM | POA: Diagnosis not present

## 2021-06-21 DIAGNOSIS — Z7982 Long term (current) use of aspirin: Secondary | ICD-10-CM | POA: Diagnosis not present

## 2021-06-21 DIAGNOSIS — I10 Essential (primary) hypertension: Secondary | ICD-10-CM | POA: Diagnosis not present

## 2021-06-21 DIAGNOSIS — Z20822 Contact with and (suspected) exposure to covid-19: Secondary | ICD-10-CM | POA: Diagnosis not present

## 2021-06-21 DIAGNOSIS — Z8249 Family history of ischemic heart disease and other diseases of the circulatory system: Secondary | ICD-10-CM | POA: Diagnosis not present

## 2021-06-21 DIAGNOSIS — E1301 Other specified diabetes mellitus with hyperosmolarity with coma: Secondary | ICD-10-CM | POA: Diagnosis not present

## 2021-06-21 DIAGNOSIS — E1165 Type 2 diabetes mellitus with hyperglycemia: Secondary | ICD-10-CM

## 2021-06-21 DIAGNOSIS — E1122 Type 2 diabetes mellitus with diabetic chronic kidney disease: Secondary | ICD-10-CM | POA: Diagnosis not present

## 2021-06-21 DIAGNOSIS — E782 Mixed hyperlipidemia: Secondary | ICD-10-CM | POA: Diagnosis present

## 2021-06-21 DIAGNOSIS — I2510 Atherosclerotic heart disease of native coronary artery without angina pectoris: Secondary | ICD-10-CM | POA: Diagnosis present

## 2021-06-21 DIAGNOSIS — I252 Old myocardial infarction: Secondary | ICD-10-CM | POA: Diagnosis not present

## 2021-06-21 DIAGNOSIS — G9341 Metabolic encephalopathy: Secondary | ICD-10-CM | POA: Diagnosis not present

## 2021-06-21 DIAGNOSIS — Z90710 Acquired absence of both cervix and uterus: Secondary | ICD-10-CM | POA: Diagnosis not present

## 2021-06-21 DIAGNOSIS — E1151 Type 2 diabetes mellitus with diabetic peripheral angiopathy without gangrene: Secondary | ICD-10-CM | POA: Diagnosis present

## 2021-06-21 DIAGNOSIS — Z66 Do not resuscitate: Secondary | ICD-10-CM | POA: Diagnosis not present

## 2021-06-21 DIAGNOSIS — E440 Moderate protein-calorie malnutrition: Secondary | ICD-10-CM | POA: Diagnosis not present

## 2021-06-21 DIAGNOSIS — D631 Anemia in chronic kidney disease: Secondary | ICD-10-CM | POA: Diagnosis present

## 2021-06-21 DIAGNOSIS — E039 Hypothyroidism, unspecified: Secondary | ICD-10-CM | POA: Diagnosis not present

## 2021-06-21 LAB — BASIC METABOLIC PANEL
Anion gap: 8 (ref 5–15)
Anion gap: 9 (ref 5–15)
BUN: 28 mg/dL — ABNORMAL HIGH (ref 8–23)
BUN: 27 mg/dL — ABNORMAL HIGH (ref 8–23)
CO2: 21 mmol/L — ABNORMAL LOW (ref 22–32)
CO2: 21 mmol/L — ABNORMAL LOW (ref 22–32)
Calcium: 9.6 mg/dL (ref 8.9–10.3)
Calcium: 9.7 mg/dL (ref 8.9–10.3)
Chloride: 109 mmol/L (ref 98–111)
Chloride: 109 mmol/L (ref 98–111)
Creatinine, Ser: 1.77 mg/dL — ABNORMAL HIGH (ref 0.44–1.00)
Creatinine, Ser: 1.71 mg/dL — ABNORMAL HIGH (ref 0.44–1.00)
GFR, Estimated: 30 mL/min — ABNORMAL LOW (ref >60)
GFR, Estimated: 31 mL/min — ABNORMAL LOW (ref >60)
Glucose, Bld: 120 mg/dL — ABNORMAL HIGH (ref 70–99)
Glucose, Bld: 194 mg/dL — ABNORMAL HIGH (ref 70–99)
Potassium: 3.5 mmol/L (ref 3.5–5.1)
Potassium: 3.5 mmol/L (ref 3.5–5.1)
Sodium: 138 mmol/L (ref 135–145)
Sodium: 139 mmol/L (ref 135–145)

## 2021-06-21 LAB — GLUCOSE, CAPILLARY
Glucose-Capillary: 102 mg/dL — ABNORMAL HIGH (ref 70–99)
Glucose-Capillary: 107 mg/dL — ABNORMAL HIGH (ref 70–99)
Glucose-Capillary: 135 mg/dL — ABNORMAL HIGH (ref 70–99)
Glucose-Capillary: 160 mg/dL — ABNORMAL HIGH (ref 70–99)
Glucose-Capillary: 177 mg/dL — ABNORMAL HIGH (ref 70–99)
Glucose-Capillary: 183 mg/dL — ABNORMAL HIGH (ref 70–99)
Glucose-Capillary: 65 mg/dL — ABNORMAL LOW (ref 70–99)
Glucose-Capillary: 69 mg/dL — ABNORMAL LOW (ref 70–99)
Glucose-Capillary: 74 mg/dL (ref 70–99)
Glucose-Capillary: 90 mg/dL (ref 70–99)
Glucose-Capillary: 99 mg/dL (ref 70–99)

## 2021-06-21 LAB — TROPONIN I (HIGH SENSITIVITY): Troponin I (High Sensitivity): 14 ng/L (ref <18)

## 2021-06-21 MED ORDER — METOPROLOL TARTRATE 5 MG/5ML IV SOLN
5.0000 mg | Freq: Four times a day (QID) | INTRAVENOUS | Status: DC
  Administered 2021-06-21 – 2021-06-24 (×12): 5 mg via INTRAVENOUS
  Filled 2021-06-21 (×13): qty 5

## 2021-06-21 MED ORDER — QUETIAPINE FUMARATE 25 MG PO TABS: 25.0000 mg | ORAL_TABLET | Freq: Every day | ORAL | Status: DC

## 2021-06-21 MED ORDER — HALOPERIDOL LACTATE 5 MG/ML IJ SOLN
2.0000 mg | Freq: Four times a day (QID) | INTRAMUSCULAR | Status: DC | PRN
  Administered 2021-06-21 – 2021-06-22 (×2): 2 mg via INTRAVENOUS
  Filled 2021-06-21 (×2): qty 1

## 2021-06-21 MED ORDER — TICAGRELOR 90 MG PO TABS
90.0000 mg | ORAL_TABLET | Freq: Two times a day (BID) | ORAL | Status: DC
  Administered 2021-06-22 – 2021-06-30 (×17): 90 mg via ORAL
  Filled 2021-06-21 (×17): qty 1

## 2021-06-21 MED ORDER — LORAZEPAM 2 MG/ML IJ SOLN
0.5000 mg | Freq: Once | INTRAMUSCULAR | Status: AC
  Administered 2021-06-21: 0.5 mg via INTRAVENOUS
  Filled 2021-06-21: qty 1

## 2021-06-21 MED ORDER — INSULIN GLARGINE 100 UNIT/ML ~~LOC~~ SOLN
50.0000 [IU] | Freq: Every day | SUBCUTANEOUS | Status: DC
  Administered 2021-06-21: 50 [IU] via SUBCUTANEOUS
  Filled 2021-06-21 (×2): qty 0.5

## 2021-06-21 MED ORDER — LORAZEPAM 2 MG/ML IJ SOLN
0.5000 mg | Freq: Four times a day (QID) | INTRAMUSCULAR | Status: DC | PRN
  Administered 2021-06-21: 0.5 mg via INTRAVENOUS
  Filled 2021-06-21 (×2): qty 1

## 2021-06-21 MED ORDER — INSULIN ASPART 100 UNIT/ML IJ SOLN
0.0000 [IU] | Freq: Every day | INTRAMUSCULAR | Status: DC
Start: 2021-06-21 — End: 2021-06-22

## 2021-06-21 MED ORDER — DEXTROSE-NACL 5-0.45 % IV SOLN: INTRAVENOUS | Status: DC

## 2021-06-21 MED ORDER — ACETAMINOPHEN 325 MG PO TABS
650.0000 mg | ORAL_TABLET | Freq: Four times a day (QID) | ORAL | Status: DC | PRN
  Administered 2021-06-21 – 2021-06-29 (×4): 650 mg via ORAL
  Filled 2021-06-21 (×4): qty 2

## 2021-06-21 MED ORDER — POTASSIUM CHLORIDE 10 MEQ/100ML IV SOLN
10.0000 meq | Freq: Once | INTRAVENOUS | Status: AC
  Administered 2021-06-21: 10 meq via INTRAVENOUS
  Filled 2021-06-21: qty 100

## 2021-06-21 MED ORDER — LACTATED RINGERS IV SOLN: INTRAVENOUS | Status: DC

## 2021-06-21 MED ORDER — ISOSORBIDE MONONITRATE ER 60 MG PO TB24
120.0000 mg | ORAL_TABLET | Freq: Every day | ORAL | Status: DC
  Administered 2021-06-23 – 2021-06-30 (×8): 120 mg via ORAL
  Filled 2021-06-21 (×9): qty 2

## 2021-06-21 MED ORDER — INSULIN ASPART 100 UNIT/ML IJ SOLN
0.0000 [IU] | Freq: Three times a day (TID) | INTRAMUSCULAR | Status: DC
  Administered 2021-06-21: 2 [IU] via SUBCUTANEOUS

## 2021-06-21 MED ORDER — SODIUM CHLORIDE 0.9 % IV SOLN: INTRAVENOUS | Status: DC

## 2021-06-21 MED ORDER — LORAZEPAM 2 MG/ML IJ SOLN
0.5000 mg | INTRAMUSCULAR | Status: DC | PRN
  Administered 2021-06-21 – 2021-06-22 (×2): 0.5 mg via INTRAVENOUS
  Filled 2021-06-21 (×3): qty 1

## 2021-06-21 NOTE — Progress Notes (Addendum)
PROGRESS NOTE    Tonya Richard  HDQ:[id number] DOB: [birth year] DOA: 06/20/2021 PCP: Penelope Coop, FNP     Brief Narrative:  Tonya Richard is a 75 y.o. female with history of diabetes mellitus type 2, CAD status post CABG, peripheral vascular disease, chronic kidney disease stage IV, chronic anemia was brought to the ER after patient's daughter found that patient was getting increasingly confused over the last 3 days has been having frequent falls and blood sugar has been running high.  Patient daughter states that she was admitted a similar situation in March of this year at [facility] and patient had very high blood sugar eventually was discharged to rehab and after discharge patient's daughter has been living with her.  Initially she was very compliant with the medication but last few weeks she has been very resistant to taking medications.  Hemoglobin A1c last 1 was around 14 2 weeks ago as per the patient's daughter.  Patient was found to have HHS, started on IV insulin.  New events last 24 hours / Subjective: Patient remains very confused this morning, is arousable to voice but does not open her eyes, has mittens on in place.  Per nursing, has been trying to get out of bed.  Assessment & Plan:   Active Problems:   Hypothyroidism   Essential hypertension, benign   COPD (chronic obstructive pulmonary disease) (HCC)   Hx of CABG   Hyperosmolar hyperglycemic state (HHS) ([place])   Acute delirium   Hyperosmolar non-ketotic state due to type 2 diabetes mellitus ([place])    HHS -Resolved after IV insulin -Appreciate diabetes coordinator -Continue Lantus, sliding scale insulin  Acute metabolic encephalopathy -Thought to be secondary to above -CT head without acute intracranial abnormality, there is a remote right occipital infarction with corresponding encephalomalacia -Not back to baseline  Coronary artery disease status post CABG -Continue Brilinta, Crestor (resume when  patient able to take p.o.), metoprolol (changed to IV in setting of decreased p.o.), Imdur  ?  UTI -UA showed moderate leukocytes and rare bacteria.  She was started on empiric Rocephin -Urine culture pending  CKD stage IV -Stable  Hypothyroidism -Continue Synthroid   DVT prophylaxis:  enoxaparin (LOVENOX) injection 30 mg Start: 06/20/21 2100  Code Status:     Code Status Orders  (From admission, onward)           Start     Ordered   06/20/21 2121  Do not attempt resuscitation (DNR)  Continuous       Question Answer Comment  In the event of cardiac or respiratory ARREST Do not call a "code blue"   In the event of cardiac or respiratory ARREST Do not perform Intubation, CPR, defibrillation or ACLS   In the event of cardiac or respiratory ARREST Use medication by any route, position, wound care, and other measures to relive pain and suffering. May use oxygen, suction and manual treatment of airway obstruction as needed for comfort.      06/20/21 2120           Code Status History     Date Active Date Inactive Code Status Order ID Comments User Context   06/20/2021 2041 06/20/2021 2120 Full Code [id number]  Rise Patience, MD ED   08/16/2014 1707 08/20/2014 1655 Full Code [id number]  Delfina Redwood, MD Inpatient      Family Communication: Spoke with daughter over the phone  Disposition Plan:  Status is: Observation  The patient will require care  spanning > 2 midnights and should be moved to inpatient because: Altered mental status and IV treatments appropriate due to intensity of illness or inability to take PO  Dispo: The patient is from: Home              Anticipated d/c is to:  To be determined              Patient currently is not medically stable to d/c.   Difficult to place patient No     Antimicrobials:  Anti-infectives (From admission, onward)    Start     Dose/Rate Route Frequency Ordered Stop   06/21/21 2000  cefTRIAXone (ROCEPHIN) 1 g in  sodium chloride 0.9 % 100 mL IVPB        1 g 200 mL/hr over 30 Minutes Intravenous Every 24 hours 06/20/21 2106     06/20/21 1915  cefTRIAXone (ROCEPHIN) 1 g in sodium chloride 0.9 % 100 mL IVPB        1 g 200 mL/hr over 30 Minutes Intravenous  Once 06/20/21 1903 06/20/21 2127        Objective: Vitals:   06/21/21 0317 06/21/21 0745 06/21/21 1120 06/21/21 1218  BP: 131/69 (!) 159/88 (!) 162/46 (!) 171/99  Pulse: 80 78 80 84  Resp: 19 18 19 19   Temp: 98.8 F (37.1 C) (!) 97.4 F (36.3 C) 97.6 F (36.4 C) 97.6 F (36.4 C)  TempSrc: Axillary Oral Axillary Axillary  SpO2: 93% 99% 100% 99%  Weight:      Height:        Intake/Output Summary (Last 24 hours) at 06/21/2021 1254 Last data filed at 06/21/2021 0615 Gross per 24 hour  Intake 2488.52 ml  Output 200 ml  Net 2288.52 ml   Filed Weights   06/20/21 1501 06/20/21 2348  Weight: 66.2 kg 65.8 kg    Examination:  General exam: Not acute distress Respiratory system: Clear to auscultation. Respiratory effort normal. No respiratory distress.  Cardiovascular system: S1 & S2 heard, RRR. No murmurs. No pedal edema. Gastrointestinal system: Abdomen is nondistended, soft and nontender. Normal bowel sounds heard. Central nervous system: Arousable to voice but does not open her eyes Extremities: Symmetric in appearance  Skin: No rashes, lesions or ulcers on exposed skin    Data Reviewed: I have personally reviewed following labs and imaging studies  CBC: Recent Labs  Lab 06/20/21 1504 06/20/21 1702 06/20/21 2140  WBC 9.0  --  9.3  HGB 9.8* 8.8* 9.5*  HCT 30.2* 26.0* 29.2*  MCV 87.3  --  88.2  PLT 185  --  035   Basic Metabolic Panel: Recent Labs  Lab 06/20/21 1504 06/20/21 1650 06/20/21 1702 06/20/21 2140 06/21/21 0156 06/21/21 0619  NA 122* 126* 128* 133* 138 139  K 4.0 3.9 3.8 3.5 3.5 3.5  CL 91* 99  --  106 109 109  CO2 19* 17*  --  18* 21* 21*  GLUCOSE 914* 776*  --  368* 120* 194*  BUN 37* 36*  --  32*  28* 27*  CREATININE 2.34* 2.05*  --  1.95* 1.77* 1.71*  CALCIUM 9.5 8.9  --  9.3 9.6 9.7  MG 2.3  --   --   --   --   --    GFR: Estimated Creatinine Clearance: 25.4 mL/min (A) (by C-G formula based on SCr of 1.71 mg/dL (H)). Liver Function Tests: Recent Labs  Lab 06/20/21 1504  AST 13*  ALT 24  ALKPHOS 169*  BILITOT 0.8  PROT 6.1*  ALBUMIN 3.2*   No results for input(s): LIPASE, AMYLASE in the last 168 hours. No results for input(s): AMMONIA in the last 168 hours. Coagulation Profile: No results for input(s): INR, PROTIME in the last 168 hours. Cardiac Enzymes: No results for input(s): CKTOTAL, CKMB, CKMBINDEX, TROPONINI in the last 168 hours. BNP (last 3 results) No results for input(s): PROBNP in the last 8760 hours. HbA1C: No results for input(s): HGBA1C in the last 72 hours. CBG: Recent Labs  Lab 06/21/21 0220 06/21/21 0322 06/21/21 0428 06/21/21 0532 06/21/21 1212  GLUCAP 135* 160* 183* 177* 90   Lipid Profile: No results for input(s): CHOL, HDL, LDLCALC, TRIG, CHOLHDL, LDLDIRECT in the last 72 hours. Thyroid Function Tests: Recent Labs    06/20/21 2140  TSH 3.881  FREET4 1.03   Anemia Panel: No results for input(s): VITAMINB12, FOLATE, FERRITIN, TIBC, IRON, RETICCTPCT in the last 72 hours. Sepsis Labs: No results for input(s): PROCALCITON, LATICACIDVEN in the last 168 hours.  Recent Results (from the past 240 hour(s))  Resp Panel by RT-PCR (Flu A&B, Covid) Nasopharyngeal Swab     Status: None   Collection Time: 06/20/21  4:29 PM   Specimen: Nasopharyngeal Swab; Nasopharyngeal(NP) swabs in vial transport medium  Result Value Ref Range Status   SARS Coronavirus 2 by RT PCR NEGATIVE NEGATIVE Final    Comment: (NOTE) SARS-CoV-2 target nucleic acids are NOT DETECTED.  The SARS-CoV-2 RNA is generally detectable in upper respiratory specimens during the acute phase of infection. The lowest concentration of SARS-CoV-2 viral copies this assay can detect  is 138 copies/mL. A negative result does not preclude SARS-Cov-2 infection and should not be used as the sole basis for treatment or other patient management decisions. A negative result may occur with  improper specimen collection/handling, submission of specimen other than nasopharyngeal swab, presence of viral mutation(s) within the areas targeted by this assay, and inadequate number of viral copies(<138 copies/mL). A negative result must be combined with clinical observations, patient history, and epidemiological information. The expected result is Negative.  Fact Sheet for Patients:  EntrepreneurPulse.com.au  Fact Sheet for Healthcare Providers:  IncredibleEmployment.be  This test is no t yet approved or cleared by the [place] FDA and  has been authorized for detection and/or diagnosis of SARS-CoV-2 by FDA under an Emergency Use Authorization (EUA). This EUA will remain  in effect (meaning this test can be used) for the duration of the COVID-19 declaration under Section 564(b)(1) of the Act, 21 U.S.C.section 360bbb-3(b)(1), unless the authorization is terminated  or revoked sooner.       Influenza A by PCR NEGATIVE NEGATIVE Final   Influenza B by PCR NEGATIVE NEGATIVE Final    Comment: (NOTE) The Xpert Xpress SARS-CoV-2/FLU/RSV plus assay is intended as an aid in the diagnosis of influenza from Nasopharyngeal swab specimens and should not be used as a sole basis for treatment. Nasal washings and aspirates are unacceptable for Xpert Xpress SARS-CoV-2/FLU/RSV testing.  Fact Sheet for Patients: EntrepreneurPulse.com.au  Fact Sheet for Healthcare Providers: IncredibleEmployment.be  This test is not yet approved or cleared by the [place] FDA and has been authorized for detection and/or diagnosis of SARS-CoV-2 by FDA under an Emergency Use Authorization (EUA). This EUA will remain in effect  (meaning this test can be used) for the duration of the COVID-19 declaration under Section 564(b)(1) of the Act, 21 U.S.C. section 360bbb-3(b)(1), unless the authorization is terminated or revoked.  Performed at [facility],  [address], [place], [place] [address]       Radiology Studies: CT Head Wo Contrast  Result Date: 06/20/2021 CLINICAL DATA:  Head trauma, minor (Age >= 65y); Neck trauma (Age >= 65y) EXAM: CT HEAD WITHOUT CONTRAST CT CERVICAL SPINE WITHOUT CONTRAST TECHNIQUE: Multidetector CT imaging of the head and cervical spine was performed following the standard protocol without intravenous contrast. Multiplanar CT image reconstructions of the cervical spine were also generated. COMPARISON:  Apr 21, 2020 FINDINGS: CT HEAD FINDINGS Brain: No evidence of acute infarction, hemorrhage, hydrocephalus, extra-axial collection or mass lesion/mass effect. Periventricular white matter hypodensities consistent with sequela of chronic microvascular ischemic disease. Remote RIGHT basal ganglia lacunar infarction. Encephalomalacia of the RIGHT occipital lobe consistent with remote prior infarction; degree of encephalomalacia is greater than that seen on diffusion weighted imaging from Apr 21, 2020. Advanced global parenchymal volume loss for age. Vascular: Vascular calcifications. Skull: Normal. Negative for fracture or focal lesion. Sinuses/Orbits: No acute finding. Other: None. CT CERVICAL SPINE FINDINGS Alignment: Normal. Skull base and vertebrae: No acute fracture. No primary bone lesion or focal pathologic process. Soft tissues and spinal canal: No prevertebral fluid or swelling. No visible canal hematoma. Disc levels: Mild intervertebral disc space height loss with posterior disc osteophyte complex at C4-5. Multilevel facet arthropathy without high-grade neural foraminal narrowing. Upper chest: Negative. Other: Atherosclerotic calcifications of the carotid bulbs. IMPRESSION: 1. No acute  intracranial abnormality. There is a remote RIGHT occipital infarction with corresponding encephalomalacia, increased since Apr 21, 2020. 2.  No acute fracture or static subluxation of the cervical spine. Electronically Signed   By: Valentino Saxon MD   On: 06/20/2021 19:08   CT Cervical Spine Wo Contrast  Result Date: 06/20/2021 CLINICAL DATA:  Head trauma, minor (Age >= 65y); Neck trauma (Age >= 65y) EXAM: CT HEAD WITHOUT CONTRAST CT CERVICAL SPINE WITHOUT CONTRAST TECHNIQUE: Multidetector CT imaging of the head and cervical spine was performed following the standard protocol without intravenous contrast. Multiplanar CT image reconstructions of the cervical spine were also generated. COMPARISON:  Apr 21, 2020 FINDINGS: CT HEAD FINDINGS Brain: No evidence of acute infarction, hemorrhage, hydrocephalus, extra-axial collection or mass lesion/mass effect. Periventricular white matter hypodensities consistent with sequela of chronic microvascular ischemic disease. Remote RIGHT basal ganglia lacunar infarction. Encephalomalacia of the RIGHT occipital lobe consistent with remote prior infarction; degree of encephalomalacia is greater than that seen on diffusion weighted imaging from Apr 21, 2020. Advanced global parenchymal volume loss for age. Vascular: Vascular calcifications. Skull: Normal. Negative for fracture or focal lesion. Sinuses/Orbits: No acute finding. Other: None. CT CERVICAL SPINE FINDINGS Alignment: Normal. Skull base and vertebrae: No acute fracture. No primary bone lesion or focal pathologic process. Soft tissues and spinal canal: No prevertebral fluid or swelling. No visible canal hematoma. Disc levels: Mild intervertebral disc space height loss with posterior disc osteophyte complex at C4-5. Multilevel facet arthropathy without high-grade neural foraminal narrowing. Upper chest: Negative. Other: Atherosclerotic calcifications of the carotid bulbs. IMPRESSION: 1. No acute intracranial  abnormality. There is a remote RIGHT occipital infarction with corresponding encephalomalacia, increased since Apr 21, 2020. 2.  No acute fracture or static subluxation of the cervical spine. Electronically Signed   By: Valentino Saxon MD   On: 06/20/2021 19:08   DG Pelvis Portable  Result Date: 06/20/2021 CLINICAL DATA:  Fall.  Pain. EXAM: PORTABLE PELVIS 1-2 VIEWS COMPARISON:  None. FINDINGS: No fracture or bone lesion. Hip joints, SI joints and symphysis pubis are normally aligned. There are arterial  vascular calcifications. Soft tissues are otherwise unremarkable. IMPRESSION: 1. No fracture, dislocation or acute finding. Electronically Signed   By: Lajean Manes M.D.   On: 06/20/2021 16:01   DG Chest Port 1 View  Result Date: 06/20/2021 CLINICAL DATA:  Fall. EXAM: PORTABLE CHEST 1 VIEW COMPARISON:  03/14/2021 FINDINGS: Stable changes from prior CABG surgery. Cardiac silhouette is normal in size and configuration. No mediastinal or hilar masses. Clear lungs.  No pleural effusion or pneumothorax. Skeletal structures are grossly intact. IMPRESSION: No active disease. Electronically Signed   By: Lajean Manes M.D.   On: 06/20/2021 16:00   DG Humerus Left  Result Date: 06/20/2021 CLINICAL DATA:  Bruising and pain in upper arm EXAM: LEFT HUMERUS - 2+ VIEW COMPARISON:  None. FINDINGS: No fracture or bone lesion. Shoulder and elbow joints are normally aligned. Soft tissues are unremarkable. IMPRESSION: Negative. Electronically Signed   By: Lajean Manes M.D.   On: 06/20/2021 15:59      Scheduled Meds:  enoxaparin (LOVENOX) injection  30 mg Subcutaneous Q24H   insulin aspart  0-5 Units Subcutaneous QHS   insulin aspart  0-9 Units Subcutaneous TID WC   insulin glargine  50 Units Subcutaneous Daily   [START ON 06/22/2021] isosorbide mononitrate  120 mg Oral Daily   levothyroxine  25 mcg Intravenous Daily   metoprolol tartrate  5 mg Intravenous Q6H   [START ON 06/22/2021] ticagrelor  90 mg Oral BID    Continuous Infusions:  cefTRIAXone (ROCEPHIN)  IV     dextrose 5 % and 0.45% NaCl 75 mL/hr at 06/21/21 1224   lactated ringers 125 mL/hr at 06/21/21 0605     LOS: 0 days      Time spent: 25 minutes   Dessa Phi, DO [facility] 06/21/2021, 12:54 PM   Available via Epic secure chat 7am-7pm After these hours, please refer to coverage provider listed on [email or web address]

## 2021-06-21 NOTE — Progress Notes (Addendum)
Hypoglycemic Event  CBG: 65 @ 2156  Treatment: 4 oz juice/soda  Symptoms: Nervous/irritable  Follow-up CBG: Time: 228 CBG Result: 74  Possible Reasons for Event: Inadequate meal intake  Comments/MD notified: Rthore MD    Renee Rival

## 2021-06-21 NOTE — Progress Notes (Signed)
Inpatient Diabetes Program Recommendations  AACE/ADA: New Consensus Statement on Inpatient Glycemic Control  Target Ranges:  Prepandial:   less than 140 mg/dL      Peak postprandial:   less than 180 mg/dL (1-2 hours)      Critically ill patients:  140 - 180 mg/dL   Results for ZAHAVA, QUANT (MRN [id number]) as of 06/21/2021 09:51  Ref. Range 06/21/2021 00:16 06/21/2021 01:20 06/21/2021 02:20 06/21/2021 03:22 06/21/2021 04:28 06/21/2021 05:32  Glucose-Capillary Latest Ref Range: 70 - 99 mg/dL 99 107 (H) 135 (H) 160 (H) 183 (H) 177 (H)  Results for AIYLA, BAUCOM (MRN [id number]) as of 06/21/2021 09:51  Ref. Range 06/20/2021 15:04  Glucose Latest Ref Range: 70 - 99 mg/dL 914 (HH)    Review of Glycemic Control  Diabetes history: DM2 Outpatient Diabetes medications: Basaglar 45 units daily, Novolog per sliding scale, Metformin 3500 mg BID, Trulicity 1.5 mg Qweek Current orders for Inpatient glycemic control: Lantus 50 units daily, Novolog 0-9 units TID with meals, Novolog 0-5 units QHS   NOTE: Noted consult for Diabetes Coordinator. Diabetes Coordinator is not on campus over the weekend but available by pager from 8am to 5pm for questions or concerns. Chart reviewed. Per H&P, " brought to the ER after patient's daughter found that patient was getting increasingly confused over the last 3 days has been having frequent falls and blood sugar has been running high.  Patient daughter states that she was admitted a similar situation in March of this year at [facility] and patient had very high blood sugar eventually was discharged to rehab and after discharge patient's daughter has been living with her.  Initially she was very compliant with the medication but last few weeks he has been very resistant to taking medications.  Hemoglobin A1c last 1 was around 14 2 weeks ago as per the patient's daughter." Initial lab glucose 914 mg/dl on 06/20/21 and patient was ordered IV insulin which has been  transitioned to SQ insulin. Patient received Lantus 50 units at 6:09 am today. Will follow trends with current orders. No recommendations for changes with insulin regimen at this time. Diabetes coordinator will follow up with patient on Monday.  Thanks, Barnie Alderman, RN, MSN, CDE Diabetes Coordinator Inpatient Diabetes Program [phone] (Team Pager from 8am to 5pm)

## 2021-06-21 NOTE — Progress Notes (Signed)
Patient arrived from the ER on a stretcher, assessment completed see flow sheet, placed on tele ccmd notified, patient oriented to room and staff , bed in lowest position call bell within reach will continue to monitor.

## 2021-06-22 DIAGNOSIS — E1165 Type 2 diabetes mellitus with hyperglycemia: Secondary | ICD-10-CM | POA: Diagnosis not present

## 2021-06-22 DIAGNOSIS — E1100 Type 2 diabetes mellitus with hyperosmolarity without nonketotic hyperglycemic-hyperosmolar coma (NKHHC): Secondary | ICD-10-CM | POA: Diagnosis not present

## 2021-06-22 LAB — BASIC METABOLIC PANEL
Anion gap: 5 (ref 5–15)
BUN: 15 mg/dL (ref 8–23)
CO2: 24 mmol/L (ref 22–32)
Calcium: 9.8 mg/dL (ref 8.9–10.3)
Chloride: 113 mmol/L — ABNORMAL HIGH (ref 98–111)
Creatinine, Ser: 1.5 mg/dL — ABNORMAL HIGH (ref 0.44–1.00)
GFR, Estimated: 37 mL/min — ABNORMAL LOW (ref >60)
Glucose, Bld: 94 mg/dL (ref 70–99)
Potassium: 3.6 mmol/L (ref 3.5–5.1)
Sodium: 142 mmol/L (ref 135–145)

## 2021-06-22 LAB — CBC
HCT: 28.9 % — ABNORMAL LOW (ref 36.0–46.0)
Hemoglobin: 9.4 g/dL — ABNORMAL LOW (ref 12.0–15.0)
MCH: 28.3 pg (ref 26.0–34.0)
MCHC: 32.5 g/dL (ref 30.0–36.0)
MCV: 87 fL (ref 80.0–100.0)
Platelets: 185 10*3/uL (ref 150–400)
RBC: 3.32 MIL/uL — ABNORMAL LOW (ref 3.87–5.11)
RDW: 13.7 % (ref 11.5–15.5)
WBC: 10.3 10*3/uL (ref 4.0–10.5)
nRBC: 0 % (ref 0.0–0.2)

## 2021-06-22 LAB — GLUCOSE, CAPILLARY
Glucose-Capillary: 106 mg/dL — ABNORMAL HIGH (ref 70–99)
Glucose-Capillary: 116 mg/dL — ABNORMAL HIGH (ref 70–99)
Glucose-Capillary: 136 mg/dL — ABNORMAL HIGH (ref 70–99)
Glucose-Capillary: 273 mg/dL — ABNORMAL HIGH (ref 70–99)
Glucose-Capillary: 304 mg/dL — ABNORMAL HIGH (ref 70–99)
Glucose-Capillary: 55 mg/dL — ABNORMAL LOW (ref 70–99)
Glucose-Capillary: 78 mg/dL (ref 70–99)

## 2021-06-22 LAB — URINE CULTURE

## 2021-06-22 LAB — HEMOGLOBIN A1C
Hgb A1c MFr Bld: >15.5 % — ABNORMAL HIGH (ref 4.8–5.6)
Mean Plasma Glucose: >398 mg/dL

## 2021-06-22 MED ORDER — DEXTROSE-NACL 5-0.9 % IV SOLN: INTRAVENOUS | Status: DC

## 2021-06-22 MED ORDER — DEXTROSE 50 % IV SOLN: 12.5000 g | INTRAVENOUS | Status: DC

## 2021-06-22 MED ORDER — RENA-VITE PO TABS
1.0000 | ORAL_TABLET | Freq: Every day | ORAL | Status: DC
  Administered 2021-06-22 – 2021-06-29 (×8): 1 via ORAL
  Filled 2021-06-22 (×8): qty 1

## 2021-06-22 MED ORDER — INSULIN ASPART 100 UNIT/ML IJ SOLN
0.0000 [IU] | INTRAMUSCULAR | Status: DC
  Administered 2021-06-22: 5 [IU] via SUBCUTANEOUS
  Administered 2021-06-22: 1 [IU] via SUBCUTANEOUS
  Administered 2021-06-22: 7 [IU] via SUBCUTANEOUS
  Administered 2021-06-23: 3 [IU] via SUBCUTANEOUS
  Administered 2021-06-23: 1 [IU] via SUBCUTANEOUS

## 2021-06-22 NOTE — Progress Notes (Addendum)
Inpatient Diabetes Program Recommendations  AACE/ADA: New Consensus Statement on Inpatient Glycemic Control  Target Ranges:  Prepandial:   less than 140 mg/dL      Peak postprandial:   less than 180 mg/dL (1-2 hours)      Critically ill patients:  140 - 180 mg/dL    Results for Tonya Richard, Tonya Richard (MRN [id number]) as of 06/22/2021 09:23  Ref. Range 06/21/2021 04:28 06/21/2021 05:32 06/21/2021 12:12 06/21/2021 16:23 06/21/2021 17:16 06/21/2021 21:56 06/21/2021 22:28 06/22/2021 00:54 06/22/2021 05:08 06/22/2021 05:34 06/22/2021 06:05  Glucose-Capillary Latest Ref Range: 70 - 99 mg/dL 183 (H) 177 (H) 90 69 (L) 102 (H) 65 (L) 74 116 (H) 55 (L) 106 (H) 136 (H)    Review of Glycemic Control  Diabetes history: DM2 Outpatient Diabetes medications: Basaglar 45 units daily, Novolog per sliding scale, Metformin 4388 mg BID, Trulicity 1.5 mg Qweek Current orders for Inpatient glycemic control:  Novolog 0-9 units TID with meals + hs D5 NS at 60 ml/hour  Mild hypoglycemia on L[place] 50 units.   Lantus currently not ordered When glucose trends increase above 180 consider:  -   Adding insulin Glargine/Lantus 35-40 units Daily (home dose 45 units).  Addendum 1141 am: Rounded on pt today to attempt to speak about A1c ad taking insulin at home. Pt in soft mittens and sleeping. Will attempt to see at a later date if available.  Thanks, Tama Headings RN, MSN, BC-ADM Inpatient Diabetes Coordinator Team Pager [phone] (8a-5p)

## 2021-06-22 NOTE — Progress Notes (Signed)
PROGRESS NOTE    Tonya Richard  OHY:[id number] DOB: September 18, 1946 DOA: 06/20/2021 PCP: Penelope Coop, FNP   Brief Narrative:   Tonya Richard is a 75 y.o. female with history of diabetes mellitus type 2, CAD status post CABG, peripheral vascular disease, chronic kidney disease stage IV, chronic anemia was brought to the ER after patient's daughter found that patient was getting increasingly confused over the last 3 days has been having frequent falls and blood sugar has been running high.  Patient daughter states that she was admitted a similar situation in March of this year at [facility] and patient had very high blood sugar eventually was discharged to rehab and after discharge patient's daughter has been living with her.  Initially she was very compliant with the medication but last few weeks she has been very resistant to taking medications.  Hemoglobin A1c last 1 was around 14 2 weeks ago as per the patient's daughter.  Patient was found to have HHS, started on IV insulin.  -She remains confused and minimally responsive this morning with noted hypoglycemia overnight.  Assessment & Plan:   Active Problems:   Hypothyroidism   Essential hypertension, benign   COPD (chronic obstructive pulmonary disease) (HCC)   Hx of CABG   Hyperosmolar hyperglycemic state (HHS) ([place])   Acute delirium   Hyperosmolar non-ketotic state due to type 2 diabetes mellitus ([place])   HHS, now with hypoglycemia -Started on D5 IV fluid to assist with recurrent hypoglycemia -Appreciate diabetes coordinator recommendations -Continue minimal dose sliding scale insulin, Lantus discontinued -Hemoglobin A1c noted to be greater than 15.5% on 7/23, she will require endocrinology referral once stable enough for discharge -Daughter states she's very noncompliant with diet and medication intake  Acute metabolic encephalopathy -Thought to be secondary to above -CT head without acute intracranial abnormality,  there is a remote right occipital infarction with corresponding encephalomalacia -Not back to baseline   Coronary artery disease status post CABG -Continue Brilinta, Crestor (resume when patient able to take p.o.), metoprolol (changed to IV in setting of decreased p.o.), Imdur   UTI -UA showed moderate leukocytes and rare bacteria.  She was started on empiric Rocephin -Urine culture with multiple species noted, will recollect  CKD stage IV -Stable  Hypothyroidism -Continue Synthroid -TSH 3.81 and T4 1.03   DVT prophylaxis:Lovenox Code Status: DNR Family Communication: Discussed with daughter on phone 7/25 Disposition Plan:  Status is: Inpatient  Remains inpatient appropriate because:Altered mental status, IV treatments appropriate due to intensity of illness or inability to take PO, and Inpatient level of care appropriate due to severity of illness  Dispo: The patient is from: Home              Anticipated d/c is to: Home              Patient currently is not medically stable to d/c.   Difficult to place patient No   Consultants:  None  Procedures:  See below  Antimicrobials:  Anti-infectives (From admission, onward)    Start     Dose/Rate Route Frequency Ordered Stop   06/21/21 2000  cefTRIAXone (ROCEPHIN) 1 g in sodium chloride 0.9 % 100 mL IVPB        1 g 200 mL/hr over 30 Minutes Intravenous Every 24 hours 06/20/21 2106     06/20/21 1915  cefTRIAXone (ROCEPHIN) 1 g in sodium chloride 0.9 % 100 mL IVPB        1 g 200 mL/hr over 30  Minutes Intravenous  Once 06/20/21 1903 06/20/21 2127       Subjective: Patient seen and evaluated today she was noted to have episodes of hypoglycemia overnight and had received D50.  Long-acting insulin was discontinued.  She remains mostly unresponsive this morning.  Objective: Vitals:   06/22/21 0015 06/22/21 0105 06/22/21 0536 06/22/21 1000  BP: (!) 181/95 (!) 159/95 (!) 178/68 (!) 178/77  Pulse:    91  Resp: (!) 21 19 (!)  21 12  Temp: 97.6 F (36.4 C)  97.9 F (36.6 C) 97.9 F (36.6 C)  TempSrc: Axillary  Axillary Axillary  SpO2: 100% 98% 99% 98%  Weight:      Height:        Intake/Output Summary (Last 24 hours) at 06/22/2021 1405 Last data filed at 06/21/2021 1500 Gross per 24 hour  Intake 1284.98 ml  Output --  Net 1284.98 ml   Filed Weights   06/20/21 1501 06/20/21 2348  Weight: 66.2 kg 65.8 kg    Examination:  General exam: Appears somnolent/confused Respiratory system: Clear to auscultation. Respiratory effort normal. Cardiovascular system: S1 & S2 heard, RRR.  Gastrointestinal system: Abdomen is soft Central nervous system: Somnolent Extremities: No edema Skin: No significant lesions noted Psychiatry: Cannot be assessed    Data Reviewed: I have personally reviewed following labs and imaging studies  CBC: Recent Labs  Lab 06/20/21 1504 06/20/21 1702 06/20/21 2140 06/22/21 0126  WBC 9.0  --  9.3 10.3  HGB 9.8* 8.8* 9.5* 9.4*  HCT 30.2* 26.0* 29.2* 28.9*  MCV 87.3  --  88.2 87.0  PLT 185  --  179 063   Basic Metabolic Panel: Recent Labs  Lab 06/20/21 1504 06/20/21 1650 06/20/21 1702 06/20/21 2140 06/21/21 0156 06/21/21 0619 06/22/21 0126  NA 122* 126* 128* 133* 138 139 142  K 4.0 3.9 3.8 3.5 3.5 3.5 3.6  CL 91* 99  --  106 109 109 113*  CO2 19* 17*  --  18* 21* 21* 24  GLUCOSE 914* 776*  --  368* 120* 194* 94  BUN 37* 36*  --  32* 28* 27* 15  CREATININE 2.34* 2.05*  --  1.95* 1.77* 1.71* 1.50*  CALCIUM 9.5 8.9  --  9.3 9.6 9.7 9.8  MG 2.3  --   --   --   --   --   --    GFR: Estimated Creatinine Clearance: 29 mL/min (A) (by C-G formula based on SCr of 1.5 mg/dL (H)). Liver Function Tests: Recent Labs  Lab 06/20/21 1504  AST 13*  ALT 24  ALKPHOS 169*  BILITOT 0.8  PROT 6.1*  ALBUMIN 3.2*   No results for input(s): LIPASE, AMYLASE in the last 168 hours. No results for input(s): AMMONIA in the last 168 hours. Coagulation Profile: No results for  input(s): INR, PROTIME in the last 168 hours. Cardiac Enzymes: No results for input(s): CKTOTAL, CKMB, CKMBINDEX, TROPONINI in the last 168 hours. BNP (last 3 results) No results for input(s): PROBNP in the last 8760 hours. HbA1C: Recent Labs    06/20/21 1621  HGBA1C >15.5*   CBG: Recent Labs  Lab 06/22/21 0054 06/22/21 0508 06/22/21 0534 06/22/21 0605 06/22/21 1109  GLUCAP 116* 55* 106* 136* 78   Lipid Profile: No results for input(s): CHOL, HDL, LDLCALC, TRIG, CHOLHDL, LDLDIRECT in the last 72 hours. Thyroid Function Tests: Recent Labs    06/20/21 2140  TSH 3.881  FREET4 1.03   Anemia Panel: No results for input(s): VITAMINB12, FOLATE,  FERRITIN, TIBC, IRON, RETICCTPCT in the last 72 hours. Sepsis Labs: No results for input(s): PROCALCITON, LATICACIDVEN in the last 168 hours.  Recent Results (from the past 240 hour(s))  Resp Panel by RT-PCR (Flu A&B, Covid) Nasopharyngeal Swab     Status: None   Collection Time: 06/20/21  4:29 PM   Specimen: Nasopharyngeal Swab; Nasopharyngeal(NP) swabs in vial transport medium  Result Value Ref Range Status   SARS Coronavirus 2 by RT PCR NEGATIVE NEGATIVE Final    Comment: (NOTE) SARS-CoV-2 target nucleic acids are NOT DETECTED.  The SARS-CoV-2 RNA is generally detectable in upper respiratory specimens during the acute phase of infection. The lowest concentration of SARS-CoV-2 viral copies this assay can detect is 138 copies/mL. A negative result does not preclude SARS-Cov-2 infection and should not be used as the sole basis for treatment or other patient management decisions. A negative result may occur with  improper specimen collection/handling, submission of specimen other than nasopharyngeal swab, presence of viral mutation(s) within the areas targeted by this assay, and inadequate number of viral copies(<138 copies/mL). A negative result must be combined with clinical observations, patient history, and  epidemiological information. The expected result is Negative.  Fact Sheet for Patients:  EntrepreneurPulse.com.au  Fact Sheet for Healthcare Providers:  IncredibleEmployment.be  This test is no t yet approved or cleared by the [place] FDA and  has been authorized for detection and/or diagnosis of SARS-CoV-2 by FDA under an Emergency Use Authorization (EUA). This EUA will remain  in effect (meaning this test can be used) for the duration of the COVID-19 declaration under Section 564(b)(1) of the Act, 21 U.S.C.section 360bbb-3(b)(1), unless the authorization is terminated  or revoked sooner.       Influenza A by PCR NEGATIVE NEGATIVE Final   Influenza B by PCR NEGATIVE NEGATIVE Final    Comment: (NOTE) The Xpert Xpress SARS-CoV-2/FLU/RSV plus assay is intended as an aid in the diagnosis of influenza from Nasopharyngeal swab specimens and should not be used as a sole basis for treatment. Nasal washings and aspirates are unacceptable for Xpert Xpress SARS-CoV-2/FLU/RSV testing.  Fact Sheet for Patients: EntrepreneurPulse.com.au  Fact Sheet for Healthcare Providers: IncredibleEmployment.be  This test is not yet approved or cleared by the [place] FDA and has been authorized for detection and/or diagnosis of SARS-CoV-2 by FDA under an Emergency Use Authorization (EUA). This EUA will remain in effect (meaning this test can be used) for the duration of the COVID-19 declaration under Section 564(b)(1) of the Act, 21 U.S.C. section 360bbb-3(b)(1), unless the authorization is terminated or revoked.  Performed at [facility], [place] [address], [place], [place] [address]   Urine Culture     Status: Abnormal   Collection Time: 06/20/21  7:04 PM   Specimen: Urine, Clean Catch  Result Value Ref Range Status   Specimen Description URINE, CLEAN CATCH  Final   Special Requests   Final     NONE Performed at [facility], [place] [address], [place], [place] [address]    Culture MULTIPLE SPECIES PRESENT, SUGGEST RECOLLECTION (A)  Final   Report Status 06/22/2021 FINAL  Final         Radiology Studies: CT Head Wo Contrast  Result Date: 06/20/2021 CLINICAL DATA:  Head trauma, minor (Age >= 65y); Neck trauma (Age >= 65y) EXAM: CT HEAD WITHOUT CONTRAST CT CERVICAL SPINE WITHOUT CONTRAST TECHNIQUE: Multidetector CT imaging of the head and cervical spine was performed following the standard protocol without intravenous contrast.  Multiplanar CT image reconstructions of the cervical spine were also generated. COMPARISON:  Apr 21, 2020 FINDINGS: CT HEAD FINDINGS Brain: No evidence of acute infarction, hemorrhage, hydrocephalus, extra-axial collection or mass lesion/mass effect. Periventricular white matter hypodensities consistent with sequela of chronic microvascular ischemic disease. Remote RIGHT basal ganglia lacunar infarction. Encephalomalacia of the RIGHT occipital lobe consistent with remote prior infarction; degree of encephalomalacia is greater than that seen on diffusion weighted imaging from Apr 21, 2020. Advanced global parenchymal volume loss for age. Vascular: Vascular calcifications. Skull: Normal. Negative for fracture or focal lesion. Sinuses/Orbits: No acute finding. Other: None. CT CERVICAL SPINE FINDINGS Alignment: Normal. Skull base and vertebrae: No acute fracture. No primary bone lesion or focal pathologic process. Soft tissues and spinal canal: No prevertebral fluid or swelling. No visible canal hematoma. Disc levels: Mild intervertebral disc space height loss with posterior disc osteophyte complex at C4-5. Multilevel facet arthropathy without high-grade neural foraminal narrowing. Upper chest: Negative. Other: Atherosclerotic calcifications of the carotid bulbs. IMPRESSION: 1. No acute intracranial abnormality. There is a remote RIGHT occipital infarction with  corresponding encephalomalacia, increased since Apr 21, 2020. 2.  No acute fracture or static subluxation of the cervical spine. Electronically Signed   By: Valentino Saxon MD   On: 06/20/2021 19:08   CT Cervical Spine Wo Contrast  Result Date: 06/20/2021 CLINICAL DATA:  Head trauma, minor (Age >= 65y); Neck trauma (Age >= 65y) EXAM: CT HEAD WITHOUT CONTRAST CT CERVICAL SPINE WITHOUT CONTRAST TECHNIQUE: Multidetector CT imaging of the head and cervical spine was performed following the standard protocol without intravenous contrast. Multiplanar CT image reconstructions of the cervical spine were also generated. COMPARISON:  Apr 21, 2020 FINDINGS: CT HEAD FINDINGS Brain: No evidence of acute infarction, hemorrhage, hydrocephalus, extra-axial collection or mass lesion/mass effect. Periventricular white matter hypodensities consistent with sequela of chronic microvascular ischemic disease. Remote RIGHT basal ganglia lacunar infarction. Encephalomalacia of the RIGHT occipital lobe consistent with remote prior infarction; degree of encephalomalacia is greater than that seen on diffusion weighted imaging from Apr 21, 2020. Advanced global parenchymal volume loss for age. Vascular: Vascular calcifications. Skull: Normal. Negative for fracture or focal lesion. Sinuses/Orbits: No acute finding. Other: None. CT CERVICAL SPINE FINDINGS Alignment: Normal. Skull base and vertebrae: No acute fracture. No primary bone lesion or focal pathologic process. Soft tissues and spinal canal: No prevertebral fluid or swelling. No visible canal hematoma. Disc levels: Mild intervertebral disc space height loss with posterior disc osteophyte complex at C4-5. Multilevel facet arthropathy without high-grade neural foraminal narrowing. Upper chest: Negative. Other: Atherosclerotic calcifications of the carotid bulbs. IMPRESSION: 1. No acute intracranial abnormality. There is a remote RIGHT occipital infarction with corresponding  encephalomalacia, increased since Apr 21, 2020. 2.  No acute fracture or static subluxation of the cervical spine. Electronically Signed   By: Valentino Saxon MD   On: 06/20/2021 19:08   DG Pelvis Portable  Result Date: 06/20/2021 CLINICAL DATA:  Fall.  Pain. EXAM: PORTABLE PELVIS 1-2 VIEWS COMPARISON:  None. FINDINGS: No fracture or bone lesion. Hip joints, SI joints and symphysis pubis are normally aligned. There are arterial vascular calcifications. Soft tissues are otherwise unremarkable. IMPRESSION: 1. No fracture, dislocation or acute finding. Electronically Signed   By: Lajean Manes M.D.   On: 06/20/2021 16:01   DG Chest Port 1 View  Result Date: 06/20/2021 CLINICAL DATA:  Fall. EXAM: PORTABLE CHEST 1 VIEW COMPARISON:  03/14/2021 FINDINGS: Stable changes from prior CABG surgery. Cardiac silhouette is normal in size and  configuration. No mediastinal or hilar masses. Clear lungs.  No pleural effusion or pneumothorax. Skeletal structures are grossly intact. IMPRESSION: No active disease. Electronically Signed   By: Lajean Manes M.D.   On: 06/20/2021 16:00   DG Humerus Left  Result Date: 06/20/2021 CLINICAL DATA:  Bruising and pain in upper arm EXAM: LEFT HUMERUS - 2+ VIEW COMPARISON:  None. FINDINGS: No fracture or bone lesion. Shoulder and elbow joints are normally aligned. Soft tissues are unremarkable. IMPRESSION: Negative. Electronically Signed   By: Lajean Manes M.D.   On: 06/20/2021 15:59        Scheduled Meds:  dextrose  12.5 g Intravenous STAT   enoxaparin (LOVENOX) injection  30 mg Subcutaneous Q24H   insulin aspart  0-9 Units Subcutaneous Q4H   isosorbide mononitrate  120 mg Oral Daily   levothyroxine  25 mcg Intravenous Daily   metoprolol tartrate  5 mg Intravenous Q6H   ticagrelor  90 mg Oral BID   Continuous Infusions:  cefTRIAXone (ROCEPHIN)  IV 1 g (06/21/21 2020)   dextrose 5 % and 0.9% NaCl 60 mL/hr at 06/22/21 0944     LOS: 1 day    Time spent: 35  minutes    Laquita Harlan D Manuella Ghazi, DO [facility]  If 7PM-7AM, please contact night-coverage [email or web address] 06/22/2021, 2:05 PM

## 2021-06-22 NOTE — Progress Notes (Signed)
Initial Nutrition Assessment  DOCUMENTATION CODES:  Non-severe (moderate) malnutrition in context of chronic illness  INTERVENTION:  Add Vital Cuisine Shake BID, each supplement provides 520 kcal and 22 grams of protein.  Add Magic cup TID with meals, each supplement provides 290 kcal and 9 grams of protein.  Add Rena-Vite daily.  Feeding assistance with meals.  NUTRITION DIAGNOSIS:  Moderate Malnutrition related to chronic illness (CKD4, T2DM) as evidenced by mild fat depletion, mild muscle depletion, moderate muscle depletion.  GOAL:  Patient will meet greater than or equal to 90% of their needs  MONITOR:  PO intake, Supplement acceptance, Labs, Weight trends, I & O's  REASON FOR ASSESSMENT:  Consult Assessment of nutrition requirement/status, Poor PO  ASSESSMENT:  75 yo female with a PMH of T2DM, CAD s/p CABG, peripheral vascular disease, CKD stage 4, and chronic anemia who presents with HHS (now resolved) and AMS.  Attempted to speak with pt at bedside. Pt very lethargic. Did not awaken to name or touch.  Spoke with RN. RN reports pt is now hypoglycemic and on D5. She reported that pt has a daughter that she lives with but daughter has not been by to see pt. She also spits out food she does not like and yells when she is moved. RN noted that pt ate "junk" food at home and not enough to sustain her needs.  Pt's weight has remained stable for the past 9 months per Epic.  On exam, pt with some mild depletions.  Recommend adding Hormel shakes BID and Magic Cup TID, as well as Rena-Vite to aid in caloric and protein intake.  Medications: reviewed; SSI, Synthroid, ceftriaxone via IV, D5 in NaCl @ 60 ml/hr, Haldol PRN (given once today), Ativan PRN (given once today)  Labs: reviewed; CBG 55-136  NUTRITION - FOCUSED PHYSICAL EXAM: Flowsheet Row Most Recent Value  Orbital Region Mild depletion  Upper Arm Region No depletion  Thoracic and Lumbar Region No depletion  Buccal  Region Mild depletion  Temple Region Mild depletion  Clavicle Bone Region No depletion  Clavicle and Acromion Bone Region No depletion  Scapular Bone Region Unable to assess  Dorsal Hand Unable to assess  [[place]]  Patellar Region Moderate depletion  Anterior Thigh Region Moderate depletion  Posterior Calf Region Moderate depletion  Edema (RD Assessment) None  Hair Reviewed  Eyes Unable to assess  Mouth Unable to assess  Skin Reviewed  Nails Unable to assess  [[place]]   Diet Order:   Diet Order             Diet heart healthy/carb modified Room service appropriate? Yes; Fluid consistency: Thin  Diet effective now                  EDUCATION NEEDS:  Not appropriate for education at this time  Skin:  Skin Assessment: Reviewed RN Assessment  Last BM:  06/21/21  Height:  Ht Readings from Last 1 Encounters:  06/20/21 5\' 1"  (1.549 m)   Weight:  Wt Readings from Last 1 Encounters:  06/20/21 65.8 kg   BMI:  Body mass index is 27.41 kg/m.  Estimated Nutritional Needs:  Kcal:  5409-8119 Protein:  85-100 grams Fluid:  >1.75 L  Derrel Nip, RD, LDN (she/her/hers) Registered Dietitian I After-Hours/Weekend Pager # in [place]

## 2021-06-22 NOTE — Progress Notes (Signed)
Hypoglycemic Event  CBG: 55 @ 0504  Treatment: 8 oz juice/soda  Symptoms: Nervous/irritable  Follow-up CBG: Time:0534  CBG Result:105  Possible Reasons for Event: inadequate meal intake, medication regimen  Comments/MD notified: [place]

## 2021-06-23 DIAGNOSIS — E1301 Other specified diabetes mellitus with hyperosmolarity with coma: Secondary | ICD-10-CM | POA: Diagnosis not present

## 2021-06-23 DIAGNOSIS — E440 Moderate protein-calorie malnutrition: Secondary | ICD-10-CM | POA: Insufficient documentation

## 2021-06-23 DIAGNOSIS — R410 Disorientation, unspecified: Secondary | ICD-10-CM | POA: Diagnosis not present

## 2021-06-23 DIAGNOSIS — I10 Essential (primary) hypertension: Secondary | ICD-10-CM | POA: Diagnosis not present

## 2021-06-23 HISTORY — DX: Moderate protein-calorie malnutrition: E44.0

## 2021-06-23 LAB — GLUCOSE, CAPILLARY
Glucose-Capillary: 127 mg/dL — ABNORMAL HIGH (ref 70–99)
Glucose-Capillary: 228 mg/dL — ABNORMAL HIGH (ref 70–99)
Glucose-Capillary: 407 mg/dL — ABNORMAL HIGH (ref 70–99)
Glucose-Capillary: 431 mg/dL — ABNORMAL HIGH (ref 70–99)
Glucose-Capillary: 457 mg/dL — ABNORMAL HIGH (ref 70–99)
Glucose-Capillary: 523 mg/dL (ref 70–99)
Glucose-Capillary: 563 mg/dL (ref 70–99)
Glucose-Capillary: 577 mg/dL (ref 70–99)
Glucose-Capillary: 97 mg/dL (ref 70–99)

## 2021-06-23 LAB — BASIC METABOLIC PANEL
Anion gap: 6 (ref 5–15)
BUN: 13 mg/dL (ref 8–23)
CO2: 20 mmol/L — ABNORMAL LOW (ref 22–32)
Calcium: 9.1 mg/dL (ref 8.9–10.3)
Chloride: 115 mmol/L — ABNORMAL HIGH (ref 98–111)
Creatinine, Ser: 1.51 mg/dL — ABNORMAL HIGH (ref 0.44–1.00)
GFR, Estimated: 36 mL/min — ABNORMAL LOW (ref >60)
Glucose, Bld: 205 mg/dL — ABNORMAL HIGH (ref 70–99)
Potassium: 3.4 mmol/L — ABNORMAL LOW (ref 3.5–5.1)
Sodium: 141 mmol/L (ref 135–145)

## 2021-06-23 LAB — CBC
HCT: 25.9 % — ABNORMAL LOW (ref 36.0–46.0)
Hemoglobin: 8.3 g/dL — ABNORMAL LOW (ref 12.0–15.0)
MCH: 28.3 pg (ref 26.0–34.0)
MCHC: 32 g/dL (ref 30.0–36.0)
MCV: 88.4 fL (ref 80.0–100.0)
Platelets: 157 10*3/uL (ref 150–400)
RBC: 2.93 MIL/uL — ABNORMAL LOW (ref 3.87–5.11)
RDW: 14.2 % (ref 11.5–15.5)
WBC: 6.7 10*3/uL (ref 4.0–10.5)
nRBC: 0 % (ref 0.0–0.2)

## 2021-06-23 LAB — GLUCOSE, RANDOM
Glucose, Bld: 517 mg/dL (ref 70–99)
Glucose, Bld: 573 mg/dL (ref 70–99)
Glucose, Bld: 564 mg/dL (ref 70–99)

## 2021-06-23 LAB — HEMOGLOBIN A1C
Hgb A1c MFr Bld: >15.5 % — ABNORMAL HIGH (ref 4.8–5.6)
Mean Plasma Glucose: >398 mg/dL

## 2021-06-23 LAB — MAGNESIUM: Magnesium: 1.9 mg/dL (ref 1.7–2.4)

## 2021-06-23 MED ORDER — INSULIN ASPART 100 UNIT/ML IJ SOLN
10.0000 [IU] | Freq: Three times a day (TID) | INTRAMUSCULAR | Status: DC
  Administered 2021-06-23 – 2021-06-24 (×3): 10 [IU] via SUBCUTANEOUS

## 2021-06-23 MED ORDER — INSULIN GLARGINE-YFGN 100 UNIT/ML ~~LOC~~ SOLN
25.0000 [IU] | Freq: Every day | SUBCUTANEOUS | Status: DC
  Administered 2021-06-23 – 2021-06-24 (×2): 25 [IU] via SUBCUTANEOUS
  Filled 2021-06-23 (×2): qty 0.25

## 2021-06-23 MED ORDER — INSULIN ASPART 100 UNIT/ML IJ SOLN: 5.0000 [IU] | Freq: Three times a day (TID) | INTRAMUSCULAR | Status: DC

## 2021-06-23 MED ORDER — INSULIN ASPART 100 UNIT/ML IJ SOLN
0.0000 [IU] | Freq: Every day | INTRAMUSCULAR | Status: DC
  Administered 2021-06-23: 5 [IU] via SUBCUTANEOUS

## 2021-06-23 MED ORDER — QUETIAPINE FUMARATE 25 MG PO TABS
50.0000 mg | ORAL_TABLET | Freq: Every day | ORAL | Status: DC
  Administered 2021-06-23 – 2021-06-29 (×7): 50 mg via ORAL
  Filled 2021-06-23 (×7): qty 2

## 2021-06-23 MED ORDER — INSULIN ASPART 100 UNIT/ML IJ SOLN: 10.0000 [IU] | Freq: Three times a day (TID) | INTRAMUSCULAR | Status: DC

## 2021-06-23 MED ORDER — DICLOFENAC SODIUM 1 % EX GEL
2.0000 g | Freq: Four times a day (QID) | CUTANEOUS | Status: DC
  Administered 2021-06-23 – 2021-06-30 (×28): 2 g via TOPICAL
  Filled 2021-06-23: qty 100

## 2021-06-23 MED ORDER — POTASSIUM CHLORIDE CRYS ER 20 MEQ PO TBCR
40.0000 meq | EXTENDED_RELEASE_TABLET | Freq: Once | ORAL | Status: AC
  Administered 2021-06-23: 40 meq via ORAL
  Filled 2021-06-23: qty 2

## 2021-06-23 MED ORDER — IPRATROPIUM-ALBUTEROL 0.5-2.5 (3) MG/3ML IN SOLN: 3.0000 mL | Freq: Four times a day (QID) | RESPIRATORY_TRACT | Status: DC | PRN

## 2021-06-23 MED ORDER — INSULIN ASPART 100 UNIT/ML IJ SOLN
10.0000 [IU] | Freq: Once | INTRAMUSCULAR | Status: AC
  Administered 2021-06-23: 10 [IU] via SUBCUTANEOUS

## 2021-06-23 MED ORDER — INSULIN ASPART 100 UNIT/ML IJ SOLN
0.0000 [IU] | Freq: Three times a day (TID) | INTRAMUSCULAR | Status: DC
  Administered 2021-06-23 (×2): 15 [IU] via SUBCUTANEOUS
  Administered 2021-06-24: 8 [IU] via SUBCUTANEOUS
  Administered 2021-06-24: 15 [IU] via SUBCUTANEOUS

## 2021-06-23 NOTE — Progress Notes (Signed)
PROGRESS NOTE    Tonya Richard  DXI:[id number] DOB: 08/04/1946 DOA: 06/20/2021 PCP: Penelope Coop, FNP   Brief Narrative:   Tonya Richard is a 75 y.o. female with history of diabetes mellitus type 2, CAD status post CABG, peripheral vascular disease, chronic kidney disease stage IV, chronic anemia was brought to the ER after patient's daughter found that patient was getting increasingly confused over the last 3 days has been having frequent falls and blood sugar has been running high.  Patient daughter states that she was admitted a similar situation in March of this year at [facility] and patient had very high blood sugar eventually was discharged to rehab and after discharge patient's daughter has been living with her.  Initially she was very compliant with the medication but last few weeks she has been very resistant to taking medications.  Hemoglobin A1c last 1 was around 14 2 weeks ago as per the patient's daughter.  Patient was found to have HHS, started on IV insulin.   Assessment & Plan:   Active Problems:   Hypothyroidism   Essential hypertension, benign   COPD (chronic obstructive pulmonary disease) (HCC)   Hx of CABG   Hyperosmolar hyperglycemic state (HHS) ([place])   Acute delirium   Hyperosmolar non-ketotic state due to type 2 diabetes mellitus (HCC)   Malnutrition of moderate degree   HHS, and periods of hypoglycemia -Appreciate diabetes coordinator recommendations -add long acting and SSI -Hemoglobin A1c noted to be greater than 15.5% on 7/23, she will require endocrinology referral once stable enough for discharge -Daughter states she's very noncompliant with diet and medication intake  Acute metabolic encephalopathy -Thought to be secondary to above -CT head without acute intracranial abnormality, there is a remote right occipital infarction with corresponding encephalomalacia -? What her baseline is   Coronary artery disease status post CABG -Continue  Brilinta, Crestor (resume when patient able to take p.o.), metoprolol (changed to IV in setting of decreased p.o.), Imdur   UTI -UA showed moderate leukocytes and rare bacteria.  She was started on empiric Rocephin -Urine culture with multiple species noted, will recollect  CKD stage IV -Stable  Hypothyroidism -Continue Synthroid -TSH 3.81 and T4 1.03   DVT prophylaxis:Lovenox Code Status: DNR Disposition Plan:  Status is: Inpatient  Remains inpatient appropriate because:Altered mental status, IV treatments appropriate due to intensity of illness or inability to take PO, and Inpatient level of care appropriate due to severity of illness  Dispo: The patient is from: Home              Anticipated d/c is to: sNF?              Patient currently is not medically stable to d/c.   Difficult to place patient No   Consultants:  None    Antimicrobials:  Anti-infectives (From admission, onward)    Start     Dose/Rate Route Frequency Ordered Stop   06/21/21 2000  cefTRIAXone (ROCEPHIN) 1 g in sodium chloride 0.9 % 100 mL IVPB        1 g 200 mL/hr over 30 Minutes Intravenous Every 24 hours 06/20/21 2106     06/20/21 1915  cefTRIAXone (ROCEPHIN) 1 g in sodium chloride 0.9 % 100 mL IVPB        1 g 200 mL/hr over 30 Minutes Intravenous  Once 06/20/21 1903 06/20/21 2127       Subjective: C/o left shoulder pain  Objective: Vitals:   06/23/21 1151 06/23/21 1233  06/23/21 1253 06/23/21 1258  BP: (!) 132/91     Pulse: 100 (!) 113 (!) 110   Resp: 20   20  Temp: 97.6 F (36.4 C)     TempSrc: Oral     SpO2: 99% 98%    Weight:      Height:        Intake/Output Summary (Last 24 hours) at 06/23/2021 1349 Last data filed at 06/22/2021 1855 Gross per 24 hour  Intake 813.07 ml  Output --  Net 813.07 ml   Filed Weights   06/20/21 1501 06/20/21 2348  Weight: 66.2 kg 65.8 kg    Examination:  General: Appearance:     Overweight female in no acute distress- tearful at times      Lungs:     respirations unlabored  Heart:    Tachycardic. Normal rhythm. No murmurs, rubs, or gallops.   MS:   All extremities are intact. Large bruise on left shoulder/bicep   Neurologic:   Awake, cooperative, tearful       Data Reviewed: I have personally reviewed following labs and imaging studies  CBC: Recent Labs  Lab 06/20/21 1504 06/20/21 1702 06/20/21 2140 06/22/21 0126 06/23/21 0150  WBC 9.0  --  9.3 10.3 6.7  HGB 9.8* 8.8* 9.5* 9.4* 8.3*  HCT 30.2* 26.0* 29.2* 28.9* 25.9*  MCV 87.3  --  88.2 87.0 88.4  PLT 185  --  179 185 347   Basic Metabolic Panel: Recent Labs  Lab 06/20/21 1504 06/20/21 1650 06/20/21 2140 06/21/21 0156 06/21/21 0619 06/22/21 0126 06/23/21 0150  NA 122*   < > 133* 138 139 142 141  K 4.0   < > 3.5 3.5 3.5 3.6 3.4*  CL 91*   < > 106 109 109 113* 115*  CO2 19*   < > 18* 21* 21* 24 20*  GLUCOSE 914*   < > 368* 120* 194* 94 205*  BUN 37*   < > 32* 28* 27* 15 13  CREATININE 2.34*   < > 1.95* 1.77* 1.71* 1.50* 1.51*  CALCIUM 9.5   < > 9.3 9.6 9.7 9.8 9.1  MG 2.3  --   --   --   --   --  1.9   < > = values in this interval not displayed.   GFR: Estimated Creatinine Clearance: 28.8 mL/min (A) (by C-G formula based on SCr of 1.51 mg/dL (H)). Liver Function Tests: Recent Labs  Lab 06/20/21 1504  AST 13*  ALT 24  ALKPHOS 169*  BILITOT 0.8  PROT 6.1*  ALBUMIN 3.2*   No results for input(s): LIPASE, AMYLASE in the last 168 hours. No results for input(s): AMMONIA in the last 168 hours. Coagulation Profile: No results for input(s): INR, PROTIME in the last 168 hours. Cardiac Enzymes: No results for input(s): CKTOTAL, CKMB, CKMBINDEX, TROPONINI in the last 168 hours. BNP (last 3 results) No results for input(s): PROBNP in the last 8760 hours. HbA1C: Recent Labs    06/20/21 1621 06/20/21 2140  HGBA1C >15.5* >15.5*   CBG: Recent Labs  Lab 06/22/21 2047 06/23/21 0034 06/23/21 0423 06/23/21 0815 06/23/21 1153  GLUCAP 304*  228* 97 127* 431*   Lipid Profile: No results for input(s): CHOL, HDL, LDLCALC, TRIG, CHOLHDL, LDLDIRECT in the last 72 hours. Thyroid Function Tests: Recent Labs    06/20/21 2140  TSH 3.881  FREET4 1.03   Anemia Panel: No results for input(s): VITAMINB12, FOLATE, FERRITIN, TIBC, IRON, RETICCTPCT in the last 72 hours.  Sepsis Labs: No results for input(s): PROCALCITON, LATICACIDVEN in the last 168 hours.  Recent Results (from the past 240 hour(s))  Resp Panel by RT-PCR (Flu A&B, Covid) Nasopharyngeal Swab     Status: None   Collection Time: 06/20/21  4:29 PM   Specimen: Nasopharyngeal Swab; Nasopharyngeal(NP) swabs in vial transport medium  Result Value Ref Range Status   SARS Coronavirus 2 by RT PCR NEGATIVE NEGATIVE Final    Comment: (NOTE) SARS-CoV-2 target nucleic acids are NOT DETECTED.  The SARS-CoV-2 RNA is generally detectable in upper respiratory specimens during the acute phase of infection. The lowest concentration of SARS-CoV-2 viral copies this assay can detect is 138 copies/mL. A negative result does not preclude SARS-Cov-2 infection and should not be used as the sole basis for treatment or other patient management decisions. A negative result may occur with  improper specimen collection/handling, submission of specimen other than nasopharyngeal swab, presence of viral mutation(s) within the areas targeted by this assay, and inadequate number of viral copies(<138 copies/mL). A negative result must be combined with clinical observations, patient history, and epidemiological information. The expected result is Negative.  Fact Sheet for Patients:  EntrepreneurPulse.com.au  Fact Sheet for Healthcare Providers:  IncredibleEmployment.be  This test is no t yet approved or cleared by the [place] FDA and  has been authorized for detection and/or diagnosis of SARS-CoV-2 by FDA under an Emergency Use Authorization (EUA). This  EUA will remain  in effect (meaning this test can be used) for the duration of the COVID-19 declaration under Section 564(b)(1) of the Act, 21 U.S.C.section 360bbb-3(b)(1), unless the authorization is terminated  or revoked sooner.       Influenza A by PCR NEGATIVE NEGATIVE Final   Influenza B by PCR NEGATIVE NEGATIVE Final    Comment: (NOTE) The Xpert Xpress SARS-CoV-2/FLU/RSV plus assay is intended as an aid in the diagnosis of influenza from Nasopharyngeal swab specimens and should not be used as a sole basis for treatment. Nasal washings and aspirates are unacceptable for Xpert Xpress SARS-CoV-2/FLU/RSV testing.  Fact Sheet for Patients: EntrepreneurPulse.com.au  Fact Sheet for Healthcare Providers: IncredibleEmployment.be  This test is not yet approved or cleared by the [place] FDA and has been authorized for detection and/or diagnosis of SARS-CoV-2 by FDA under an Emergency Use Authorization (EUA). This EUA will remain in effect (meaning this test can be used) for the duration of the COVID-19 declaration under Section 564(b)(1) of the Act, 21 U.S.C. section 360bbb-3(b)(1), unless the authorization is terminated or revoked.  Performed at [facility], [place] [address] [place], [place] [address]   Urine Culture     Status: Abnormal   Collection Time: 06/20/21  7:04 PM   Specimen: Urine, Clean Catch  Result Value Ref Range Status   Specimen Description URINE, CLEAN CATCH  Final   Special Requests   Final    NONE Performed at [facility], [place] [address], [place], [place] [address]    Culture MULTIPLE SPECIES PRESENT, SUGGEST RECOLLECTION (A)  Final   Report Status 06/22/2021 FINAL  Final         Radiology Studies: No results found.      Scheduled Meds:  diclofenac Sodium  2 g Topical QID   enoxaparin (LOVENOX) injection  30 mg Subcutaneous Q24H   insulin aspart  0-15 Units Subcutaneous TID WC    insulin aspart  0-5 Units Subcutaneous QHS   insulin aspart  5 Units Subcutaneous TID WC   insulin  glargine-yfgn  25 Units Subcutaneous Daily   isosorbide mononitrate  120 mg Oral Daily   levothyroxine  25 mcg Intravenous Daily   metoprolol tartrate  5 mg Intravenous Q6H   multivitamin  1 tablet Oral QHS   QUEtiapine  50 mg Oral QHS   ticagrelor  90 mg Oral BID   Continuous Infusions:  cefTRIAXone (ROCEPHIN)  IV 1 g (06/22/21 2004)     LOS: 2 days    Time spent: 35 minutes    Geradine Girt, DO [facility]  If 7PM-7AM, please contact night-coverage [email or web address] 06/23/2021, 1:49 PM

## 2021-06-23 NOTE — Evaluation (Addendum)
Physical Therapy Evaluation Patient Details Name: Tonya Richard MRN: [id number] DOB: [birth year] Today's Date: 06/23/2021   History of Present Illness  75 y/o admitted 7/23 for hyperosmolar hypoglycemic state 2/2 T2DM. PMHx: T2DM, CAD s/p CABG, PVD, CKD stage IV, and chronic anemia  Clinical Impression  Pt labile, especially when talking about home. Pt mentioned having L shoulder pain that worsens with movement. Pt educated on hand placement for bed mobility and transfers. Pt's deficits include decreased activity tolerance, bed mobility, strength, and balance. Pt would benefit from PT to improve said deficits and function. Recommend SNF upon d/c.    Follow Up Recommendations SNF    Equipment Recommendations  None recommended by PT (Pt has equipment at home)    Recommendations for Other Services       Precautions / Restrictions Precautions Precautions: Fall Precaution Comments: incontinence      Mobility  Bed Mobility Overal bed mobility: Needs Assistance Bed Mobility: Supine to Sit     Supine to sit: Min assist     General bed mobility comments: min A to elevate trunk to upright position at EOB.    Transfers Overall transfer level: Needs assistance Equipment used: Rolling walker (2 wheeled) Transfers: Sit to/from [facility] Sit to Stand: Mod assist;+2 physical assistance Stand pivot transfers: Mod assist;+2 physical assistance       General transfer comment: mod A +2 to power up into standing for sit to stand. BSC and recliner moved up to pt when performing transfer. Pt began having BM in standing and immediately requested bed pan/return to sitting. Pt then requested to use BSC to complete the BM, requiring mod A +2 for stand pivot transfer.  Verbal and tactile cues provided for hand placement durng transfers.  Ambulation/Gait                Stairs            Wheelchair Mobility    Modified Rankin (Stroke Patients Only)        Balance Overall balance assessment: Needs assistance Sitting-balance support: Feet unsupported;Bilateral upper extremity supported Sitting balance-Leahy Scale: Poor                                       Pertinent Vitals/Pain Pain Assessment: Faces Pain Score: 8  Pain Location: L shoulder Pain Descriptors / Indicators: Moaning;Grimacing Pain Intervention(s): Limited activity within patient's tolerance;Monitored during session;Repositioned    Home Living Family/patient expects to be discharged to:: Private residence Living Arrangements: Alone Available Help at Discharge: Family (daughter comes over frequently) Type of Home: House Home Access: Stairs to enter Entrance Stairs-Rails: Can reach both Entrance Stairs-Number of Steps: 3 in back entrance 5 in front Home Layout: One level Home Equipment: Leonardo - 4 wheels;Bedside commode;Walker - 2 wheels      Prior Function Level of Independence: Independent with assistive device(s)         Comments: Uses rollator.     Hand Dominance        Extremity/Trunk Assessment                Communication   Communication: Other (comment);No difficulties (Becomes very emotional when talking about home)  Cognition Arousal/Alertness: Awake/alert Behavior During Therapy: WFL for tasks assessed/performed Overall Cognitive Status: No family/caregiver present to determine baseline cognitive functioning  General Comments: A&O x3. Pt had difficulty recalling day/date/month, but able to recall year.      General Comments      Exercises     Assessment/Plan    PT Assessment Patient needs continued PT services  PT Problem List Decreased strength;Decreased balance;Decreased mobility;Decreased activity tolerance       PT Treatment Interventions Gait training;Stair training;Therapeutic activities    PT Goals (Current goals can be found in the Care Plan section)   Acute Rehab PT Goals Patient Stated Goal: Return home PT Goal Formulation: With patient Time For Goal Achievement: 07/07/21 Potential to Achieve Goals: Fair    Frequency Min 2X/week   Barriers to discharge        Co-evaluation               AM-PAC PT "6 Clicks" Mobility  Outcome Measure Help needed turning from your back to your side while in a flat bed without using bedrails?: A Little Help needed moving from lying on your back to sitting on the side of a flat bed without using bedrails?: A Little Help needed moving to and from a bed to a chair (including a wheelchair)?: Total Help needed standing up from a chair using your arms (e.g., wheelchair or bedside chair)?: Total Help needed to walk in hospital room?: Total Help needed climbing 3-5 steps with a railing? : Total 6 Click Score: 10    End of Session Equipment Utilized During Treatment: Gait belt Activity Tolerance: Patient limited by fatigue;Patient limited by pain Patient left: in chair;with call bell/phone within reach;with chair alarm set Nurse Communication: Mobility status PT Visit Diagnosis: Unsteadiness on feet (R26.81);Muscle weakness (generalized) (M62.81)    Time: 9407-6808 PT Time Calculation (min) (ACUTE ONLY): 38 min   Charges:   PT Evaluation $PT Eval Moderate Complexity: 1 Mod PT Treatments $Therapeutic Activity: 23-37 mins        Louie Casa, SPT Acute Rehab: [phone]   Domingo Dimes 06/23/2021, 1:35 PM

## 2021-06-23 NOTE — Progress Notes (Signed)
Notified on-call physician regarding critical blood sugar, will continue to monitor.

## 2021-06-24 DIAGNOSIS — E1301 Other specified diabetes mellitus with hyperosmolarity with coma: Secondary | ICD-10-CM | POA: Diagnosis not present

## 2021-06-24 DIAGNOSIS — I10 Essential (primary) hypertension: Secondary | ICD-10-CM | POA: Diagnosis not present

## 2021-06-24 DIAGNOSIS — G9340 Encephalopathy, unspecified: Secondary | ICD-10-CM

## 2021-06-24 LAB — GLUCOSE, CAPILLARY
Glucose-Capillary: 267 mg/dL — ABNORMAL HIGH (ref 70–99)
Glucose-Capillary: 257 mg/dL — ABNORMAL HIGH (ref 70–99)
Glucose-Capillary: 552 mg/dL (ref 70–99)
Glucose-Capillary: 461 mg/dL — ABNORMAL HIGH (ref 70–99)
Glucose-Capillary: 319 mg/dL — ABNORMAL HIGH (ref 70–99)

## 2021-06-24 LAB — GLUCOSE, RANDOM
Glucose, Bld: 611 mg/dL (ref 70–99)
Glucose, Bld: 448 mg/dL — ABNORMAL HIGH (ref 70–99)

## 2021-06-24 LAB — URINE CULTURE: Culture: <10000 — AB

## 2021-06-24 LAB — BASIC METABOLIC PANEL
Anion gap: 4 — ABNORMAL LOW (ref 5–15)
BUN: 26 mg/dL — ABNORMAL HIGH (ref 8–23)
CO2: 22 mmol/L (ref 22–32)
Calcium: 9 mg/dL (ref 8.9–10.3)
Chloride: 108 mmol/L (ref 98–111)
Creatinine, Ser: 1.7 mg/dL — ABNORMAL HIGH (ref 0.44–1.00)
GFR, Estimated: 31 mL/min — ABNORMAL LOW (ref >60)
Glucose, Bld: 418 mg/dL — ABNORMAL HIGH (ref 70–99)
Potassium: 4.5 mmol/L (ref 3.5–5.1)
Sodium: 134 mmol/L — ABNORMAL LOW (ref 135–145)

## 2021-06-24 MED ORDER — METOPROLOL SUCCINATE ER 100 MG PO TB24
100.0000 mg | ORAL_TABLET | Freq: Every day | ORAL | Status: DC
  Administered 2021-06-24: 100 mg via ORAL
  Filled 2021-06-24: qty 1

## 2021-06-24 MED ORDER — LEVOTHYROXINE SODIUM 50 MCG PO TABS
50.0000 ug | ORAL_TABLET | Freq: Every day | ORAL | Status: DC
  Administered 2021-06-25 – 2021-06-30 (×6): 50 ug via ORAL
  Filled 2021-06-24 (×6): qty 1

## 2021-06-24 MED ORDER — INSULIN ASPART 100 UNIT/ML IJ SOLN
5.0000 [IU] | INTRAMUSCULAR | Status: DC
  Administered 2021-06-24 (×2): 5 [IU] via SUBCUTANEOUS

## 2021-06-24 MED ORDER — INSULIN GLARGINE-YFGN 100 UNIT/ML ~~LOC~~ SOLN
33.0000 [IU] | Freq: Every day | SUBCUTANEOUS | Status: DC
  Administered 2021-06-25 – 2021-06-26 (×2): 33 [IU] via SUBCUTANEOUS
  Filled 2021-06-24 (×2): qty 0.33

## 2021-06-24 MED ORDER — INSULIN ASPART 100 UNIT/ML IJ SOLN
0.0000 [IU] | INTRAMUSCULAR | Status: DC
  Administered 2021-06-24: 20 [IU] via SUBCUTANEOUS
  Administered 2021-06-24: 15 [IU] via SUBCUTANEOUS
  Administered 2021-06-25: 3 [IU] via SUBCUTANEOUS

## 2021-06-24 MED ORDER — INSULIN GLARGINE-YFGN 100 UNIT/ML ~~LOC~~ SOLN
8.0000 [IU] | Freq: Once | SUBCUTANEOUS | Status: AC
  Administered 2021-06-24: 8 [IU] via SUBCUTANEOUS
  Filled 2021-06-24: qty 0.08

## 2021-06-24 MED ORDER — ROSUVASTATIN CALCIUM 20 MG PO TABS
20.0000 mg | ORAL_TABLET | Freq: Every day | ORAL | Status: DC
  Administered 2021-06-24 – 2021-06-30 (×7): 20 mg via ORAL
  Filled 2021-06-24 (×7): qty 1

## 2021-06-24 MED ORDER — PANTOPRAZOLE SODIUM 40 MG PO TBEC
40.0000 mg | DELAYED_RELEASE_TABLET | Freq: Every day | ORAL | Status: DC
  Administered 2021-06-24 – 2021-06-30 (×7): 40 mg via ORAL
  Filled 2021-06-24 (×7): qty 1

## 2021-06-24 NOTE — Evaluation (Signed)
Occupational Therapy Evaluation Patient Details Name: Tonya Richard MRN: [id number] DOB: 02-Jul-1946 Today's Date: 06/24/2021    History of Present Illness 75 y/o admitted 7/23 for hyperosmolar hypoglycemic state 2/2 T2DM. PMHx: T2DM, CAD s/p CABG, PVD, CKD stage IV, and chronic anemia   Clinical Impression   Pt presents with decline in function and safety with ADLs and ADL mobility with impaired strength, balance, endurance and L UE AROM (due to pain). PTA pt lived at home alone per pt reports but then said she lived with her daughter. Pt reports that she was Ind with ADLs/selfcare and used a rollater for mobility. Pt currently requires min A to sit EOB, mod A with UB ADLs, max - total A with LB ADLs, total A with toileting and mod   A +2 for mobility using RW. Pt would benefit from acute OT services to address impairments to maximize level of function and safety    Follow Up Recommendations  SNF    Equipment Recommendations  Other (comment) (TBD at SNF)    Recommendations for Other Services       Precautions / Restrictions Precautions Precautions: Fall Precaution Comments: incontinence Restrictions Weight Bearing Restrictions: No      Mobility Bed Mobility Overal bed mobility: Needs Assistance Bed Mobility: Supine to Sit     Supine to sit: Min assist     General bed mobility comments: min A to elevate trunk to upright position at EOB.    Transfers Overall transfer level: Needs assistance Equipment used: Rolling walker (2 wheeled) Transfers: Sit to/from [facility] Sit to Stand: Mod assist;+2 safety/equipment Stand pivot transfers: Mod assist       General transfer comment: mod A +2 initially for sit - stand from EOB to RW. NT and OT assisted pt with perihygiene while pt standing with RW. SPT to recliner mod A    Balance Overall balance assessment: Needs assistance Sitting-balance support: Feet unsupported;Bilateral upper extremity  supported Sitting balance-Leahy Scale: Fair     Standing balance support: Bilateral upper extremity supported;During functional activity Standing balance-Leahy Scale: Poor                             ADL either performed or assessed with clinical judgement   ADL Overall ADL's : Needs assistance/impaired Eating/Feeding: Set up;Supervision/ safety;Sitting   Grooming: Wash/dry hands;Wash/dry face;Min guard;Sitting   Upper Body Bathing: Moderate assistance   Lower Body Bathing: Maximal assistance   Upper Body Dressing : Moderate assistance Upper Body Dressing Details (indicate cue type and reason): donned clean gown seated EOB Lower Body Dressing: Total assistance   Toilet Transfer: Moderate assistance;+2 for safety/equipment;RW;Stand-pivot;Cueing for sequencing;Cueing for safety Toilet Transfer Details (indicate cue type and reason): simulated to recliner Toileting- Clothing Manipulation and Hygiene: Total assistance Toileting - Clothing Manipulation Details (indicate cue type and reason): clothing mgt and hygiene     Functional mobility during ADLs: Moderate assistance;+2 for safety/equipment;Cueing for safety;Cueing for sequencing;Rolling walker       Vision Baseline Vision/History: Wears glasses Patient Visual Report: No change from baseline       Perception     Praxis      Pertinent Vitals/Pain Pain Assessment: Faces Faces Pain Scale: Hurts even more Pain Location: L shoulder Pain Descriptors / Indicators: Moaning;Grimacing Pain Intervention(s): Monitored during session;Repositioned;Limited activity within patient's tolerance     Hand Dominance Right   Extremity/Trunk Assessment Upper Extremity Assessment Upper Extremity Assessment: Generalized weakness;LUE deficits/detail LUE Deficits /  Details: L shoulder LUE: Unable to fully assess due to pain   Lower Extremity Assessment Lower Extremity Assessment: Defer to PT evaluation        Communication Communication Communication: No difficulties   Cognition Arousal/Alertness: Awake/alert Behavior During Therapy: WFL for tasks assessed/performed Overall Cognitive Status: No family/caregiver present to determine baseline cognitive functioning                                 General Comments: pt jovial this session   General Comments       Exercises     Shoulder Instructions      Home Living Family/patient expects to be discharged to:: Private residence Living Arrangements: Alone;Children (lives with her daughter?) Available Help at Discharge: Family Type of Home: House Home Access: Stairs to enter [employer] of Steps: 3 in back entrance 5 in front Entrance Stairs-Rails: Can reach both Home Layout: One level     Bathroom Shower/Tub: Tub/shower unit;Walk-in shower   Bathroom Toilet: Standard     Home Equipment: Environmental consultant - 4 wheels;Bedside commode;Walker - 2 wheels          Prior Functioning/Environment Level of Independence: Independent with assistive device(s)        Comments: Ind with ADLs/selfcare, toileting and used rollater        OT Problem List: Decreased strength;Impaired balance (sitting and/or standing);Decreased cognition;Decreased safety awareness;Decreased activity tolerance;Decreased knowledge of use of DME or AE;Decreased coordination;Impaired UE functional use      OT Treatment/Interventions: Self-care/ADL training;Therapeutic exercise;Patient/family education;Balance training;Therapeutic activities;DME and/or AE instruction    OT Goals(Current goals can be found in the care plan section) Acute Rehab OT Goals Patient Stated Goal: Return home OT Goal Formulation: With patient Time For Goal Achievement: 07/08/21 Potential to Achieve Goals: Good ADL Goals Pt Will Perform Grooming: with supervision;with set-up;sitting Pt Will Perform Upper Body Bathing: with min assist;sitting Pt Will Perform Lower Body  Bathing: with mod assist;sitting/lateral leans Pt Will Perform Upper Body Dressing: with min assist;sitting Pt Will Transfer to Toilet: with min assist;ambulating;regular height toilet;bedside commode Additional ADL Goal #1: Pt will use L UE with 1-2 verbal cues to assist with ADLs/selfcare  OT Frequency: Min 2X/week   Barriers to D/C:            Co-evaluation              AM-PAC OT "6 Clicks" Daily Activity     Outcome Measure Help from another person eating meals?: A Little Help from another person taking care of personal grooming?: A Little Help from another person toileting, which includes using toliet, bedpan, or urinal?: Total Help from another person bathing (including washing, rinsing, drying)?: A Lot Help from another person to put on and taking off regular upper body clothing?: A Lot Help from another person to put on and taking off regular lower body clothing?: Total 6 Click Score: 12   End of Session Equipment Utilized During Treatment: Gait belt;Rolling walker Nurse Communication: Mobility status  Activity Tolerance: Patient tolerated treatment well Patient left: in chair;with call bell/phone within reach;with chair alarm set;with nursing/sitter in room  OT Visit Diagnosis: Unsteadiness on feet (R26.81);Other abnormalities of gait and mobility (R26.89);History of falling (Z91.81);Muscle weakness (generalized) (M62.81);Pain Pain - Right/Left: Left Pain - part of body: Shoulder;Arm;Hand                Time: 8921-1941 OT Time Calculation (min): 25 min Charges:  OT  General Charges $OT Visit: 1 Visit OT Evaluation $OT Eval Moderate Complexity: 1 Mod OT Treatments $Self Care/Home Management : 8-22 mins   Britt Bottom 06/24/2021, 1:21 PM

## 2021-06-24 NOTE — Progress Notes (Addendum)
PROGRESS NOTE    Tonya Richard  TDH:[id number] DOB: Mar 04, 1946 DOA: 06/20/2021 PCP: Penelope Coop, FNP   Brief Narrative:   Tonya Richard is a 75 y.o. female with history of diabetes mellitus type 2, CAD status post CABG, peripheral vascular disease, chronic kidney disease stage IV, chronic anemia was brought to the ER after patient's daughter found that patient was getting increasingly confused over the last 3 days has been having frequent falls and blood sugar has been running high.  Patient daughter states that she was admitted a similar situation in March of this year at [facility] and patient had very high blood sugar eventually was discharged to rehab and after discharge patient's daughter has been living with her.  Initially she was very compliant with the medication but last few weeks she has been very resistant to taking medications.  Hemoglobin A1c last 1 was around 14 2 weeks ago as per the patient's daughter.  Patient was found to have HHS, started on IV insulin.   Assessment & Plan:   Active Problems:   Hypothyroidism   Essential hypertension, benign   COPD (chronic obstructive pulmonary disease) (HCC)   Hx of CABG   Hyperosmolar hyperglycemic state (HHS) ([place])   Acute delirium   Hyperosmolar non-ketotic state due to type 2 diabetes mellitus (HCC)   Malnutrition of moderate degree   HHS, and periods of hypoglycemia -Appreciate diabetes coordinator recommendations -added long acting and SSI-- adjust for better control -Hemoglobin A1c noted to be greater than 15.5% on 7/23, she will require endocrinology referral once stable enough for discharge -Daughter states she's very noncompliant with diet and medication intake  Acute metabolic encephalopathy -Thought to be secondary to above -CT head without acute intracranial abnormality, there is a remote right occipital infarction with corresponding encephalomalacia -? What her baseline is   Coronary artery disease  status post CABG -Continue Brilinta, Crestor (resume when patient able to take p.o.), metoprolol (changed to IV in setting of decreased p.o.), Imdur   UTI -UA showed moderate leukocytes and rare bacteria.  She was started on empiric Rocephin -Urine culture with multiple species noted, will recollect  CKD stage IV -Stable  Hypothyroidism -Continue Synthroid -TSH 3.81 and T4 1.03  Spoke with daughter  DVT prophylaxis:Lovenox Code Status: DNR Disposition Plan:  Status is: Inpatient  Remains inpatient appropriate because:Altered mental status, IV treatments appropriate due to intensity of illness or inability to take PO, and Inpatient level of care appropriate due to severity of illness  Dispo: The patient is from: Home              Anticipated d/c is to: sNF?              Patient currently is not medically stable to d/c. Await better BS Control and PT Eval   Difficult to place patient No   Consultants:  None    Antimicrobials:  Anti-infectives (From admission, onward)    Start     Dose/Rate Route Frequency Ordered Stop   06/21/21 2000  cefTRIAXone (ROCEPHIN) 1 g in sodium chloride 0.9 % 100 mL IVPB        1 g 200 mL/hr over 30 Minutes Intravenous Every 24 hours 06/20/21 2106     06/20/21 1915  cefTRIAXone (ROCEPHIN) 1 g in sodium chloride 0.9 % 100 mL IVPB        1 g 200 mL/hr over 30 Minutes Intravenous  Once 06/20/21 1903 06/20/21 2127  Subjective: C/o left shoulder pain  Objective: Vitals:   06/24/21 0011 06/24/21 0340 06/24/21 0743 06/24/21 0855  BP: (!) 127/56 115/78 (!) 156/79 (!) 114/59  Pulse: 79 84 92   Resp: 16 20 19 18   Temp: 98.5 F (36.9 C) 97.9 F (36.6 C) 98 F (36.7 C)   TempSrc: Oral Oral Oral   SpO2: 100% 100% 100%   Weight:      Height:        Intake/Output Summary (Last 24 hours) at 06/24/2021 1156 Last data filed at 06/24/2021 8119 Gross per 24 hour  Intake 340 ml  Output 2050 ml  Net -1710 ml   Filed Weights   06/20/21  1501 06/20/21 2348  Weight: 66.2 kg 65.8 kg    Examination:   General: Appearance:     Overweight female in no acute distress     Lungs:     respirations unlabored  Heart:    Normal heart rate. Normal rhythm. No murmurs, rubs, or gallops.    MS:   All extremities are intact. Large bruise on left shoulder/arm   Neurologic:   Awake, alert         Data Reviewed: I have personally reviewed following labs and imaging studies  CBC: Recent Labs  Lab 06/20/21 1504 06/20/21 1702 06/20/21 2140 06/22/21 0126 06/23/21 0150  WBC 9.0  --  9.3 10.3 6.7  HGB 9.8* 8.8* 9.5* 9.4* 8.3*  HCT 30.2* 26.0* 29.2* 28.9* 25.9*  MCV 87.3  --  88.2 87.0 88.4  PLT 185  --  179 185 147   Basic Metabolic Panel: Recent Labs  Lab 06/20/21 1504 06/20/21 1650 06/21/21 0156 06/21/21 0619 06/22/21 0126 06/23/21 0150 06/23/21 1246 06/23/21 1742 06/23/21 1856 06/23/21 2305  NA 122*   < > 138 139 142 141  --   --   --  134*  K 4.0   < > 3.5 3.5 3.6 3.4*  --   --   --  4.5  CL 91*   < > 109 109 113* 115*  --   --   --  108  CO2 19*   < > 21* 21* 24 20*  --   --   --  22  GLUCOSE 914*   < > 120* 194* 94 205* 517* 573* 564* 418*  BUN 37*   < > 28* 27* 15 13  --   --   --  26*  CREATININE 2.34*   < > 1.77* 1.71* 1.50* 1.51*  --   --   --  1.70*  CALCIUM 9.5   < > 9.6 9.7 9.8 9.1  --   --   --  9.0  MG 2.3  --   --   --   --  1.9  --   --   --   --    < > = values in this interval not displayed.   GFR: Estimated Creatinine Clearance: 25.6 mL/min (A) (by C-G formula based on SCr of 1.7 mg/dL (H)). Liver Function Tests: Recent Labs  Lab 06/20/21 1504  AST 13*  ALT 24  ALKPHOS 169*  BILITOT 0.8  PROT 6.1*  ALBUMIN 3.2*   No results for input(s): LIPASE, AMYLASE in the last 168 hours. No results for input(s): AMMONIA in the last 168 hours. Coagulation Profile: No results for input(s): INR, PROTIME in the last 168 hours. Cardiac Enzymes: No results for input(s): CKTOTAL, CKMB, CKMBINDEX,  TROPONINI in the last 168 hours. BNP (last  3 results) No results for input(s): PROBNP in the last 8760 hours. HbA1C: No results for input(s): HGBA1C in the last 72 hours.  CBG: Recent Labs  Lab 06/23/21 2021 06/23/21 2037 06/23/21 2225 06/24/21 0357 06/24/21 0740  GLUCAP 457* 563* 407* 267* 257*   Lipid Profile: No results for input(s): CHOL, HDL, LDLCALC, TRIG, CHOLHDL, LDLDIRECT in the last 72 hours. Thyroid Function Tests: No results for input(s): TSH, T4TOTAL, FREET4, T3FREE, THYROIDAB in the last 72 hours.  Anemia Panel: No results for input(s): VITAMINB12, FOLATE, FERRITIN, TIBC, IRON, RETICCTPCT in the last 72 hours. Sepsis Labs: No results for input(s): PROCALCITON, LATICACIDVEN in the last 168 hours.  Recent Results (from the past 240 hour(s))  Resp Panel by RT-PCR (Flu A&B, Covid) Nasopharyngeal Swab     Status: None   Collection Time: 06/20/21  4:29 PM   Specimen: Nasopharyngeal Swab; Nasopharyngeal(NP) swabs in vial transport medium  Result Value Ref Range Status   SARS Coronavirus 2 by RT PCR NEGATIVE NEGATIVE Final    Comment: (NOTE) SARS-CoV-2 target nucleic acids are NOT DETECTED.  The SARS-CoV-2 RNA is generally detectable in upper respiratory specimens during the acute phase of infection. The lowest concentration of SARS-CoV-2 viral copies this assay can detect is 138 copies/mL. A negative result does not preclude SARS-Cov-2 infection and should not be used as the sole basis for treatment or other patient management decisions. A negative result may occur with  improper specimen collection/handling, submission of specimen other than nasopharyngeal swab, presence of viral mutation(s) within the areas targeted by this assay, and inadequate number of viral copies(<138 copies/mL). A negative result must be combined with clinical observations, patient history, and epidemiological information. The expected result is Negative.  Fact Sheet for Patients:   EntrepreneurPulse.com.au  Fact Sheet for Healthcare Providers:  IncredibleEmployment.be  This test is no t yet approved or cleared by the [place] FDA and  has been authorized for detection and/or diagnosis of SARS-CoV-2 by FDA under an Emergency Use Authorization (EUA). This EUA will remain  in effect (meaning this test can be used) for the duration of the COVID-19 declaration under Section 564(b)(1) of the Act, 21 U.S.C.section 360bbb-3(b)(1), unless the authorization is terminated  or revoked sooner.       Influenza A by PCR NEGATIVE NEGATIVE Final   Influenza B by PCR NEGATIVE NEGATIVE Final    Comment: (NOTE) The Xpert Xpress SARS-CoV-2/FLU/RSV plus assay is intended as an aid in the diagnosis of influenza from Nasopharyngeal swab specimens and should not be used as a sole basis for treatment. Nasal washings and aspirates are unacceptable for Xpert Xpress SARS-CoV-2/FLU/RSV testing.  Fact Sheet for Patients: EntrepreneurPulse.com.au  Fact Sheet for Healthcare Providers: IncredibleEmployment.be  This test is not yet approved or cleared by the [place] FDA and has been authorized for detection and/or diagnosis of SARS-CoV-2 by FDA under an Emergency Use Authorization (EUA). This EUA will remain in effect (meaning this test can be used) for the duration of the COVID-19 declaration under Section 564(b)(1) of the Act, 21 U.S.C. section 360bbb-3(b)(1), unless the authorization is terminated or revoked.  Performed at [facility], [place] [address], [place], [place] [address]   Urine Culture     Status: Abnormal   Collection Time: 06/20/21  7:04 PM   Specimen: Urine, Clean Catch  Result Value Ref Range Status   Specimen Description URINE, CLEAN CATCH  Final   Special Requests   Final    NONE Performed at [facility], [place] [address], [place], [place] [address]    Culture MULTIPLE  SPECIES PRESENT, SUGGEST RECOLLECTION (A)  Final   Report Status 06/22/2021 FINAL  Final  Urine Culture     Status: Abnormal   Collection Time: 06/22/21  2:21 PM   Specimen: Urine, Clean Catch  Result Value Ref Range Status   Specimen Description URINE, CLEAN CATCH  Final   Special Requests NONE  Final   Culture (A)  Final    <10,000 COLONIES/mL INSIGNIFICANT GROWTH Performed at [facility], [place] [address], [place], [place] [address]    Report Status 06/24/2021 FINAL  Final         Radiology Studies: No results found.      Scheduled Meds:  diclofenac Sodium  2 g Topical QID   enoxaparin (LOVENOX) injection  30 mg Subcutaneous Q24H   insulin aspart  0-15 Units Subcutaneous TID WC   insulin aspart  0-5 Units Subcutaneous QHS   insulin aspart  10 Units Subcutaneous TID WC   [START ON 06/25/2021] insulin glargine-yfgn  33 Units Subcutaneous Daily   insulin glargine-yfgn  8 Units Subcutaneous Once   isosorbide mononitrate  120 mg Oral Daily   levothyroxine  25 mcg Intravenous Daily   metoprolol tartrate  5 mg Intravenous Q6H   multivitamin  1 tablet Oral QHS   QUEtiapine  50 mg Oral QHS   ticagrelor  90 mg Oral BID   Continuous Infusions:  cefTRIAXone (ROCEPHIN)  IV 1 g (06/23/21 2112)     LOS: 3 days    Time spent: 35 minutes    Geradine Girt, DO [facility]  If 7PM-7AM, please contact night-coverage [email or web address] 06/24/2021, 11:56 AM

## 2021-06-24 NOTE — Progress Notes (Signed)
Physical Therapy Treatment Patient Details Name: Tonya Richard MRN: [id number] DOB: 09/09/1946 Today's Date: 06/24/2021    History of Present Illness 75 y/o admitted 7/23 for hyperosmolar hypoglycemic state 2/2 T2DM. PMHx: T2DM, CAD s/p CABG, PVD, CKD stage IV, and chronic anemia.    PT Comments    Pt received in supine, agreeable to therapy session and with good participation and fair tolerance for seated/standing tasks at bedside. Emphasis on safety with bed mobiltiy/transfers, use of RW, activity pacing within tolerance, self-monitoring for symptoms due to elevated BP (dizzy in stance and c/o frequent thirst), use of call bell especially due to urinary incontinence. Plan to progress transfers/standing tolerance next session if appropriate. Pt continues to benefit from PT services to progress toward functional mobility goals.    Follow Up Recommendations  SNF     Equipment Recommendations  None recommended by PT (pt has equipment at home)    Recommendations for Other Services       Precautions / Restrictions Precautions Precautions: Fall Precaution Comments: incontinence Restrictions Weight Bearing Restrictions: No    Mobility  Bed Mobility Overal bed mobility: Needs Assistance Bed Mobility: Supine to Sit;Sit to Supine     Supine to sit: Min assist Sit to supine: Min assist   General bed mobility comments: min A to elevate trunk to upright position at EOB, cues for self-assist and use of bed rails as pt attempting to pull up on staff member to sit up; increased time/effort; needs BLE to guide over EOB when returning to supine    Transfers Overall transfer level: Needs assistance Equipment used: Rolling walker (2 wheeled) Transfers: Sit to/from Stand Sit to Stand: Mod assist;From elevated surface         General transfer comment: mod A initially for sit - stand from EOB to RW, unable to stand on first attempt but bed height elevated and with increased assist for  anterior lean, pt able to stand. Wet paper pad exchanged for new pad while pt standing at RW. Sidesteps x3 toward [facility] only, deferred transfer to chair due to fatigue/dizziness and elevated blood sugar reading after initial stand.  Ambulation/Gait             General Gait Details: x3 sidesteps toward [facility]; dizzy, elevated blood sugar taken during session so deferred gait       Balance Overall balance assessment: Needs assistance Sitting-balance support: Feet unsupported;Bilateral upper extremity supported Sitting balance-Leahy Scale: Fair   Postural control: Posterior lean Standing balance support: Bilateral upper extremity supported;During functional activity Standing balance-Leahy Scale: Poor Standing balance comment: RW and external support for safety              Cognition Arousal/Alertness: Awake/alert Behavior During Therapy: WFL for tasks assessed/performed Overall Cognitive Status: No family/caregiver present to determine baseline cognitive functioning       General Comments: cooperative, denies sx of elevated blood sugar but then c/o thirst and dizziness once seated/standing so defer further OOB mobility      Exercises Other Exercises Other Exercises: seated BLE AROM: ankle pumps, LAQ, hip flexion x10 reps ea Other Exercises: supine BLE AROM: ankle pumps, heel slides, hip abduction x10 reps ea    General Comments General comments (skin integrity, edema, etc.): urinary incontinence, blood sugar taken 461 during session and pt c/o dizziness with standing/feeling thirsty. Defer further gait. BP 136/97 (107) seated EOB and BP 149/82 (104) after return to supine      Pertinent Vitals/Pain Pain Assessment: Faces Faces Pain Scale: Hurts  whole lot Pain Location: L shoulder/upper arm (pt reports pain with FF, elbow flex/ext and shld IR/ER) Pain Descriptors / Indicators: Moaning;Grimacing;Sore;Discomfort Pain Intervention(s): Limited activity within patient's  tolerance;Monitored during session;Repositioned;Ice applied     PT Goals (current goals can now be found in the care plan section) Acute Rehab PT Goals Patient Stated Goal: Return home PT Goal Formulation: With patient Time For Goal Achievement: 07/07/21 Progress towards PT goals: Progressing toward goals    Frequency    Min 2X/week      PT Plan Current plan remains appropriate    Co-evaluation              AM-PAC PT "6 Clicks" Mobility   Outcome Measure  Help needed turning from your back to your side while in a flat bed without using bedrails?: A Little Help needed moving from lying on your back to sitting on the side of a flat bed without using bedrails?: A Little Help needed moving to and from a bed to a chair (including a wheelchair)?: A Lot Help needed standing up from a chair using your arms (e.g., wheelchair or bedside chair)?: A Lot Help needed to walk in hospital room?: Total Help needed climbing 3-5 steps with a railing? : Total 6 Click Score: 12    End of Session Equipment Utilized During Treatment: Gait belt Activity Tolerance: Patient limited by fatigue;Other (comment) (elevated blood sugar >400 (OK to see per RN), dizziness in stance) Patient left: in bed;with call bell/phone within reach;with bed alarm set Nurse Communication: Mobility status PT Visit Diagnosis: Unsteadiness on feet (R26.81);Muscle weakness (generalized) (M62.81)     Time: 3888-2800 PT Time Calculation (min) (ACUTE ONLY): 32 min  Charges:  $Therapeutic Exercise: 8-22 mins $Therapeutic Activity: 8-22 mins                     Ramandeep Arington P., PTA Acute Rehabilitation Services Pager: [phone] Office: [place] 06/24/2021, 6:21 PM

## 2021-06-24 NOTE — Progress Notes (Addendum)
Inpatient Diabetes Program Recommendations  AACE/ADA: New Consensus Statement on Inpatient Glycemic Control   Target Ranges:  Prepandial:   less than 140 mg/dL      Peak postprandial:   less than 180 mg/dL (1-2 hours)      Critically ill patients:  140 - 180 mg/dL   Results for YAKELIN, GRENIER (MRN [id number]) as of 06/24/2021 09:16  Ref. Range 06/24/2021 03:57 06/24/2021 07:40  Glucose-Capillary Latest Ref Range: 70 - 99 mg/dL 267 (H) 257 (H)   Results for SAMA, ARAUZ (MRN [id number]) as of 06/24/2021 09:16  Ref. Range 06/23/2021 08:15 06/23/2021 11:53 06/23/2021 17:07 06/23/2021 17:09 06/23/2021 18:33 06/23/2021 20:21 06/23/2021 20:37 06/23/2021 22:25  Glucose-Capillary Latest Ref Range: 70 - 99 mg/dL 127 (H) 431 (H) 580 (HH) 523 (HH) 577 (HH) 457 (H) 563 (HH) 407 (H)    Review of Glycemic Control  Diabetes history: DM Outpatient Diabetes medications: Basaglar 45 units daily, Metformin 2992 mg BID, Trulicity 1.5 mg Qweek Current orders for Inpatient glycemic control: Semglee (glargine) 25 units daily, Novolog 10 units TID with meals, Novolog 0-15 units TID with meals, Novolog 0-5 units QHS  Inpatient Diabetes Program Recommendations:    Insulin: Please consider increasing Semglee to 33 units daily (if agreeable, please also order one time Semglee 8 units x1 now since patient has received 25 units today).   Addendum 06/24/21@14 :55-Went by to talk with patient regarding DM control. Patient lying in bed asleep and woke easily to voice. Inquired about DM management as an outpatient and patient states that she can not remember what she is taking for DM but notes that her daughter gives her all her DM medications and checks her glucose for her. Patient states that she allows her daughter to give her medications as she is prescribed. Patient states that her daughter can be called with any questions because she can not really remember.   Thanks, Barnie Alderman, RN, MSN, CDE Diabetes Coordinator Inpatient  Diabetes Program [phone] (Team Pager from 8am to 5pm)

## 2021-06-25 ENCOUNTER — Other Ambulatory Visit: Payer: Self-pay

## 2021-06-25 DIAGNOSIS — E039 Hypothyroidism, unspecified: Secondary | ICD-10-CM | POA: Diagnosis not present

## 2021-06-25 DIAGNOSIS — E1301 Other specified diabetes mellitus with hyperosmolarity with coma: Secondary | ICD-10-CM | POA: Diagnosis not present

## 2021-06-25 DIAGNOSIS — G9340 Encephalopathy, unspecified: Secondary | ICD-10-CM | POA: Diagnosis not present

## 2021-06-25 LAB — GLUCOSE, CAPILLARY
Glucose-Capillary: 104 mg/dL — ABNORMAL HIGH (ref 70–99)
Glucose-Capillary: 145 mg/dL — ABNORMAL HIGH (ref 70–99)
Glucose-Capillary: 193 mg/dL — ABNORMAL HIGH (ref 70–99)
Glucose-Capillary: 488 mg/dL — ABNORMAL HIGH (ref 70–99)
Glucose-Capillary: 520 mg/dL (ref 70–99)
Glucose-Capillary: 540 mg/dL (ref 70–99)
Glucose-Capillary: 64 mg/dL — ABNORMAL LOW (ref 70–99)

## 2021-06-25 MED ORDER — NICOTINE 21 MG/24HR TD PT24
21.0000 mg | MEDICATED_PATCH | Freq: Every day | TRANSDERMAL | Status: DC
  Administered 2021-06-25 – 2021-06-30 (×6): 21 mg via TRANSDERMAL
  Filled 2021-06-25 (×6): qty 1

## 2021-06-25 MED ORDER — METOPROLOL SUCCINATE ER 50 MG PO TB24
50.0000 mg | ORAL_TABLET | Freq: Every day | ORAL | Status: DC
  Administered 2021-06-25 – 2021-06-28 (×4): 50 mg via ORAL
  Filled 2021-06-25 (×4): qty 1

## 2021-06-25 MED ORDER — INSULIN ASPART 100 UNIT/ML IJ SOLN
20.0000 [IU] | Freq: Three times a day (TID) | INTRAMUSCULAR | Status: DC
  Administered 2021-06-25 – 2021-06-29 (×10): 20 [IU] via SUBCUTANEOUS

## 2021-06-25 MED ORDER — INSULIN ASPART 100 UNIT/ML IJ SOLN
0.0000 [IU] | Freq: Three times a day (TID) | INTRAMUSCULAR | Status: DC
  Administered 2021-06-25: 20 [IU] via SUBCUTANEOUS
  Administered 2021-06-25: 4 [IU] via SUBCUTANEOUS
  Administered 2021-06-25 – 2021-06-26 (×3): 20 [IU] via SUBCUTANEOUS
  Administered 2021-06-26: 4 [IU] via SUBCUTANEOUS
  Administered 2021-06-27: 7 [IU] via SUBCUTANEOUS
  Administered 2021-06-27: 20 [IU] via SUBCUTANEOUS
  Administered 2021-06-27 (×2): 7 [IU] via SUBCUTANEOUS
  Administered 2021-06-28: 4 [IU] via SUBCUTANEOUS
  Administered 2021-06-28: 11 [IU] via SUBCUTANEOUS
  Administered 2021-06-28: 7 [IU] via SUBCUTANEOUS
  Administered 2021-06-29 (×2): 15 [IU] via SUBCUTANEOUS
  Administered 2021-06-29: 4 [IU] via SUBCUTANEOUS
  Administered 2021-06-30: 3 [IU] via SUBCUTANEOUS

## 2021-06-25 MED ORDER — INSULIN ASPART 100 UNIT/ML IJ SOLN
10.0000 [IU] | Freq: Three times a day (TID) | INTRAMUSCULAR | Status: DC
  Administered 2021-06-25: 10 [IU] via SUBCUTANEOUS

## 2021-06-25 MED ORDER — INSULIN ASPART 100 UNIT/ML IJ SOLN
0.0000 [IU] | Freq: Every day | INTRAMUSCULAR | Status: DC
Start: 2021-06-25 — End: 2021-06-30
  Administered 2021-06-27: 5 [IU] via SUBCUTANEOUS
  Administered 2021-06-28: 10 [IU] via SUBCUTANEOUS

## 2021-06-25 MED ORDER — INSULIN ASPART 100 UNIT/ML IJ SOLN
25.0000 [IU] | Freq: Once | INTRAMUSCULAR | Status: AC
  Administered 2021-06-25: 25 [IU] via SUBCUTANEOUS

## 2021-06-25 NOTE — Progress Notes (Signed)
Spoke with daughter on the phone about her mother's medications and diabetes. States that she was diagnosed with diabetes in early adulthood. Has been on insulin for about 30 years. Started with Metformin initially. States that she saw her PCP about 2 weeks ago and follows up with her routinely.   Current insulin medications are: Basaglar 50 units in am, 10 units at HS, Novolog sliding scale with meals.   Had been off insulin for about 1 week after being in hospital last admission in order to get her blood sugars regulated and restarted on insulin. Her HgA1c had gotten down to 9% in the past, but over the last month she has not been taking her insulin and other medications the way she should have been. HgbA1C is now >15%. Daughter states that her mother is picky about what she eats and can be difficult with her care. Patient likes to sleep when she wants to and timing of medications is hard, along with eating meals.  Daughter states that she herself has diabetes and is very familiar with what kind of care that her mother needs. Daughter states that her mother can still cook and do many things for herself in the home such as dressing, etc. Daughter plans to continue caring for her mother until she is unable to do everyday activities for herself.   Harvel Ricks RN BSN CDE Diabetes Coordinator Pager: [phone]  8am-5pm

## 2021-06-25 NOTE — NC FL2 (Signed)
[place] LEVEL OF CARE SCREENING TOOL     IDENTIFICATION  Patient Name: Tonya Richard Birthdate: 1946/03/01 Sex: female Admission Date (Current Location): 06/20/2021  [facility] [place] Number:  [employer] and Address:  [facility] [place], [place] [address] [place], [place] [address]      Provider Number: [id number]  Attending Physician Name and Address:  Geradine Girt, DO  Relative Name and Phone Number:       Current Level of Care: Hospital Recommended Level of Care: [place] Prior Approval Number:    Date Approved/Denied:   PASRR Number: [id number]  Discharge Plan: SNF    Current Diagnoses: Patient Active Problem List   Diagnosis Date Noted   Malnutrition of moderate degree 06/23/2021   Hyperosmolar hyperglycemic state (HHS) ([place]) 06/20/2021   Acute delirium 06/20/2021   Hyperosmolar non-ketotic state due to type 2 diabetes mellitus ([place]) 06/20/2021   Shortness of breath    Myocardial infarction ([place])    Hypertension    Hyperlipidemia    AKI (acute kidney injury) ([place]) 12/11/2020   Peripheral vascular disease ([place]) 05/12/2020   Late effect of cerebrovascular accident (CVA) 03/18/2020   Long-term use of aspirin therapy 04/16/2019   Chronic coronary artery disease 04/16/2019   COPD (chronic obstructive pulmonary disease) ([place]) 10/13/2018   Type 2 diabetes mellitus with circulatory disorder, with long-term current use of insulin ([place]) 09/06/2017   Abnormal stress test 08/09/2017   Chest pain 08/09/2017   Hx of CABG 08/09/2017   Mixed hyperlipidemia 08/09/2017   PAD (peripheral artery disease) ([place]) 08/09/2017   Benign essential hypertension 08/09/2017   Pre-op evaluation 06/29/2016   Enteritis due to Clostridium difficile 08/17/2014   Acute renal failure ([place]) 08/16/2014   DM type 2 (diabetes mellitus, type 2) ([place]) 08/16/2014   CAD (coronary artery disease), native coronary artery 08/16/2014    Vomiting and diarrhea 08/16/2014   Strep pharyngitis 08/16/2014   Hypothyroidism 08/16/2014   GERD (gastroesophageal reflux disease) 08/16/2014   Hypoglycemia 08/16/2014   Essential hypertension, benign 08/16/2014   Asthma, chronic 88/41/6606   Metabolic acidosis 30/16/0109   Morbid obesity ([place]) 08/16/2014    Orientation RESPIRATION BLADDER Height & Weight     Self, Place, Situation  Normal Incontinent Weight: 145 lb 1 oz (65.8 kg) Height:  5\' 1"  (154.9 cm)  BEHAVIORAL SYMPTOMS/MOOD NEUROLOGICAL BOWEL NUTRITION STATUS      Incontinent Diet (please see discharge summary)  AMBULATORY STATUS COMMUNICATION OF NEEDS Skin   Supervision   Normal                       Personal Care Assistance Level of Assistance              Functional Limitations Info  Sight, Hearing, Speech Sight Info: Impaired Hearing Info: Adequate Speech Info: Adequate    SPECIAL CARE FACTORS FREQUENCY  PT (By licensed PT), OT (By licensed OT)     PT Frequency: 5x per week OT Frequency: 5x per week            Contractures Contractures Info: Not present    Additional Factors Info  Code Status, Allergies Code Status Info: Full Allergies Info: Atorvastatin           Current Medications (06/25/2021):  This is the current hospital active medication list Current Facility-Administered Medications  Medication Dose Route Frequency Provider Last Rate Last Admin   acetaminophen (TYLENOL) tablet 650 mg  650  mg Oral Q6H PRN Rise Patience, MD   650 mg at 06/23/21 0656   dextrose 50 % solution 0-50 mL  0-50 mL Intravenous PRN Rise Patience, MD       diclofenac Sodium (VOLTAREN) 1 % topical gel 2 g  2 g Topical QID Vann, Jessica U, DO   2 g at 06/25/21 9476   enoxaparin (LOVENOX) injection 30 mg  30 mg Subcutaneous Q24H Rise Patience, MD   30 mg at 06/24/21 2058   insulin aspart (novoLOG) injection 0-20 Units  0-20 Units Subcutaneous TID WC Eulogio Bear U, DO   20 Units at  06/25/21 1219   insulin aspart (novoLOG) injection 0-5 Units  0-5 Units Subcutaneous QHS Vann, Jessica U, DO       insulin aspart (novoLOG) injection 10 Units  10 Units Subcutaneous TID WC Vann, Jessica U, DO       insulin glargine-yfgn (SEMGLEE) injection 33 Units  33 Units Subcutaneous Daily Eulogio Bear U, DO   33 Units at 06/25/21 0835   ipratropium-albuterol (DUONEB) 0.5-2.5 (3) MG/3ML nebulizer solution 3 mL  3 mL Nebulization QID PRN Eulogio Bear U, DO       isosorbide mononitrate (IMDUR) 24 hr tablet 120 mg  120 mg Oral Daily Dessa Phi, DO   120 mg at 06/25/21 0830   labetalol (NORMODYNE) injection 10 mg  10 mg Intravenous Q2H PRN Rise Patience, MD       levothyroxine (SYNTHROID) tablet 50 mcg  50 mcg Oral Q0600 Eulogio Bear U, DO   50 mcg at 06/25/21 0542   LORazepam (ATIVAN) injection 0.5 mg  0.5 mg Intravenous Q4H PRN Dessa Phi, DO   0.5 mg at 06/22/21 0030   metoprolol succinate (TOPROL-XL) 24 hr tablet 50 mg  50 mg Oral Daily Eulogio Bear U, DO   50 mg at 06/25/21 0830   multivitamin (RENA-VIT) tablet 1 tablet  1 tablet Oral QHS Manuella Ghazi, Pratik D, DO   1 tablet at 06/24/21 2058   pantoprazole (PROTONIX) EC tablet 40 mg  40 mg Oral Daily Eulogio Bear U, DO   40 mg at 06/25/21 0830   QUEtiapine (SEROQUEL) tablet 50 mg  50 mg Oral QHS Vann, Jessica U, DO   50 mg at 06/24/21 2058   rosuvastatin (CRESTOR) tablet 20 mg  20 mg Oral Daily Eulogio Bear U, DO   20 mg at 06/25/21 5465   ticagrelor (BRILINTA) tablet 90 mg  90 mg Oral BID Dessa Phi, DO   90 mg at 06/25/21 0830     Discharge Medications: Please see discharge summary for a list of discharge medications.  Relevant Imaging Results:  Relevant Lab Results:   Additional Information SSN [id number] [place], [place]

## 2021-06-25 NOTE — Social Work (Signed)
CSW called patient's daughter,Sheri- left voice message to return call.  Thurmond Butts, MSW, LCSW Clinical Social Worker

## 2021-06-25 NOTE — TOC Initial Note (Signed)
Transition of Care [facility]) - Initial/Assessment Note    Patient Details  Name: Tonya Richard MRN: [id number] Date of Birth: July 08, 1946  [facility]) CM/SW Contact:    Vinie Sill, LCSW Phone Number: 06/25/2021, 2:36 PM  Clinical Narrative:                  CSW spoke with patient's daughter,Sheri. CSW introduced self and explained role. CSW discussed with her therapy recommendation of short term rehab at [facility]. She is agreeable to short term rehab before discharge home. CSW explained the SNF process. She states  her preference is to secure SNF in [place] near their home, if not, her second choice is [facility] in [place]. She was unsure about her vaccination status but she is agreeable to vaccine is eligible.   CSW will provide bed offers once available. CSW faxed clinicals to [facility] to review for possible placement waiting on response TOC  will continue to follow and assist with discharge planning.  Thurmond Butts, MSW, LCSW Clinical Social Worker    Expected Discharge Plan: Skilled Nursing Facility Barriers to Discharge: Continued Medical Work up, SNF Pending bed offer   Patient Goals and CMS Choice        Expected Discharge Plan and Services Expected Discharge Plan: [place] In-house Referral: Clinical Social Work     Living arrangements for the past 2 months: Single Family Home                                      Prior Living Arrangements/Services Living arrangements for the past 2 months: Single Family Home Lives with:: Self (stay home alone but her daughter has been there w/her since she has been sick) Patient language and need for interpreter reviewed:: No        Need for Family Participation in Patient Care: Yes (Comment) Care giver support system in place?: Yes (comment)   Criminal Activity/Legal Involvement Pertinent to Current Situation/Hospitalization: No - Comment as needed  Activities of Daily Living    ADL Screening (condition at time of admission) Patient's cognitive ability adequate to safely complete daily activities?: Yes Is the patient deaf or have difficulty hearing?: No Does the patient have difficulty seeing, even when wearing glasses/contacts?: No Does the patient have difficulty concentrating, remembering, or making decisions?: Yes Patient able to express need for assistance with ADLs?: Yes Does the patient have difficulty dressing or bathing?: Yes Independently performs ADLs?: No Communication: Independent Dressing (OT): Needs assistance Is this a change from baseline?: Change from baseline, expected to last >3 days Grooming: Needs assistance Is this a change from baseline?: Change from baseline, expected to last >3 days Feeding: Needs assistance Is this a change from baseline?: Change from baseline, expected to last >3 days Bathing: Needs assistance Is this a change from baseline?: Change from baseline, expected to last >3 days Toileting: Needs assistance Is this a change from baseline?: Change from baseline, expected to last >3days In/Out Bed: Needs assistance Is this a change from baseline?: Change from baseline, expected to last >3 days Walks in Home: Independent with device (comment) Does the patient have difficulty walking or climbing stairs?: Yes Weakness of Legs: None Weakness of Arms/Hands: Left  Permission Sought/Granted Permission sought to share information with : Family Supports    Share Information with NAME: Eusebio Friendly  Permission granted to share info w AGENCY: SNFs  Permission granted to  share info w Relationship: daughter  Permission granted to share info w Contact Information: [phone]  Emotional Assessment       Orientation: : Oriented to Place, Oriented to Self, Oriented to Situation Alcohol / Substance Use: Not Applicable Psych Involvement: No (comment)  Admission diagnosis:  Secondary diabetes mellitus with HHNC (hyperglycemia  hyperosmolar non-ketotic coma) ([place]) [E13.01] Acute encephalopathy [G93.40] Hyperosmolar non-ketotic state due to type 2 diabetes mellitus ([place]) [E11.00] Hyperosmolar hyperglycemic state (HHS) ([place]) [E11.00, E11.65] Patient Active Problem List   Diagnosis Date Noted   Malnutrition of moderate degree 06/23/2021   Hyperosmolar hyperglycemic state (HHS) ([place]) 06/20/2021   Acute delirium 06/20/2021   Hyperosmolar non-ketotic state due to type 2 diabetes mellitus ([place]) 06/20/2021   Shortness of breath    Myocardial infarction ([place])    Hypertension    Hyperlipidemia    AKI (acute kidney injury) ([place]) 12/11/2020   Peripheral vascular disease ([place]) 05/12/2020   Late effect of cerebrovascular accident (CVA) 03/18/2020   Long-term use of aspirin therapy 04/16/2019   Chronic coronary artery disease 04/16/2019   COPD (chronic obstructive pulmonary disease) ([place]) 10/13/2018   Type 2 diabetes mellitus with circulatory disorder, with long-term current use of insulin ([place]) 09/06/2017   Abnormal stress test 08/09/2017   Chest pain 08/09/2017   Hx of CABG 08/09/2017   Mixed hyperlipidemia 08/09/2017   PAD (peripheral artery disease) ([place]) 08/09/2017   Benign essential hypertension 08/09/2017   Pre-op evaluation 06/29/2016   Enteritis due to Clostridium difficile 08/17/2014   Acute renal failure ([place]) 08/16/2014   DM type 2 (diabetes mellitus, type 2) ([place]) 08/16/2014   CAD (coronary artery disease), native coronary artery 08/16/2014   Vomiting and diarrhea 08/16/2014   Strep pharyngitis 08/16/2014   Hypothyroidism 08/16/2014   GERD (gastroesophageal reflux disease) 08/16/2014   Hypoglycemia 08/16/2014   Essential hypertension, benign 08/16/2014   Asthma, chronic 79/01/8332   Metabolic acidosis 83/29/1916   Morbid obesity ([place]) 08/16/2014   PCP:  Penelope Coop, FNP Pharmacy:   [facility] [phone] - [place], [address] AT [place] [address] [place] [address] Phone: [phone] Fax: [phone]     Social Determinants of Health (SDOH) Interventions    Readmission Risk Interventions No flowsheet data found.

## 2021-06-25 NOTE — Progress Notes (Signed)
PROGRESS NOTE    Tonya Richard  VVO:[id number] DOB: 04-Nov-1946 DOA: 06/20/2021 PCP: Penelope Coop, FNP   Brief Narrative:   Tonya Richard is a 75 y.o. female with history of diabetes mellitus type 2, CAD status post CABG, peripheral vascular disease, chronic kidney disease stage IV, chronic anemia was brought to the ER after patient's daughter found that patient was getting increasingly confused over the last 3 days has been having frequent falls and blood sugar has been running high.  Patient daughter states that she was admitted a similar situation in March of this year at [facility] and patient had very high blood sugar eventually was discharged to rehab and after discharge patient's daughter has been living with her.  Initially she was very compliant with the medication but last few weeks she has been very resistant to taking medications.  Hemoglobin A1c was around 14 2 weeks ago as per the patient's daughter.  Patient was found to have HHS, started on IV insulin.   Assessment & Plan:   Active Problems:   Hypothyroidism   Essential hypertension, benign   COPD (chronic obstructive pulmonary disease) (HCC)   Hx of CABG   Hyperosmolar hyperglycemic state (HHS) ([place])   Acute delirium   Hyperosmolar non-ketotic state due to type 2 diabetes mellitus (HCC)   Malnutrition of moderate degree   HHS, and periods of hypoglycemia -Appreciate diabetes coordinator recommendations -added long acting and SSI-- adjust for better control -Hemoglobin A1c noted to be greater than 15.5% on 7/23, she will require endocrinology referral once stable enough for discharge -Daughter states she's very noncompliant with diet and medication intake  Acute metabolic encephalopathy -Thought to be secondary to above -CT head without acute intracranial abnormality, there is a remote right occipital infarction with corresponding encephalomalacia -? What her baseline is   Coronary artery disease status  post CABG -resume home meds   UTI -treated d/c abx  CKD stage IV -Stable  Hypothyroidism -Continue Synthroid -TSH 3.81 and T4 1.03  Spoke with daughter 7/27  DVT prophylaxis:Lovenox Code Status: DNR Disposition Plan:  Status is: Inpatient  Remains inpatient appropriate because:Altered mental status, IV treatments appropriate due to intensity of illness or inability to take PO, and Inpatient level of care appropriate due to severity of illness  Dispo: The patient is from: Home              Anticipated d/c is to: sNF?              Patient currently is not medically stable to d/c. Await better BS Control   Difficult to place patient No   Consultants:  None    Antimicrobials:  Anti-infectives (From admission, onward)    Start     Dose/Rate Route Frequency Ordered Stop   06/21/21 2000  cefTRIAXone (ROCEPHIN) 1 g in sodium chloride 0.9 % 100 mL IVPB  Status:  Discontinued        1 g 200 mL/hr over 30 Minutes Intravenous Every 24 hours 06/20/21 2106 06/24/21 1201   06/20/21 1915  cefTRIAXone (ROCEPHIN) 1 g in sodium chloride 0.9 % 100 mL IVPB        1 g 200 mL/hr over 30 Minutes Intravenous  Once 06/20/21 1903 06/20/21 2127       Subjective: Feeling better today, would like her daughter to visit  Objective: Vitals:   06/24/21 2347 06/25/21 0412 06/25/21 0830 06/25/21 1200  BP: (!) 92/55 (!) 101/36 (!) 142/72 (!) 153/78  Pulse: 73  76 85 86  Resp: 19 20 18 18   Temp:  98 F (36.7 C)  97.7 F (36.5 C)  TempSrc:  Axillary  Oral  SpO2: 97% 98% 100% 99%  Weight:      Height:        Intake/Output Summary (Last 24 hours) at 06/25/2021 1207 Last data filed at 06/25/2021 0500 Gross per 24 hour  Intake 980 ml  Output --  Net 980 ml   Filed Weights   06/20/21 1501 06/20/21 2348  Weight: 66.2 kg 65.8 kg    Examination:   General: Appearance:     Overweight female in no acute distress     Lungs:     respirations unlabored  Heart:    Normal heart rate.  Normal rhythm. No murmurs, rubs, or gallops.    MS:   All extremities are intact.    Neurologic:   Awake, alert, pleasant and cooperative           Data Reviewed: I have personally reviewed following labs and imaging studies  CBC: Recent Labs  Lab 06/20/21 1504 06/20/21 1702 06/20/21 2140 06/22/21 0126 06/23/21 0150  WBC 9.0  --  9.3 10.3 6.7  HGB 9.8* 8.8* 9.5* 9.4* 8.3*  HCT 30.2* 26.0* 29.2* 28.9* 25.9*  MCV 87.3  --  88.2 87.0 88.4  PLT 185  --  179 185 419   Basic Metabolic Panel: Recent Labs  Lab 06/20/21 1504 06/20/21 1650 06/21/21 0156 06/21/21 0619 06/22/21 0126 06/23/21 0150 06/23/21 1246 06/23/21 1742 06/23/21 1856 06/23/21 2305 06/24/21 1240 06/24/21 1653  NA 122*   < > 138 139 142 141  --   --   --  134*  --   --   K 4.0   < > 3.5 3.5 3.6 3.4*  --   --   --  4.5  --   --   CL 91*   < > 109 109 113* 115*  --   --   --  108  --   --   CO2 19*   < > 21* 21* 24 20*  --   --   --  22  --   --   GLUCOSE 914*   < > 120* 194* 94 205*   < > 573* 564* 418* 611* 448*  BUN 37*   < > 28* 27* 15 13  --   --   --  26*  --   --   CREATININE 2.34*   < > 1.77* 1.71* 1.50* 1.51*  --   --   --  1.70*  --   --   CALCIUM 9.5   < > 9.6 9.7 9.8 9.1  --   --   --  9.0  --   --   MG 2.3  --   --   --   --  1.9  --   --   --   --   --   --    < > = values in this interval not displayed.   GFR: Estimated Creatinine Clearance: 25.6 mL/min (A) (by C-G formula based on SCr of 1.7 mg/dL (H)). Liver Function Tests: Recent Labs  Lab 06/20/21 1504  AST 13*  ALT 24  ALKPHOS 169*  BILITOT 0.8  PROT 6.1*  ALBUMIN 3.2*   No results for input(s): LIPASE, AMYLASE in the last 168 hours. No results for input(s): AMMONIA in the last 168 hours. Coagulation Profile: No results for input(s): INR,  PROTIME in the last 168 hours. Cardiac Enzymes: No results for input(s): CKTOTAL, CKMB, CKMBINDEX, TROPONINI in the last 168 hours. BNP (last 3 results) No results for input(s): PROBNP  in the last 8760 hours. HbA1C: No results for input(s): HGBA1C in the last 72 hours.  CBG: Recent Labs  Lab 06/25/21 0003 06/25/21 0419 06/25/21 0452 06/25/21 0819 06/25/21 1141  GLUCAP 145* 64* 104* 193* 488*   Lipid Profile: No results for input(s): CHOL, HDL, LDLCALC, TRIG, CHOLHDL, LDLDIRECT in the last 72 hours. Thyroid Function Tests: No results for input(s): TSH, T4TOTAL, FREET4, T3FREE, THYROIDAB in the last 72 hours.  Anemia Panel: No results for input(s): VITAMINB12, FOLATE, FERRITIN, TIBC, IRON, RETICCTPCT in the last 72 hours. Sepsis Labs: No results for input(s): PROCALCITON, LATICACIDVEN in the last 168 hours.  Recent Results (from the past 240 hour(s))  Resp Panel by RT-PCR (Flu A&B, Covid) Nasopharyngeal Swab     Status: None   Collection Time: 06/20/21  4:29 PM   Specimen: Nasopharyngeal Swab; Nasopharyngeal(NP) swabs in vial transport medium  Result Value Ref Range Status   SARS Coronavirus 2 by RT PCR NEGATIVE NEGATIVE Final    Comment: (NOTE) SARS-CoV-2 target nucleic acids are NOT DETECTED.  The SARS-CoV-2 RNA is generally detectable in upper respiratory specimens during the acute phase of infection. The lowest concentration of SARS-CoV-2 viral copies this assay can detect is 138 copies/mL. A negative result does not preclude SARS-Cov-2 infection and should not be used as the sole basis for treatment or other patient management decisions. A negative result may occur with  improper specimen collection/handling, submission of specimen other than nasopharyngeal swab, presence of viral mutation(s) within the areas targeted by this assay, and inadequate number of viral copies(<138 copies/mL). A negative result must be combined with clinical observations, patient history, and epidemiological information. The expected result is Negative.  Fact Sheet for Patients:  EntrepreneurPulse.com.au  Fact Sheet for Healthcare Providers:   IncredibleEmployment.be  This test is no t yet approved or cleared by the [place] FDA and  has been authorized for detection and/or diagnosis of SARS-CoV-2 by FDA under an Emergency Use Authorization (EUA). This EUA will remain  in effect (meaning this test can be used) for the duration of the COVID-19 declaration under Section 564(b)(1) of the Act, 21 U.S.C.section 360bbb-3(b)(1), unless the authorization is terminated  or revoked sooner.       Influenza A by PCR NEGATIVE NEGATIVE Final   Influenza B by PCR NEGATIVE NEGATIVE Final    Comment: (NOTE) The Xpert Xpress SARS-CoV-2/FLU/RSV plus assay is intended as an aid in the diagnosis of influenza from Nasopharyngeal swab specimens and should not be used as a sole basis for treatment. Nasal washings and aspirates are unacceptable for Xpert Xpress SARS-CoV-2/FLU/RSV testing.  Fact Sheet for Patients: EntrepreneurPulse.com.au  Fact Sheet for Healthcare Providers: IncredibleEmployment.be  This test is not yet approved or cleared by the [place] FDA and has been authorized for detection and/or diagnosis of SARS-CoV-2 by FDA under an Emergency Use Authorization (EUA). This EUA will remain in effect (meaning this test can be used) for the duration of the COVID-19 declaration under Section 564(b)(1) of the Act, 21 U.S.C. section 360bbb-3(b)(1), unless the authorization is terminated or revoked.  Performed at [facility], [place] [address], [place], [place] [address]   Urine Culture     Status: Abnormal   Collection Time: 06/20/21  7:04 PM   Specimen: Urine, Clean Catch  Result Value Ref Range Status  Specimen Description URINE, CLEAN CATCH  Final   Special Requests   Final    NONE Performed at [facility], [place] [address], [place], [place] [address]    Culture MULTIPLE SPECIES PRESENT, SUGGEST RECOLLECTION (A)  Final   Report Status 06/22/2021 FINAL   Final  Urine Culture     Status: Abnormal   Collection Time: 06/22/21  2:21 PM   Specimen: Urine, Clean Catch  Result Value Ref Range Status   Specimen Description URINE, CLEAN CATCH  Final   Special Requests NONE  Final   Culture (A)  Final    <10,000 COLONIES/mL INSIGNIFICANT GROWTH Performed at [facility], [place] [address] [place], [place] [address]    Report Status 06/24/2021 FINAL  Final         Radiology Studies: No results found.      Scheduled Meds:  diclofenac Sodium  2 g Topical QID   enoxaparin (LOVENOX) injection  30 mg Subcutaneous Q24H   insulin aspart  0-20 Units Subcutaneous TID WC   insulin aspart  0-5 Units Subcutaneous QHS   insulin aspart  10 Units Subcutaneous TID WC   insulin glargine-yfgn  33 Units Subcutaneous Daily   isosorbide mononitrate  120 mg Oral Daily   levothyroxine  50 mcg Oral Q0600   metoprolol succinate  50 mg Oral Daily   multivitamin  1 tablet Oral QHS   pantoprazole  40 mg Oral Daily   QUEtiapine  50 mg Oral QHS   rosuvastatin  20 mg Oral Daily   ticagrelor  90 mg Oral BID   Continuous Infusions:     LOS: 4 days    Time spent: 25 minutes    Geradine Girt, DO [facility]  If 7PM-7AM, please contact night-coverage [email or web address] 06/25/2021, 12:07 PM

## 2021-06-26 DIAGNOSIS — G9340 Encephalopathy, unspecified: Secondary | ICD-10-CM | POA: Diagnosis not present

## 2021-06-26 DIAGNOSIS — E1301 Other specified diabetes mellitus with hyperosmolarity with coma: Secondary | ICD-10-CM | POA: Diagnosis not present

## 2021-06-26 DIAGNOSIS — I10 Essential (primary) hypertension: Secondary | ICD-10-CM | POA: Diagnosis not present

## 2021-06-26 LAB — GLUCOSE, CAPILLARY
Glucose-Capillary: 191 mg/dL — ABNORMAL HIGH (ref 70–99)
Glucose-Capillary: 345 mg/dL — ABNORMAL HIGH (ref 70–99)
Glucose-Capillary: 396 mg/dL — ABNORMAL HIGH (ref 70–99)
Glucose-Capillary: 485 mg/dL — ABNORMAL HIGH (ref 70–99)
Glucose-Capillary: 512 mg/dL (ref 70–99)
Glucose-Capillary: 520 mg/dL (ref 70–99)
Glucose-Capillary: 580 mg/dL (ref 70–99)

## 2021-06-26 MED ORDER — LINAGLIPTIN 5 MG PO TABS
5.0000 mg | ORAL_TABLET | Freq: Every day | ORAL | Status: DC
  Administered 2021-06-26 – 2021-06-30 (×5): 5 mg via ORAL
  Filled 2021-06-26 (×5): qty 1

## 2021-06-26 MED ORDER — INSULIN ASPART 100 UNIT/ML IJ SOLN
15.0000 [IU] | Freq: Once | INTRAMUSCULAR | Status: AC
  Administered 2021-06-26: 15 [IU] via SUBCUTANEOUS

## 2021-06-26 MED ORDER — GLUCERNA SHAKE PO LIQD
237.0000 mL | Freq: Three times a day (TID) | ORAL | Status: DC
  Administered 2021-06-26 – 2021-06-27 (×3): 237 mL via ORAL

## 2021-06-26 MED ORDER — INSULIN GLARGINE-YFGN 100 UNIT/ML ~~LOC~~ SOLN
45.0000 [IU] | Freq: Every day | SUBCUTANEOUS | Status: DC
  Administered 2021-06-27 – 2021-06-28 (×2): 45 [IU] via SUBCUTANEOUS
  Filled 2021-06-26 (×2): qty 0.45

## 2021-06-26 NOTE — TOC Progression Note (Signed)
Transition of Care [facility]) - Progression Note    Patient Details  Name: Tonya Richard MRN: [id number] Date of Birth: December 20, 1945  Transition [facility] CM/SW [place], [place] Phone Number: 06/26/2021, 3:28 PM  Clinical Narrative:    CSW spoke with pt's daughter.  Pt daughter has chosen [facility] for [employer].  CSW spoke with Raquel Sarna at [facility].  [facility] has a bed available for pt.  Raquel Sarna at [facility] will start insurance auth.  TOC will continue to assist with disposition planing.   Expected Discharge Plan: [place] Barriers to Discharge: Continued Medical Work up, SNF Pending bed offer  Expected Discharge Plan and Services Expected Discharge Plan: [place] In-house Referral: Clinical Social Work     Living arrangements for the past 2 months: Single Family Home                                       Social Determinants of Health (SDOH) Interventions    Readmission Risk Interventions No flowsheet data found.

## 2021-06-26 NOTE — Progress Notes (Signed)
Results for Tonya Richard, Tonya Richard (MRN [id number]) as of 06/26/2021 02:11  Ref. Range 06/25/2021 04:19 06/25/2021 04:52 06/25/2021 08:19 06/25/2021 11:41 06/25/2021 16:21 06/25/2021 20:10 06/26/2021 00:22  Glucose-Capillary Latest Ref Range: 70 - 99 mg/dL 64 (L) 104 (H) 193 (H) 488 (H) 540 (HH) 520 (HH) 345 (H)    At the beginning of night shift, CBG 520 mg/dl, above the level of sliding scale of insulin requirement at bedtime. We notified Dr. Konrad Felix, the on-call provider per protocol (if CBG > 400). Order received for 25 units of Novolog one time dose. After that , we rechecked CBG 345 mg/dl at 00:22. Pt was asymptomatic, hemodynamically stable. No acute distress. We will continue to monitor.   Kennyth Lose, RN

## 2021-06-26 NOTE — Progress Notes (Signed)
Nutrition Follow-up  DOCUMENTATION CODES:  Non-severe (moderate) malnutrition in context of chronic illness  INTERVENTION:  Discontinue Hormel shakes BID.  Continue Magic Cup TID.  Continue feeding assistance with meals.  Continue Rena-Vite daily.  Add Glucerna Shake po TID, each supplement provides 220 kcal and 10 grams of protein.  NUTRITION DIAGNOSIS:  Moderate Malnutrition related to chronic illness (CKD4, T2DM) as evidenced by mild fat depletion, mild muscle depletion, moderate muscle depletion. - ongoing  GOAL:  Patient will meet greater than or equal to 90% of their needs - mostly meeting  MONITOR:  PO intake, Supplement acceptance, Labs, Weight trends, I & O's  REASON FOR ASSESSMENT:  Consult Assessment of nutrition requirement/status, Diet education  ASSESSMENT:  75 yo female with a PMH of T2DM, CAD s/p CABG, peripheral vascular disease, CKD stage 4, and chronic anemia who presents with HHS (now resolved) and AMS.  RD consulted due to pt with high blood sugars from Hormel shake BID.   Pt with much better PO intake, 60-100% from the last 5 documented meals.   RD to discontinue the Hormel shake BID and start Glucerna shakes TID.   Medications: reviewed; SSI, bedtime Novolog, mealtime Novolog, Semglee, Synthroid, Rena-Vit, Protonix  Labs: reviewed; CBG [phone]  Diet Order:   Diet Order             Diet heart healthy/carb modified Room service appropriate? Yes; Fluid consistency: Thin  Diet effective now                  EDUCATION NEEDS:  Not appropriate for education at this time  Skin:  Skin Assessment: Reviewed RN Assessment  Last BM:  06/25/21 - Type 2, large  Height:  Ht Readings from Last 1 Encounters:  06/20/21 5\' 1"  (1.549 m)   Weight:  Wt Readings from Last 1 Encounters:  06/20/21 65.8 kg   BMI:  Body mass index is 27.41 kg/m.  Estimated Nutritional Needs:  Kcal:  2122-4825 Protein:  85-100 grams Fluid:  >1.75  L  Derrel Nip, RD, LDN (she/her/hers) Registered Dietitian I After-Hours/Weekend Pager # in [place]

## 2021-06-26 NOTE — Progress Notes (Signed)
PROGRESS NOTE    Tonya Richard  SMO:[id number] DOB: Oct 25, 1946 DOA: 06/20/2021 PCP: Penelope Coop, FNP   Brief Narrative:   Tonya Richard is a 75 y.o. female with history of diabetes mellitus type 2, CAD status post CABG, peripheral vascular disease, chronic kidney disease stage IV, chronic anemia was brought to the ER after patient's daughter found that patient was getting increasingly confused over the last 3 days has been having frequent falls and blood sugar has been running high.  Patient daughter states that she was admitted a similar situation in March of this year at [facility] and patient had very high blood sugar eventually was discharged to rehab and after discharge patient's daughter has been living with her.  Initially she was very compliant with the medication but last few weeks she has been very resistant to taking medications.  Hemoglobin A1c was around 14 2 weeks ago as per the patient's daughter.  Patient was found to have HHS, started on IV insulin.   Assessment & Plan:   Active Problems:   Hypothyroidism   Essential hypertension, benign   COPD (chronic obstructive pulmonary disease) (HCC)   Hx of CABG   Hyperosmolar hyperglycemic state (HHS) ([place])   Acute delirium   Hyperosmolar non-ketotic state due to type 2 diabetes mellitus (HCC)   Malnutrition of moderate degree   HHS, and periods of hypoglycemia -Appreciate diabetes coordinator recommendations -added long acting and SSI-- adjust for better control -Hemoglobin A1c noted to be greater than 15.5% on 7/23, she will require endocrinology referral once stable enough for discharge -Daughter states she's very noncompliant with diet and medication intake  Acute metabolic encephalopathy -Thought to be secondary to above -CT head without acute intracranial abnormality, there is a remote right occipital infarction with corresponding encephalomalacia -appears to be at her Baseline   Coronary artery disease  status post CABG -resume home meds   UTI -treated d/c abx  CKD stage IV -Stable  Hypothyroidism -Continue Synthroid -TSH 3.81 and T4 1.03  Spoke with daughter 7/27  DVT prophylaxis:Lovenox Code Status: DNR Disposition Plan:  Status is: Inpatient  Remains inpatient appropriate because:Altered mental status, IV treatments appropriate due to intensity of illness or inability to take PO, and Inpatient level of care appropriate due to severity of illness  Dispo: The patient is from: Home              Anticipated d/c is to: sNF?              Patient currently is not medically stable to d/c. Await better BS Control   Difficult to place patient No   Consultants:  None    Antimicrobials:  Anti-infectives (From admission, onward)    Start     Dose/Rate Route Frequency Ordered Stop   06/21/21 2000  cefTRIAXone (ROCEPHIN) 1 g in sodium chloride 0.9 % 100 mL IVPB  Status:  Discontinued        1 g 200 mL/hr over 30 Minutes Intravenous Every 24 hours 06/20/21 2106 06/24/21 1201   06/20/21 1915  cefTRIAXone (ROCEPHIN) 1 g in sodium chloride 0.9 % 100 mL IVPB        1 g 200 mL/hr over 30 Minutes Intravenous  Once 06/20/21 1903 06/20/21 2127       Subjective: Denies SOB, no CP  Objective: Vitals:   06/25/21 2004 06/25/21 2310 06/26/21 0341 06/26/21 0733  BP: (!) 142/85 140/77 (!) 101/57 106/87  Pulse: 95 94 88 90  Resp: 16 (!)  21 17 16   Temp: 98 F (36.7 C) 98.4 F (36.9 C) 98.4 F (36.9 C) (!) 97.5 F (36.4 C)  TempSrc: Oral Oral Oral Oral  SpO2: 100% 98% 100% 99%  Weight:      Height:        Intake/Output Summary (Last 24 hours) at 06/26/2021 1144 Last data filed at 06/26/2021 4431 Gross per 24 hour  Intake 1730 ml  Output --  Net 1730 ml   Filed Weights   06/20/21 1501 06/20/21 2348  Weight: 66.2 kg 65.8 kg    Examination:   General: Appearance:     Overweight female in no acute distress     Lungs:     respirations unlabored  Heart:    Normal heart  rate.   MS:   All extremities are intact.    Neurologic:   Pleasant and cooperative              Data Reviewed: I have personally reviewed following labs and imaging studies  CBC: Recent Labs  Lab 06/20/21 1504 06/20/21 1702 06/20/21 2140 06/22/21 0126 06/23/21 0150  WBC 9.0  --  9.3 10.3 6.7  HGB 9.8* 8.8* 9.5* 9.4* 8.3*  HCT 30.2* 26.0* 29.2* 28.9* 25.9*  MCV 87.3  --  88.2 87.0 88.4  PLT 185  --  179 185 540   Basic Metabolic Panel: Recent Labs  Lab 06/20/21 1504 06/20/21 1650 06/21/21 0156 06/21/21 0619 06/22/21 0126 06/23/21 0150 06/23/21 1246 06/23/21 1742 06/23/21 1856 06/23/21 2305 06/24/21 1240 06/24/21 1653  NA 122*   < > 138 139 142 141  --   --   --  134*  --   --   K 4.0   < > 3.5 3.5 3.6 3.4*  --   --   --  4.5  --   --   CL 91*   < > 109 109 113* 115*  --   --   --  108  --   --   CO2 19*   < > 21* 21* 24 20*  --   --   --  22  --   --   GLUCOSE 914*   < > 120* 194* 94 205*   < > 573* 564* 418* 611* 448*  BUN 37*   < > 28* 27* 15 13  --   --   --  26*  --   --   CREATININE 2.34*   < > 1.77* 1.71* 1.50* 1.51*  --   --   --  1.70*  --   --   CALCIUM 9.5   < > 9.6 9.7 9.8 9.1  --   --   --  9.0  --   --   MG 2.3  --   --   --   --  1.9  --   --   --   --   --   --    < > = values in this interval not displayed.   GFR: Estimated Creatinine Clearance: 25.6 mL/min (A) (by C-G formula based on SCr of 1.7 mg/dL (H)). Liver Function Tests: Recent Labs  Lab 06/20/21 1504  AST 13*  ALT 24  ALKPHOS 169*  BILITOT 0.8  PROT 6.1*  ALBUMIN 3.2*   No results for input(s): LIPASE, AMYLASE in the last 168 hours. No results for input(s): AMMONIA in the last 168 hours. Coagulation Profile: No results for input(s): INR, PROTIME in the last 168 hours.  Cardiac Enzymes: No results for input(s): CKTOTAL, CKMB, CKMBINDEX, TROPONINI in the last 168 hours. BNP (last 3 results) No results for input(s): PROBNP in the last 8760 hours. HbA1C: No results for  input(s): HGBA1C in the last 72 hours.  CBG: Recent Labs  Lab 06/25/21 1621 06/25/21 2010 06/26/21 0022 06/26/21 0618 06/26/21 1051  GLUCAP 540* 520* 345* 191* 396*   Lipid Profile: No results for input(s): CHOL, HDL, LDLCALC, TRIG, CHOLHDL, LDLDIRECT in the last 72 hours. Thyroid Function Tests: No results for input(s): TSH, T4TOTAL, FREET4, T3FREE, THYROIDAB in the last 72 hours.  Anemia Panel: No results for input(s): VITAMINB12, FOLATE, FERRITIN, TIBC, IRON, RETICCTPCT in the last 72 hours. Sepsis Labs: No results for input(s): PROCALCITON, LATICACIDVEN in the last 168 hours.  Recent Results (from the past 240 hour(s))  Resp Panel by RT-PCR (Flu A&B, Covid) Nasopharyngeal Swab     Status: None   Collection Time: 06/20/21  4:29 PM   Specimen: Nasopharyngeal Swab; Nasopharyngeal(NP) swabs in vial transport medium  Result Value Ref Range Status   SARS Coronavirus 2 by RT PCR NEGATIVE NEGATIVE Final    Comment: (NOTE) SARS-CoV-2 target nucleic acids are NOT DETECTED.  The SARS-CoV-2 RNA is generally detectable in upper respiratory specimens during the acute phase of infection. The lowest concentration of SARS-CoV-2 viral copies this assay can detect is 138 copies/mL. A negative result does not preclude SARS-Cov-2 infection and should not be used as the sole basis for treatment or other patient management decisions. A negative result may occur with  improper specimen collection/handling, submission of specimen other than nasopharyngeal swab, presence of viral mutation(s) within the areas targeted by this assay, and inadequate number of viral copies(<138 copies/mL). A negative result must be combined with clinical observations, patient history, and epidemiological information. The expected result is Negative.  Fact Sheet for Patients:  EntrepreneurPulse.com.au  Fact Sheet for Healthcare Providers:  IncredibleEmployment.be  This test  is no t yet approved or cleared by the [place] FDA and  has been authorized for detection and/or diagnosis of SARS-CoV-2 by FDA under an Emergency Use Authorization (EUA). This EUA will remain  in effect (meaning this test can be used) for the duration of the COVID-19 declaration under Section 564(b)(1) of the Act, 21 U.S.C.section 360bbb-3(b)(1), unless the authorization is terminated  or revoked sooner.       Influenza A by PCR NEGATIVE NEGATIVE Final   Influenza B by PCR NEGATIVE NEGATIVE Final    Comment: (NOTE) The Xpert Xpress SARS-CoV-2/FLU/RSV plus assay is intended as an aid in the diagnosis of influenza from Nasopharyngeal swab specimens and should not be used as a sole basis for treatment. Nasal washings and aspirates are unacceptable for Xpert Xpress SARS-CoV-2/FLU/RSV testing.  Fact Sheet for Patients: EntrepreneurPulse.com.au  Fact Sheet for Healthcare Providers: IncredibleEmployment.be  This test is not yet approved or cleared by the [place] FDA and has been authorized for detection and/or diagnosis of SARS-CoV-2 by FDA under an Emergency Use Authorization (EUA). This EUA will remain in effect (meaning this test can be used) for the duration of the COVID-19 declaration under Section 564(b)(1) of the Act, 21 U.S.C. section 360bbb-3(b)(1), unless the authorization is terminated or revoked.  Performed at [facility], [place] [address] [place], [place] [address]   Urine Culture     Status: Abnormal   Collection Time: 06/20/21  7:04 PM   Specimen: Urine, Clean Catch  Result Value Ref Range Status   Specimen Description URINE, CLEAN  CATCH  Final   Special Requests   Final    NONE Performed at [facility], [place] [address] [place], [place] [address]    Culture MULTIPLE SPECIES PRESENT, SUGGEST RECOLLECTION (A)  Final   Report Status 06/22/2021 FINAL  Final  Urine Culture     Status: Abnormal   Collection  Time: 06/22/21  2:21 PM   Specimen: Urine, Clean Catch  Result Value Ref Range Status   Specimen Description URINE, CLEAN CATCH  Final   Special Requests NONE  Final   Culture (A)  Final    <10,000 COLONIES/mL INSIGNIFICANT GROWTH Performed at [facility], [place] [address] [place], [place] [address]    Report Status 06/24/2021 FINAL  Final         Radiology Studies: No results found.      Scheduled Meds:  diclofenac Sodium  2 g Topical QID   enoxaparin (LOVENOX) injection  30 mg Subcutaneous Q24H   feeding supplement (GLUCERNA SHAKE)  237 mL Oral TID BM   insulin aspart  0-20 Units Subcutaneous TID WC   insulin aspart  0-5 Units Subcutaneous QHS   insulin aspart  20 Units Subcutaneous TID WC   [START ON 06/27/2021] insulin glargine-yfgn  45 Units Subcutaneous Daily   isosorbide mononitrate  120 mg Oral Daily   levothyroxine  50 mcg Oral Q0600   metoprolol succinate  50 mg Oral Daily   multivitamin  1 tablet Oral QHS   nicotine  21 mg Transdermal Daily   pantoprazole  40 mg Oral Daily   QUEtiapine  50 mg Oral QHS   rosuvastatin  20 mg Oral Daily   ticagrelor  90 mg Oral BID   Continuous Infusions:     LOS: 5 days    Time spent: 25 minutes    Geradine Girt, DO [facility]  If 7PM-7AM, please contact night-coverage [email or web address] 06/26/2021, 11:44 AM

## 2021-06-26 NOTE — Progress Notes (Signed)
Physical Therapy Treatment Patient Details Name: Tonya Richard MRN: [id number] DOB: 1946/10/23 Today's Date: 06/26/2021    History of Present Illness 75 y/o admitted 7/23 for hyperosmolar hypoglycemic state 2/2 T2DM. PMHx: T2DM, CAD s/p CABG, PVD, CKD stage IV, and chronic anemia.    PT Comments    Pt making steady progress with mobility and able to amb a short distance. Will continue to work toward incr ambulation and will try a rollator like pt uses at home.    Follow Up Recommendations  SNF     Equipment Recommendations  None recommended by PT    Recommendations for Other Services       Precautions / Restrictions Precautions Precautions: Fall    Mobility  Bed Mobility Overal bed mobility: Needs Assistance Bed Mobility: Supine to Sit     Supine to sit: Min assist     General bed mobility comments: Assist to elevate trunk into sitting    Transfers Overall transfer level: Needs assistance Equipment used: Rolling walker (2 wheeled);1 person hand held assist Transfers: Sit to/from [facility] Sit to Stand: Min assist Stand pivot transfers: Min assist       General transfer comment: Assist to bring hips up and for balance. Performed bed to chair with hand held to pivot. Stood from Psychologist, occupational with walker prior to amb  Ambulation/Gait Ambulation/Gait assistance: Herbalist (Feet): 8 Feet Assistive device: Rolling walker (2 wheeled) Gait Pattern/deviations: Step-through pattern;Decreased step length - right;Decreased step length - left;Trunk flexed   Gait velocity interpretation: <1.31 ft/sec, indicative of household ambulator General Gait Details: Assist for balance. Pt doesn't like rolling walker and prefers a rollator like she uses at home.   Stairs             Wheelchair Mobility    Modified Rankin (Stroke Patients Only)       Balance Overall balance assessment: Needs assistance Sitting-balance support: No upper  extremity supported;Feet supported Sitting balance-Leahy Scale: Good Sitting balance - Comments: Leaned forward in chair to get something off the floor without LOB   Standing balance support: Single extremity supported;Bilateral upper extremity supported Standing balance-Leahy Scale: Poor Standing balance comment: UE support and min guard for static standing                            Cognition Arousal/Alertness: Awake/alert Behavior During Therapy: WFL for tasks assessed/performed Overall Cognitive Status: No family/caregiver present to determine baseline cognitive functioning                                 General Comments: Pt following 1 step commands. Can tell me about her therapy in the past but then with tangential speech.      Exercises      General Comments        Pertinent Vitals/Pain Pain Assessment: Faces Faces Pain Scale: Hurts even more Pain Location: Lt shoulder and upper arm Pain Descriptors / Indicators: Grimacing;Guarding;Sore Pain Intervention(s): Limited activity within patient's tolerance;Repositioned    Home Living                      Prior Function            PT Goals (current goals can now be found in the care plan section) Acute Rehab PT Goals Patient Stated Goal: Return home Progress towards PT goals: Progressing toward  goals    Frequency    Min 2X/week      PT Plan Current plan remains appropriate    Co-evaluation              AM-PAC PT "6 Clicks" Mobility   Outcome Measure    Help needed moving from lying on your back to sitting on the side of a flat bed without using bedrails?: A Little Help needed moving to and from a bed to a chair (including a wheelchair)?: A Little Help needed standing up from a chair using your arms (e.g., wheelchair or bedside chair)?: A Little Help needed to walk in hospital room?: A Little Help needed climbing 3-5 steps with a railing? : Total 6 Click Score:  13    End of Session Equipment Utilized During Treatment: Gait belt Activity Tolerance: Patient tolerated treatment well Patient left: in chair;with call bell/phone within reach;with chair alarm set Nurse Communication: Mobility status;Other (comment) (nurse tech and that bed stripped due to wet and purewick out) PT Visit Diagnosis: Unsteadiness on feet (R26.81);Muscle weakness (generalized) (M62.81)     Time: 1624-4695 PT Time Calculation (min) (ACUTE ONLY): 17 min  Charges:  $Gait Training: 8-22 mins                     [place] Pager [phone] [place] 06/26/2021, 5:29 PM

## 2021-06-26 NOTE — Progress Notes (Signed)
Results for TERECIA, PLAUT (MRN [id number]) as of 06/26/2021 11:16  Ref. Range 06/25/2021 16:21 06/25/2021 20:10 06/26/2021 00:22 06/26/2021 06:18 06/26/2021 10:51  Glucose-Capillary Latest Ref Range: 70 - 99 mg/dL 540 (HH) 520 (HH) 345 (H) 191 (H) 396 (H)  Noted that patient's blood sugars have been up and down.   Recommend increasing Semglee to 45 - 50 units daily (takes Basaglar 45 units daily at home). Patient did have mild hypoglycemia on 7/25 after getting 50 units of Semglee. Continue Novolog correction scale and meal coverage as ordered. Ttirate dosages as needed.   Harvel Ricks RN BSN CDE Diabetes Coordinator Pager: [phone]  8am-5pm

## 2021-06-27 DIAGNOSIS — E440 Moderate protein-calorie malnutrition: Secondary | ICD-10-CM

## 2021-06-27 DIAGNOSIS — I10 Essential (primary) hypertension: Secondary | ICD-10-CM | POA: Diagnosis not present

## 2021-06-27 DIAGNOSIS — E1301 Other specified diabetes mellitus with hyperosmolarity with coma: Secondary | ICD-10-CM | POA: Diagnosis not present

## 2021-06-27 LAB — CBC
HCT: 25.2 % — ABNORMAL LOW (ref 36.0–46.0)
Hemoglobin: 7.9 g/dL — ABNORMAL LOW (ref 12.0–15.0)
MCH: 28.1 pg (ref 26.0–34.0)
MCHC: 31.3 g/dL (ref 30.0–36.0)
MCV: 89.7 fL (ref 80.0–100.0)
Platelets: 172 10*3/uL (ref 150–400)
RBC: 2.81 MIL/uL — ABNORMAL LOW (ref 3.87–5.11)
RDW: 14.2 % (ref 11.5–15.5)
WBC: 7.2 10*3/uL (ref 4.0–10.5)
nRBC: 0 % (ref 0.0–0.2)

## 2021-06-27 LAB — GLUCOSE, CAPILLARY
Glucose-Capillary: 207 mg/dL — ABNORMAL HIGH (ref 70–99)
Glucose-Capillary: 283 mg/dL — ABNORMAL HIGH (ref 70–99)
Glucose-Capillary: 312 mg/dL — ABNORMAL HIGH (ref 70–99)
Glucose-Capillary: 391 mg/dL — ABNORMAL HIGH (ref 70–99)
Glucose-Capillary: 392 mg/dL — ABNORMAL HIGH (ref 70–99)
Glucose-Capillary: 402 mg/dL — ABNORMAL HIGH (ref 70–99)

## 2021-06-27 LAB — BASIC METABOLIC PANEL
Anion gap: 6 (ref 5–15)
BUN: 37 mg/dL — ABNORMAL HIGH (ref 8–23)
CO2: 24 mmol/L (ref 22–32)
Calcium: 9.7 mg/dL (ref 8.9–10.3)
Chloride: 106 mmol/L (ref 98–111)
Creatinine, Ser: 1.71 mg/dL — ABNORMAL HIGH (ref 0.44–1.00)
GFR, Estimated: 31 mL/min — ABNORMAL LOW (ref >60)
Glucose, Bld: 194 mg/dL — ABNORMAL HIGH (ref 70–99)
Potassium: 4.5 mmol/L (ref 3.5–5.1)
Sodium: 136 mmol/L (ref 135–145)

## 2021-06-27 MED ORDER — FUROSEMIDE 20 MG PO TABS
20.0000 mg | ORAL_TABLET | Freq: Once | ORAL | Status: AC
  Administered 2021-06-27: 20 mg via ORAL
  Filled 2021-06-27: qty 1

## 2021-06-27 NOTE — TOC Progression Note (Signed)
Transition of Care [facility]) - Progression Note    Patient Details  Name: Tonya Richard MRN: [id number] Date of Birth: 01-15-1946  Transition of Care [facility]) CM/SW [place], [place] Phone Number: [phone] 06/27/2021, 12:17 PM  Clinical Narrative:     CSW faxed requested needed recent PT note to [place] at [place] at [phone]. Faxed was confirmed received.  TOC team will continue to assist with discharge planning needs.    Expected Discharge Plan: [place] Barriers to Discharge: Continued Medical Work up, SNF Pending bed offer  Expected Discharge Plan and Services Expected Discharge Plan: [place] In-house Referral: Clinical Social Work     Living arrangements for the past 2 months: Single Family Home                                       Social Determinants of Health (SDOH) Interventions    Readmission Risk Interventions No flowsheet data found.

## 2021-06-27 NOTE — Progress Notes (Signed)
PROGRESS NOTE    Tonya Richard  EZM:[id number] DOB: 1946-04-11 DOA: 06/20/2021 PCP: Penelope Coop, FNP   Brief Narrative:   Tonya Richard is a 75 y.o. female with history of diabetes mellitus type 2, CAD status post CABG, peripheral vascular disease, chronic kidney disease stage IV, chronic anemia was brought to the ER after patient's daughter found that patient was getting increasingly confused over the last 3 days has been having frequent falls and blood sugar has been running high.  Patient daughter states that she was admitted a similar situation in March of this year at [facility] and patient had very high blood sugar eventually was discharged to rehab and after discharge patient's daughter has been living with her.  Initially she was very compliant with the medication but last few weeks she has been very resistant to taking medications.  Hemoglobin A1c was around 14 2 weeks ago as per the patient's daughter.  Patient was found to have HHS, started on IV insulin.   Assessment & Plan:   Active Problems:   Hypothyroidism   Essential hypertension, benign   COPD (chronic obstructive pulmonary disease) (HCC)   Hx of CABG   Hyperosmolar hyperglycemic state (HHS) ([place])   Acute delirium   Hyperosmolar non-ketotic state due to type 2 diabetes mellitus (HCC)   Malnutrition of moderate degree   HHS, and periods of hypoglycemia -Appreciate diabetes coordinator recommendations -added long acting and SSI-- adjust for better control -Hemoglobin A1c noted to be greater than 15.5% on 7/23, she will require endocrinology referral once stable enough for discharge -Daughter states she's very noncompliant with diet and medication intake  Acute metabolic encephalopathy -Thought to be secondary to above -CT head without acute intracranial abnormality, there is a remote right occipital infarction with corresponding encephalomalacia -appears to be at her Baseline   Coronary artery disease  status post CABG -resume home meds   UTI -treated d/c abx  CKD stage IV -Stable  Anemia -suspect some ABLA from large bruise on arm but also could be from multiple lab draws -check stools  Hypothyroidism -Continue Synthroid -TSH 3.81 and T4 1.03  Spoke with daughter 7/27  DVT prophylaxis:Lovenox Code Status: DNR Disposition Plan:  Status is: Inpatient  Remains inpatient appropriate because:Altered mental status, IV treatments appropriate due to intensity of illness or inability to take PO, and Inpatient level of care appropriate due to severity of illness  Dispo: The patient is from: Home              Anticipated d/c is to: sNF?              Patient currently is not medically stable to d/c. Await better BS Control   Difficult to place patient No   Consultants:  None    Antimicrobials:  Anti-infectives (From admission, onward)    Start     Dose/Rate Route Frequency Ordered Stop   06/21/21 2000  cefTRIAXone (ROCEPHIN) 1 g in sodium chloride 0.9 % 100 mL IVPB  Status:  Discontinued        1 g 200 mL/hr over 30 Minutes Intravenous Every 24 hours 06/20/21 2106 06/24/21 1201   06/20/21 1915  cefTRIAXone (ROCEPHIN) 1 g in sodium chloride 0.9 % 100 mL IVPB        1 g 200 mL/hr over 30 Minutes Intravenous  Once 06/20/21 1903 06/20/21 2127       Subjective: Asking when her daughter is coming  Objective: Vitals:   06/26/21 1935 06/26/21  2354 06/27/21 0402 06/27/21 0733  BP: (!) 123/58 (!) 123/105 (!) 88/52 (!) 142/75  Pulse: 88 83 89 91  Resp: 20 (!) 21 19 18   Temp: 97.7 F (36.5 C) (!) 97.3 F (36.3 C) 97.9 F (36.6 C) 97.7 F (36.5 C)  TempSrc: Oral Oral Oral Oral  SpO2: 100% 100% 100% 99%  Weight:      Height:        Intake/Output Summary (Last 24 hours) at 06/27/2021 1250 Last data filed at 06/27/2021 0938 Gross per 24 hour  Intake --  Output 850 ml  Net -850 ml   Filed Weights   06/20/21 1501 06/20/21 2348  Weight: 66.2 kg 65.8 kg     Examination:  General: Appearance:     Overweight female in no acute distress     Lungs:     respirations unlabored  Heart:    Normal heart rate. Normal rhythm. No murmurs, rubs, or gallops.    MS:   All extremities are intact.    Neurologic:   Pleasant and cooperative     Data Reviewed: I have personally reviewed following labs and imaging studies  CBC: Recent Labs  Lab 06/20/21 1504 06/20/21 1702 06/20/21 2140 06/22/21 0126 06/23/21 0150 06/27/21 0538  WBC 9.0  --  9.3 10.3 6.7 7.2  HGB 9.8* 8.8* 9.5* 9.4* 8.3* 7.9*  HCT 30.2* 26.0* 29.2* 28.9* 25.9* 25.2*  MCV 87.3  --  88.2 87.0 88.4 89.7  PLT 185  --  179 185 157 182   Basic Metabolic Panel: Recent Labs  Lab 06/20/21 1504 06/20/21 1650 06/21/21 0619 06/22/21 0126 06/23/21 0150 06/23/21 1246 06/23/21 1856 06/23/21 2305 06/24/21 1240 06/24/21 1653 06/27/21 0538  NA 122*   < > 139 142 141  --   --  134*  --   --  136  K 4.0   < > 3.5 3.6 3.4*  --   --  4.5  --   --  4.5  CL 91*   < > 109 113* 115*  --   --  108  --   --  106  CO2 19*   < > 21* 24 20*  --   --  22  --   --  24  GLUCOSE 914*   < > 194* 94 205*   < > 564* 418* 611* 448* 194*  BUN 37*   < > 27* 15 13  --   --  26*  --   --  37*  CREATININE 2.34*   < > 1.71* 1.50* 1.51*  --   --  1.70*  --   --  1.71*  CALCIUM 9.5   < > 9.7 9.8 9.1  --   --  9.0  --   --  9.7  MG 2.3  --   --   --  1.9  --   --   --   --   --   --    < > = values in this interval not displayed.   GFR: Estimated Creatinine Clearance: 25.4 mL/min (A) (by C-G formula based on SCr of 1.71 mg/dL (H)). Liver Function Tests: Recent Labs  Lab 06/20/21 1504  AST 13*  ALT 24  ALKPHOS 169*  BILITOT 0.8  PROT 6.1*  ALBUMIN 3.2*   No results for input(s): LIPASE, AMYLASE in the last 168 hours. No results for input(s): AMMONIA in the last 168 hours. Coagulation Profile: No results for input(s): INR, PROTIME in the  last 168 hours. Cardiac Enzymes: No results for input(s):  CKTOTAL, CKMB, CKMBINDEX, TROPONINI in the last 168 hours. BNP (last 3 results) No results for input(s): PROBNP in the last 8760 hours. HbA1C: No results for input(s): HGBA1C in the last 72 hours.  CBG: Recent Labs  Lab 06/26/21 2132 06/26/21 2218 06/27/21 0610 06/27/21 1151 06/27/21 1211  GLUCAP 512* 485* 207* 283* 312*   Lipid Profile: No results for input(s): CHOL, HDL, LDLCALC, TRIG, CHOLHDL, LDLDIRECT in the last 72 hours. Thyroid Function Tests: No results for input(s): TSH, T4TOTAL, FREET4, T3FREE, THYROIDAB in the last 72 hours.  Anemia Panel: No results for input(s): VITAMINB12, FOLATE, FERRITIN, TIBC, IRON, RETICCTPCT in the last 72 hours. Sepsis Labs: No results for input(s): PROCALCITON, LATICACIDVEN in the last 168 hours.  Recent Results (from the past 240 hour(s))  Resp Panel by RT-PCR (Flu A&B, Covid) Nasopharyngeal Swab     Status: None   Collection Time: 06/20/21  4:29 PM   Specimen: Nasopharyngeal Swab; Nasopharyngeal(NP) swabs in vial transport medium  Result Value Ref Range Status   SARS Coronavirus 2 by RT PCR NEGATIVE NEGATIVE Final    Comment: (NOTE) SARS-CoV-2 target nucleic acids are NOT DETECTED.  The SARS-CoV-2 RNA is generally detectable in upper respiratory specimens during the acute phase of infection. The lowest concentration of SARS-CoV-2 viral copies this assay can detect is 138 copies/mL. A negative result does not preclude SARS-Cov-2 infection and should not be used as the sole basis for treatment or other patient management decisions. A negative result may occur with  improper specimen collection/handling, submission of specimen other than nasopharyngeal swab, presence of viral mutation(s) within the areas targeted by this assay, and inadequate number of viral copies(<138 copies/mL). A negative result must be combined with clinical observations, patient history, and epidemiological information. The expected result is Negative.  Fact  Sheet for Patients:  EntrepreneurPulse.com.au  Fact Sheet for Healthcare Providers:  IncredibleEmployment.be  This test is no t yet approved or cleared by the [place] FDA and  has been authorized for detection and/or diagnosis of SARS-CoV-2 by FDA under an Emergency Use Authorization (EUA). This EUA will remain  in effect (meaning this test can be used) for the duration of the COVID-19 declaration under Section 564(b)(1) of the Act, 21 U.S.C.section 360bbb-3(b)(1), unless the authorization is terminated  or revoked sooner.       Influenza A by PCR NEGATIVE NEGATIVE Final   Influenza B by PCR NEGATIVE NEGATIVE Final    Comment: (NOTE) The Xpert Xpress SARS-CoV-2/FLU/RSV plus assay is intended as an aid in the diagnosis of influenza from Nasopharyngeal swab specimens and should not be used as a sole basis for treatment. Nasal washings and aspirates are unacceptable for Xpert Xpress SARS-CoV-2/FLU/RSV testing.  Fact Sheet for Patients: EntrepreneurPulse.com.au  Fact Sheet for Healthcare Providers: IncredibleEmployment.be  This test is not yet approved or cleared by the [place] FDA and has been authorized for detection and/or diagnosis of SARS-CoV-2 by FDA under an Emergency Use Authorization (EUA). This EUA will remain in effect (meaning this test can be used) for the duration of the COVID-19 declaration under Section 564(b)(1) of the Act, 21 U.S.C. section 360bbb-3(b)(1), unless the authorization is terminated or revoked.  Performed at [facility], [place] [address], [place], [place] [address]   Urine Culture     Status: Abnormal   Collection Time: 06/20/21  7:04 PM   Specimen: Urine, Clean Catch  Result Value Ref Range Status   Specimen  Description URINE, CLEAN CATCH  Final   Special Requests   Final    NONE Performed at [facility], [place] [address] [place], [place] [address]     Culture MULTIPLE SPECIES PRESENT, SUGGEST RECOLLECTION (A)  Final   Report Status 06/22/2021 FINAL  Final  Urine Culture     Status: Abnormal   Collection Time: 06/22/21  2:21 PM   Specimen: Urine, Clean Catch  Result Value Ref Range Status   Specimen Description URINE, CLEAN CATCH  Final   Special Requests NONE  Final   Culture (A)  Final    <10,000 COLONIES/mL INSIGNIFICANT GROWTH Performed at [facility], [place] [address], [place], [place] [address]    Report Status 06/24/2021 FINAL  Final         Radiology Studies: No results found.      Scheduled Meds:  diclofenac Sodium  2 g Topical QID   enoxaparin (LOVENOX) injection  30 mg Subcutaneous Q24H   feeding supplement (GLUCERNA SHAKE)  237 mL Oral TID BM   insulin aspart  0-20 Units Subcutaneous TID WC   insulin aspart  0-5 Units Subcutaneous QHS   insulin aspart  20 Units Subcutaneous TID WC   insulin glargine-yfgn  45 Units Subcutaneous Daily   isosorbide mononitrate  120 mg Oral Daily   levothyroxine  50 mcg Oral Q0600   linagliptin  5 mg Oral Daily   metoprolol succinate  50 mg Oral Daily   multivitamin  1 tablet Oral QHS   nicotine  21 mg Transdermal Daily   pantoprazole  40 mg Oral Daily   QUEtiapine  50 mg Oral QHS   rosuvastatin  20 mg Oral Daily   ticagrelor  90 mg Oral BID   Continuous Infusions:     LOS: 6 days    Time spent: 25 minutes    Geradine Girt, DO [facility]  If 7PM-7AM, please contact night-coverage [email or web address] 06/27/2021, 12:50 PM

## 2021-06-28 LAB — GLUCOSE, CAPILLARY
Glucose-Capillary: 199 mg/dL — ABNORMAL HIGH (ref 70–99)
Glucose-Capillary: 250 mg/dL — ABNORMAL HIGH (ref 70–99)
Glucose-Capillary: 285 mg/dL — ABNORMAL HIGH (ref 70–99)
Glucose-Capillary: 407 mg/dL — ABNORMAL HIGH (ref 70–99)
Glucose-Capillary: 429 mg/dL — ABNORMAL HIGH (ref 70–99)

## 2021-06-28 LAB — CBC
HCT: 27.3 % — ABNORMAL LOW (ref 36.0–46.0)
Hemoglobin: 8.7 g/dL — ABNORMAL LOW (ref 12.0–15.0)
MCH: 28.8 pg (ref 26.0–34.0)
MCHC: 31.9 g/dL (ref 30.0–36.0)
MCV: 90.4 fL (ref 80.0–100.0)
Platelets: 200 10*3/uL (ref 150–400)
RBC: 3.02 MIL/uL — ABNORMAL LOW (ref 3.87–5.11)
RDW: 14.4 % (ref 11.5–15.5)
WBC: 7.5 10*3/uL (ref 4.0–10.5)
nRBC: 0 % (ref 0.0–0.2)

## 2021-06-28 LAB — BASIC METABOLIC PANEL
Anion gap: 5 (ref 5–15)
BUN: 35 mg/dL — ABNORMAL HIGH (ref 8–23)
CO2: 23 mmol/L (ref 22–32)
Calcium: 9.6 mg/dL (ref 8.9–10.3)
Chloride: 107 mmol/L (ref 98–111)
Creatinine, Ser: 1.58 mg/dL — ABNORMAL HIGH (ref 0.44–1.00)
GFR, Estimated: 34 mL/min — ABNORMAL LOW (ref >60)
Glucose, Bld: 233 mg/dL — ABNORMAL HIGH (ref 70–99)
Potassium: 4.8 mmol/L (ref 3.5–5.1)
Sodium: 135 mmol/L (ref 135–145)

## 2021-06-28 LAB — VITAMIN B12: Vitamin B-12: 1442 pg/mL — ABNORMAL HIGH (ref 180–914)

## 2021-06-28 MED ORDER — INSULIN GLARGINE-YFGN 100 UNIT/ML ~~LOC~~ SOLN
50.0000 [IU] | Freq: Every day | SUBCUTANEOUS | Status: DC
  Administered 2021-06-29 – 2021-06-30 (×2): 50 [IU] via SUBCUTANEOUS
  Filled 2021-06-28 (×2): qty 0.5

## 2021-06-28 MED ORDER — INSULIN ASPART 100 UNIT/ML IJ SOLN: 10.0000 [IU] | Freq: Once | INTRAMUSCULAR | Status: DC

## 2021-06-28 MED ORDER — METOPROLOL SUCCINATE ER 100 MG PO TB24
100.0000 mg | ORAL_TABLET | Freq: Every day | ORAL | Status: DC
  Administered 2021-06-29: 100 mg via ORAL
  Filled 2021-06-28: qty 1

## 2021-06-28 NOTE — Progress Notes (Signed)
PROGRESS NOTE    Tonya Richard  HAL:[id number] DOB: [birth year] DOA: 06/20/2021 PCP: Penelope Coop, FNP   Brief Narrative:   Tonya Richard is a 75 y.o. female with history of diabetes mellitus type 2, CAD status post CABG, peripheral vascular disease, chronic kidney disease stage IV, chronic anemia was brought to the ER after patient's daughter found that patient was getting increasingly confused over the last 3 days has been having frequent falls and blood sugar has been running high.  Patient daughter states that she was admitted a similar situation in March of this year at [facility] and patient had very high blood sugar eventually was discharged to rehab and after discharge patient's daughter has been living with her.  Initially she was very compliant with the medication but last few weeks she has been very resistant to taking medications.  Hemoglobin A1c was around 14 2 weeks ago as per the patient's daughter.  Patient was found to have HHS, started on IV insulin.   Assessment & Plan:   Active Problems:   Hypothyroidism   Essential hypertension, benign   COPD (chronic obstructive pulmonary disease) (HCC)   Hx of CABG   Hyperosmolar hyperglycemic state (HHS) ([place])   Acute delirium   Hyperosmolar non-ketotic state due to type 2 diabetes mellitus (HCC)   Malnutrition of moderate degree   HHS, and periods of hypoglycemia -Appreciate diabetes coordinator recommendations -added long acting and SSI-- adjust for better control -Hemoglobin A1c noted to be greater than 15.5% on 7/23, she will require endocrinology referral once stable enough for discharge -Daughter states she's very noncompliant with diet and medication intake  Acute metabolic encephalopathy -Thought to be secondary to above -CT head without acute intracranial abnormality, there is a remote right occipital infarction with corresponding encephalomalacia -appears to be at her Baseline   Coronary artery disease  status post CABG -resume home meds   UTI -treated d/c abx  CKD stage IV -Stable  Anemia -suspect some ABLA from large bruise on arm but also could be from multiple lab draws -check stools  Hypothyroidism -Continue Synthroid -TSH 3.81 and T4 1.03  Spoke with daughter 7/27  DVT prophylaxis:Lovenox Code Status: DNR Disposition Plan:  Status is: Inpatient  Remains inpatient appropriate because:Altered mental status, IV treatments appropriate due to intensity of illness or inability to take PO, and Inpatient level of care appropriate due to severity of illness  Dispo: The patient is from: Home              Anticipated d/c is to: sNF?              Patient currently is not medically stable to d/c. Await better BS Control   Difficult to place patient No   Consultants:  None    Antimicrobials:  Anti-infectives (From admission, onward)    Start     Dose/Rate Route Frequency Ordered Stop   06/21/21 2000  cefTRIAXone (ROCEPHIN) 1 g in sodium chloride 0.9 % 100 mL IVPB  Status:  Discontinued        1 g 200 mL/hr over 30 Minutes Intravenous Every 24 hours 06/20/21 2106 06/24/21 1201   06/20/21 1915  cefTRIAXone (ROCEPHIN) 1 g in sodium chloride 0.9 % 100 mL IVPB        1 g 200 mL/hr over 30 Minutes Intravenous  Once 06/20/21 1903 06/20/21 2127       Subjective: C/o being tired today  Objective: Vitals:   06/27/21 1600 06/27/21 1944 06/27/21  2323 06/28/21 0730  BP: 122/70 (!) 120/53 (!) 151/57 (!) 145/77  Pulse:  90 91 89  Resp:  19 17 18   Temp: 98 F (36.7 C) 97.8 F (36.6 C) 97.7 F (36.5 C) (!) 97.5 F (36.4 C)  TempSrc: Oral Oral Oral Oral  SpO2: 98% 97% 95% 96%  Weight:      Height:        Intake/Output Summary (Last 24 hours) at 06/28/2021 1050 Last data filed at 06/28/2021 0437 Gross per 24 hour  Intake 480 ml  Output 900 ml  Net -420 ml   Filed Weights   06/20/21 1501 06/20/21 2348  Weight: 66.2 kg 65.8 kg    Examination:   General:  Appearance:     Overweight female in no acute distress     Lungs:     respirations unlabored  Heart:    Normal heart rate.    MS:   All extremities are intact.    Neurologic:   Awake, pleasant and cooperative       Data Reviewed: I have personally reviewed following labs and imaging studies  CBC: Recent Labs  Lab 06/22/21 0126 06/23/21 0150 06/27/21 0538 06/28/21 0201  WBC 10.3 6.7 7.2 7.5  HGB 9.4* 8.3* 7.9* 8.7*  HCT 28.9* 25.9* 25.2* 27.3*  MCV 87.0 88.4 89.7 90.4  PLT 185 157 172 793   Basic Metabolic Panel: Recent Labs  Lab 06/22/21 0126 06/23/21 0150 06/23/21 1246 06/23/21 2305 06/24/21 1240 06/24/21 1653 06/27/21 0538 06/28/21 0201  NA 142 141  --  134*  --   --  136 135  K 3.6 3.4*  --  4.5  --   --  4.5 4.8  CL 113* 115*  --  108  --   --  106 107  CO2 24 20*  --  22  --   --  24 23  GLUCOSE 94 205*   < > 418* 611* 448* 194* 233*  BUN 15 13  --  26*  --   --  37* 35*  CREATININE 1.50* 1.51*  --  1.70*  --   --  1.71* 1.58*  CALCIUM 9.8 9.1  --  9.0  --   --  9.7 9.6  MG  --  1.9  --   --   --   --   --   --    < > = values in this interval not displayed.   GFR: Estimated Creatinine Clearance: 27.5 mL/min (A) (by C-G formula based on SCr of 1.58 mg/dL (H)). Liver Function Tests: No results for input(s): AST, ALT, ALKPHOS, BILITOT, PROT, ALBUMIN in the last 168 hours.  No results for input(s): LIPASE, AMYLASE in the last 168 hours. No results for input(s): AMMONIA in the last 168 hours. Coagulation Profile: No results for input(s): INR, PROTIME in the last 168 hours. Cardiac Enzymes: No results for input(s): CKTOTAL, CKMB, CKMBINDEX, TROPONINI in the last 168 hours. BNP (last 3 results) No results for input(s): PROBNP in the last 8760 hours. HbA1C: No results for input(s): HGBA1C in the last 72 hours.  CBG: Recent Labs  Lab 06/27/21 1211 06/27/21 1732 06/27/21 2136 06/27/21 2210 06/28/21 0621  GLUCAP 312* 391* 402* 392* 199*   Lipid  Profile: No results for input(s): CHOL, HDL, LDLCALC, TRIG, CHOLHDL, LDLDIRECT in the last 72 hours. Thyroid Function Tests: No results for input(s): TSH, T4TOTAL, FREET4, T3FREE, THYROIDAB in the last 72 hours.  Anemia Panel: Recent Labs  06/28/21 0201  VITAMINB12 1,442*   Sepsis Labs: No results for input(s): PROCALCITON, LATICACIDVEN in the last 168 hours.  Recent Results (from the past 240 hour(s))  Resp Panel by RT-PCR (Flu A&B, Covid) Nasopharyngeal Swab     Status: None   Collection Time: 06/20/21  4:29 PM   Specimen: Nasopharyngeal Swab; Nasopharyngeal(NP) swabs in vial transport medium  Result Value Ref Range Status   SARS Coronavirus 2 by RT PCR NEGATIVE NEGATIVE Final    Comment: (NOTE) SARS-CoV-2 target nucleic acids are NOT DETECTED.  The SARS-CoV-2 RNA is generally detectable in upper respiratory specimens during the acute phase of infection. The lowest concentration of SARS-CoV-2 viral copies this assay can detect is 138 copies/mL. A negative result does not preclude SARS-Cov-2 infection and should not be used as the sole basis for treatment or other patient management decisions. A negative result may occur with  improper specimen collection/handling, submission of specimen other than nasopharyngeal swab, presence of viral mutation(s) within the areas targeted by this assay, and inadequate number of viral copies(<138 copies/mL). A negative result must be combined with clinical observations, patient history, and epidemiological information. The expected result is Negative.  Fact Sheet for Patients:  EntrepreneurPulse.com.au  Fact Sheet for Healthcare Providers:  IncredibleEmployment.be  This test is no t yet approved or cleared by the [place] FDA and  has been authorized for detection and/or diagnosis of SARS-CoV-2 by FDA under an Emergency Use Authorization (EUA). This EUA will remain  in effect (meaning this test  can be used) for the duration of the COVID-19 declaration under Section 564(b)(1) of the Act, 21 U.S.C.section 360bbb-3(b)(1), unless the authorization is terminated  or revoked sooner.       Influenza A by PCR NEGATIVE NEGATIVE Final   Influenza B by PCR NEGATIVE NEGATIVE Final    Comment: (NOTE) The Xpert Xpress SARS-CoV-2/FLU/RSV plus assay is intended as an aid in the diagnosis of influenza from Nasopharyngeal swab specimens and should not be used as a sole basis for treatment. Nasal washings and aspirates are unacceptable for Xpert Xpress SARS-CoV-2/FLU/RSV testing.  Fact Sheet for Patients: EntrepreneurPulse.com.au  Fact Sheet for Healthcare Providers: IncredibleEmployment.be  This test is not yet approved or cleared by the [place] FDA and has been authorized for detection and/or diagnosis of SARS-CoV-2 by FDA under an Emergency Use Authorization (EUA). This EUA will remain in effect (meaning this test can be used) for the duration of the COVID-19 declaration under Section 564(b)(1) of the Act, 21 U.S.C. section 360bbb-3(b)(1), unless the authorization is terminated or revoked.  Performed at [facility], [place] [address], [place], [place] [address]   Urine Culture     Status: Abnormal   Collection Time: 06/20/21  7:04 PM   Specimen: Urine, Clean Catch  Result Value Ref Range Status   Specimen Description URINE, CLEAN CATCH  Final   Special Requests   Final    NONE Performed at [facility], [place] [address] [place], [place] [address]    Culture MULTIPLE SPECIES PRESENT, SUGGEST RECOLLECTION (A)  Final   Report Status 06/22/2021 FINAL  Final  Urine Culture     Status: Abnormal   Collection Time: 06/22/21  2:21 PM   Specimen: Urine, Clean Catch  Result Value Ref Range Status   Specimen Description URINE, CLEAN CATCH  Final   Special Requests NONE  Final   Culture (A)  Final    <10,000 COLONIES/mL INSIGNIFICANT  GROWTH Performed at [facility], [address], [place], [place] [address]    Report Status 06/24/2021 FINAL  Final         Radiology Studies: No results found.      Scheduled Meds:  diclofenac Sodium  2 g Topical QID   enoxaparin (LOVENOX) injection  30 mg Subcutaneous Q24H   feeding supplement (GLUCERNA SHAKE)  237 mL Oral TID BM   insulin aspart  0-20 Units Subcutaneous TID WC   insulin aspart  0-5 Units Subcutaneous QHS   insulin aspart  20 Units Subcutaneous TID WC   [START ON 06/29/2021] insulin glargine-yfgn  50 Units Subcutaneous Daily   isosorbide mononitrate  120 mg Oral Daily   levothyroxine  50 mcg Oral Q0600   linagliptin  5 mg Oral Daily   [START ON 06/29/2021] metoprolol succinate  100 mg Oral Daily   multivitamin  1 tablet Oral QHS   nicotine  21 mg Transdermal Daily   pantoprazole  40 mg Oral Daily   QUEtiapine  50 mg Oral QHS   rosuvastatin  20 mg Oral Daily   ticagrelor  90 mg Oral BID   Continuous Infusions:     LOS: 7 days    Time spent: 25 minutes    Geradine Girt, DO [facility]  If 7PM-7AM, please contact night-coverage [email or web address] 06/28/2021, 10:50 AM

## 2021-06-29 LAB — SARS CORONAVIRUS 2 (TAT 6-24 HRS): SARS Coronavirus 2: NEGATIVE

## 2021-06-29 LAB — GLUCOSE, CAPILLARY
Glucose-Capillary: 125 mg/dL — ABNORMAL HIGH (ref 70–99)
Glucose-Capillary: 175 mg/dL — ABNORMAL HIGH (ref 70–99)
Glucose-Capillary: 301 mg/dL — ABNORMAL HIGH (ref 70–99)
Glucose-Capillary: 324 mg/dL — ABNORMAL HIGH (ref 70–99)
Glucose-Capillary: 396 mg/dL — ABNORMAL HIGH (ref 70–99)

## 2021-06-29 MED ORDER — INSULIN ASPART 100 UNIT/ML IJ SOLN
30.0000 [IU] | Freq: Three times a day (TID) | INTRAMUSCULAR | Status: DC
  Administered 2021-06-29 – 2021-06-30 (×5): 30 [IU] via SUBCUTANEOUS

## 2021-06-29 MED ORDER — METOPROLOL SUCCINATE ER 50 MG PO TB24
75.0000 mg | ORAL_TABLET | Freq: Every day | ORAL | Status: DC
  Administered 2021-06-30: 75 mg via ORAL
  Filled 2021-06-29: qty 1

## 2021-06-29 NOTE — TOC Progression Note (Signed)
Transition of Care [facility]) - Progression Note    Patient Details  Name: Tonya Richard MRN: [id number] Date of Birth: 1945/12/28  Transition of Care [facility]) CM/SW [place], [place] Phone Number: 06/29/2021, 4:15 PM  Clinical Narrative:     [facility] insurance authorization- CSW informed SNF anticipate d/c tomorrow.  CSW will continue to follow and assist with discharge planning.  Thurmond Butts, MSW, LCSW Clinical Social Worker    Expected Discharge Plan: Skilled Nursing Facility Barriers to Discharge: Continued Medical Work up, SNF Pending bed offer  Expected Discharge Plan and Services Expected Discharge Plan: [place] In-house Referral: Clinical Social Work     Living arrangements for the past 2 months: Single Family Home                                       Social Determinants of Health (SDOH) Interventions    Readmission Risk Interventions No flowsheet data found.

## 2021-06-29 NOTE — Care Management Important Message (Signed)
Important Message  Patient Details  Name: Tonya Richard MRN: [id number] Date of Birth: 03-17-1946   Medicare Important Message Given:  Yes     Shelda Altes 06/29/2021, 10:09 AM

## 2021-06-29 NOTE — TOC Progression Note (Signed)
Transition of Care [facility]) - Progression Note    Patient Details  Name: Tonya Richard MRN: [id number] Date of Birth: 1946-05-11  Transition of Care [facility]) CM/SW [place], [place] Phone Number: 06/29/2021, 11:14 AM  Clinical Narrative:     Patient has placement at [facility] remains pending  Thurmond Butts, MSW, LCSW Clinical Social Worker    Expected Discharge Plan: [place] Barriers to Discharge: Continued Medical Work up, SNF Pending bed offer  Expected Discharge Plan and Services Expected Discharge Plan: [place] In-house Referral: Clinical Social Work     Living arrangements for the past 2 months: Single Family Home                                       Social Determinants of Health (SDOH) Interventions    Readmission Risk Interventions No flowsheet data found.

## 2021-06-29 NOTE — Progress Notes (Signed)
PROGRESS NOTE    Tonya Richard  EXB:[id number] DOB: 07-Jun-1946 DOA: 06/20/2021 PCP: Penelope Coop, FNP   Brief Narrative:   Tonya Richard is a 75 y.o. female with history of diabetes mellitus type 2, CAD status post CABG, peripheral vascular disease, chronic kidney disease stage IV, chronic anemia was brought to the ER after patient's daughter found that patient was getting increasingly confused over the last 3 days has been having frequent falls and blood sugar has been running high.  Patient daughter states that she was admitted a similar situation in March of this year at [facility] and patient had very high blood sugar eventually was discharged to rehab and after discharge patient's daughter has been living with her.  Initially she was very compliant with the medication but last few weeks she has been very resistant to taking medications.  Hemoglobin A1c was around 14 2 weeks ago as per the patient's daughter.  Patient was found to have HHS, started on IV insulin.  Blood sugars have been difficult to control   Assessment & Plan:   Active Problems:   Hypothyroidism   Essential hypertension, benign   COPD (chronic obstructive pulmonary disease) (HCC)   Hx of CABG   Hyperosmolar hyperglycemic state (HHS) ([place])   Acute delirium   Hyperosmolar non-ketotic state due to type 2 diabetes mellitus (HCC)   Malnutrition of moderate degree   HHS, and periods of hypoglycemia -Appreciate diabetes coordinator recommendations -added long acting and SSI-- adjust for better control -Hemoglobin A1c noted to be greater than 15.5% on 7/23, she will require endocrinology referral once stable enough for discharge -Daughter states she's very noncompliant with diet and medication intake  Acute metabolic encephalopathy -Thought to be secondary to above -CT head without acute intracranial abnormality, there is a remote right occipital infarction with corresponding encephalomalacia -appears to  be at her Baseline   Coronary artery disease status post CABG -resume home meds   UTI -treated d/c abx  CKD stage IV -Stable  Anemia  -suspect some ABLA from large bruise on arm but also could be from multiple lab draws vs CKD -follow PRN  Hypothyroidism -Continue Synthroid -TSH 3.81 and T4 1.03  Spoke with daughter 7/27  DVT prophylaxis:Lovenox Code Status: DNR Disposition Plan:  Status is: Inpatient  Remains inpatient appropriate because:Altered mental status, IV treatments appropriate due to intensity of illness or inability to take PO, and Inpatient level of care appropriate due to severity of illness  Dispo: The patient is from: Home              Anticipated d/c is to: sNF?        Difficult to place patient No   Consultants:  None    Antimicrobials:  Anti-infectives (From admission, onward)    Start     Dose/Rate Route Frequency Ordered Stop   06/21/21 2000  cefTRIAXone (ROCEPHIN) 1 g in sodium chloride 0.9 % 100 mL IVPB  Status:  Discontinued        1 g 200 mL/hr over 30 Minutes Intravenous Every 24 hours 06/20/21 2106 06/24/21 1201   06/20/21 1915  cefTRIAXone (ROCEPHIN) 1 g in sodium chloride 0.9 % 100 mL IVPB        1 g 200 mL/hr over 30 Minutes Intravenous  Once 06/20/21 1903 06/20/21 2127       Subjective: C/o being tired today  Objective: Vitals:   06/28/21 2303 06/29/21 0001 06/29/21 0326 06/29/21 0720  BP: 122/62 122/62 (!) 126/50 Marland Kitchen)  108/48  Pulse: 97 90 97 78  Resp: 18 16 14 16   Temp: 98.1 F (36.7 C) 97.8 F (36.6 C) (!) 97.3 F (36.3 C) 97.9 F (36.6 C)  TempSrc: Oral Oral Oral Oral  SpO2: 98% 97% 99% 96%  Weight:   65.2 kg   Height:       No intake or output data in the 24 hours ending 06/29/21 1234  Filed Weights   06/20/21 1501 06/20/21 2348 06/29/21 0326  Weight: 66.2 kg 65.8 kg 65.2 kg    Examination:   General: Appearance:     Overweight female in no acute distress     Lungs:      respirations unlabored   Heart:    Normal heart rate.    MS:   All extremities are intact.    Neurologic:   Awake, alert       Data Reviewed: I have personally reviewed following labs and imaging studies  CBC: Recent Labs  Lab 06/23/21 0150 06/27/21 0538 06/28/21 0201  WBC 6.7 7.2 7.5  HGB 8.3* 7.9* 8.7*  HCT 25.9* 25.2* 27.3*  MCV 88.4 89.7 90.4  PLT 157 172 097   Basic Metabolic Panel: Recent Labs  Lab 06/23/21 0150 06/23/21 1246 06/23/21 2305 06/24/21 1240 06/24/21 1653 06/27/21 0538 06/28/21 0201  NA 141  --  134*  --   --  136 135  K 3.4*  --  4.5  --   --  4.5 4.8  CL 115*  --  108  --   --  106 107  CO2 20*  --  22  --   --  24 23  GLUCOSE 205*   < > 418* 611* 448* 194* 233*  BUN 13  --  26*  --   --  37* 35*  CREATININE 1.51*  --  1.70*  --   --  1.71* 1.58*  CALCIUM 9.1  --  9.0  --   --  9.7 9.6  MG 1.9  --   --   --   --   --   --    < > = values in this interval not displayed.   GFR: Estimated Creatinine Clearance: 27.4 mL/Richard (A) (by C-G formula based on SCr of 1.58 mg/dL (H)). Liver Function Tests: No results for input(s): AST, ALT, ALKPHOS, BILITOT, PROT, ALBUMIN in the last 168 hours.  No results for input(s): LIPASE, AMYLASE in the last 168 hours. No results for input(s): AMMONIA in the last 168 hours. Coagulation Profile: No results for input(s): INR, PROTIME in the last 168 hours. Cardiac Enzymes: No results for input(s): CKTOTAL, CKMB, CKMBINDEX, TROPONINI in the last 168 hours. BNP (last 3 results) No results for input(s): PROBNP in the last 8760 hours. HbA1C: No results for input(s): HGBA1C in the last 72 hours.  CBG: Recent Labs  Lab 06/28/21 2144 06/28/21 2329 06/29/21 0000 06/29/21 0657 06/29/21 1104  GLUCAP 407* 429* 396* 301* 175*   Lipid Profile: No results for input(s): CHOL, HDL, LDLCALC, TRIG, CHOLHDL, LDLDIRECT in the last 72 hours. Thyroid Function Tests: No results for input(s): TSH, T4TOTAL, FREET4, T3FREE, THYROIDAB in the last 72  hours.  Anemia Panel: Recent Labs    06/28/21 0201  VITAMINB12 1,442*   Sepsis Labs: No results for input(s): PROCALCITON, LATICACIDVEN in the last 168 hours.  Recent Results (from the past 240 hour(s))  Resp Panel by RT-PCR (Flu A&B, Covid) Nasopharyngeal Swab     Status: None   Collection  Time: 06/20/21  4:29 PM   Specimen: Nasopharyngeal Swab; Nasopharyngeal(NP) swabs in vial transport medium  Result Value Ref Range Status   SARS Coronavirus 2 by RT PCR NEGATIVE NEGATIVE Final    Comment: (NOTE) SARS-CoV-2 target nucleic acids are NOT DETECTED.  The SARS-CoV-2 RNA is generally detectable in upper respiratory specimens during the acute phase of infection. The lowest concentration of SARS-CoV-2 viral copies this assay can detect is 138 copies/mL. A negative result does not preclude SARS-Cov-2 infection and should not be used as the sole basis for treatment or other patient management decisions. A negative result may occur with  improper specimen collection/handling, submission of specimen other than nasopharyngeal swab, presence of viral mutation(s) within the areas targeted by this assay, and inadequate number of viral copies(<138 copies/mL). A negative result must be combined with clinical observations, patient history, and epidemiological information. The expected result is Negative.  Fact Sheet for Patients:  EntrepreneurPulse.com.au  Fact Sheet for Healthcare Providers:  IncredibleEmployment.be  This test is no t yet approved or cleared by the [place] FDA and  has been authorized for detection and/or diagnosis of SARS-CoV-2 by FDA under an Emergency Use Authorization (EUA). This EUA will remain  in effect (meaning this test can be used) for the duration of the COVID-19 declaration under Section 564(b)(1) of the Act, 21 U.S.C.section 360bbb-3(b)(1), unless the authorization is terminated  or revoked sooner.       Influenza  A by PCR NEGATIVE NEGATIVE Final   Influenza B by PCR NEGATIVE NEGATIVE Final    Comment: (NOTE) The Xpert Xpress SARS-CoV-2/FLU/RSV plus assay is intended as an aid in the diagnosis of influenza from Nasopharyngeal swab specimens and should not be used as a sole basis for treatment. Nasal washings and aspirates are unacceptable for Xpert Xpress SARS-CoV-2/FLU/RSV testing.  Fact Sheet for Patients: EntrepreneurPulse.com.au  Fact Sheet for Healthcare Providers: IncredibleEmployment.be  This test is not yet approved or cleared by the [place] FDA and has been authorized for detection and/or diagnosis of SARS-CoV-2 by FDA under an Emergency Use Authorization (EUA). This EUA will remain in effect (meaning this test can be used) for the duration of the COVID-19 declaration under Section 564(b)(1) of the Act, 21 U.S.C. section 360bbb-3(b)(1), unless the authorization is terminated or revoked.  Performed at [facility], [place] [address] [place], [place] [address]   Urine Culture     Status: Abnormal   Collection Time: 06/20/21  7:04 PM   Specimen: Urine, Clean Catch  Result Value Ref Range Status   Specimen Description URINE, CLEAN CATCH  Final   Special Requests   Final    NONE Performed at [facility], [place] [address] [place], [place] [address]    Culture MULTIPLE SPECIES PRESENT, SUGGEST RECOLLECTION (A)  Final   Report Status 06/22/2021 FINAL  Final  Urine Culture     Status: Abnormal   Collection Time: 06/22/21  2:21 PM   Specimen: Urine, Clean Catch  Result Value Ref Range Status   Specimen Description URINE, CLEAN CATCH  Final   Special Requests NONE  Final   Culture (A)  Final    <10,000 COLONIES/mL INSIGNIFICANT GROWTH Performed at [facility], [place] [address] [place], [place] [address]    Report Status 06/24/2021 FINAL  Final         Radiology Studies: No results found.      Scheduled Meds:   diclofenac Sodium  2 g Topical QID  enoxaparin (LOVENOX) injection  30 mg Subcutaneous Q24H   feeding supplement (GLUCERNA SHAKE)  237 mL Oral TID BM   insulin aspart  0-20 Units Subcutaneous TID WC   insulin aspart  0-5 Units Subcutaneous QHS   insulin aspart  10 Units Subcutaneous Once   insulin aspart  30 Units Subcutaneous TID WC   insulin glargine-yfgn  50 Units Subcutaneous Daily   isosorbide mononitrate  120 mg Oral Daily   levothyroxine  50 mcg Oral Q0600   linagliptin  5 mg Oral Daily   [START ON 06/30/2021] metoprolol succinate  75 mg Oral Daily   multivitamin  1 tablet Oral QHS   nicotine  21 mg Transdermal Daily   pantoprazole  40 mg Oral Daily   QUEtiapine  50 mg Oral QHS   rosuvastatin  20 mg Oral Daily   ticagrelor  90 mg Oral BID   Continuous Infusions:     LOS: 8 days    Time spent: 25 minutes    Tonya Girt, DO [facility]  If 7PM-7AM, please contact night-coverage [email or web address] 06/29/2021, 12:34 PM

## 2021-06-30 DIAGNOSIS — Z743 Need for continuous supervision: Secondary | ICD-10-CM | POA: Diagnosis not present

## 2021-06-30 DIAGNOSIS — G9341 Metabolic encephalopathy: Secondary | ICD-10-CM | POA: Diagnosis not present

## 2021-06-30 DIAGNOSIS — R69 Illness, unspecified: Secondary | ICD-10-CM | POA: Diagnosis not present

## 2021-06-30 DIAGNOSIS — I2510 Atherosclerotic heart disease of native coronary artery without angina pectoris: Secondary | ICD-10-CM | POA: Diagnosis not present

## 2021-06-30 DIAGNOSIS — I509 Heart failure, unspecified: Secondary | ICD-10-CM | POA: Diagnosis not present

## 2021-06-30 DIAGNOSIS — E1165 Type 2 diabetes mellitus with hyperglycemia: Secondary | ICD-10-CM | POA: Diagnosis not present

## 2021-06-30 DIAGNOSIS — J449 Chronic obstructive pulmonary disease, unspecified: Secondary | ICD-10-CM | POA: Diagnosis not present

## 2021-06-30 DIAGNOSIS — E119 Type 2 diabetes mellitus without complications: Secondary | ICD-10-CM | POA: Diagnosis not present

## 2021-06-30 DIAGNOSIS — I1310 Hypertensive heart and chronic kidney disease without heart failure, with stage 1 through stage 4 chronic kidney disease, or unspecified chronic kidney disease: Secondary | ICD-10-CM | POA: Diagnosis not present

## 2021-06-30 DIAGNOSIS — R410 Disorientation, unspecified: Secondary | ICD-10-CM | POA: Diagnosis not present

## 2021-06-30 DIAGNOSIS — R739 Hyperglycemia, unspecified: Secondary | ICD-10-CM | POA: Diagnosis not present

## 2021-06-30 DIAGNOSIS — E1122 Type 2 diabetes mellitus with diabetic chronic kidney disease: Secondary | ICD-10-CM | POA: Diagnosis not present

## 2021-06-30 DIAGNOSIS — R488 Other symbolic dysfunctions: Secondary | ICD-10-CM | POA: Diagnosis not present

## 2021-06-30 DIAGNOSIS — E785 Hyperlipidemia, unspecified: Secondary | ICD-10-CM | POA: Diagnosis not present

## 2021-06-30 DIAGNOSIS — I25708 Atherosclerosis of coronary artery bypass graft(s), unspecified, with other forms of angina pectoris: Secondary | ICD-10-CM | POA: Diagnosis not present

## 2021-06-30 DIAGNOSIS — E1301 Other specified diabetes mellitus with hyperosmolarity with coma: Secondary | ICD-10-CM | POA: Diagnosis not present

## 2021-06-30 DIAGNOSIS — E1100 Type 2 diabetes mellitus with hyperosmolarity without nonketotic hyperglycemic-hyperosmolar coma (NKHHC): Secondary | ICD-10-CM | POA: Diagnosis not present

## 2021-06-30 DIAGNOSIS — D631 Anemia in chronic kidney disease: Secondary | ICD-10-CM | POA: Diagnosis not present

## 2021-06-30 DIAGNOSIS — R531 Weakness: Secondary | ICD-10-CM | POA: Diagnosis not present

## 2021-06-30 DIAGNOSIS — E039 Hypothyroidism, unspecified: Secondary | ICD-10-CM | POA: Diagnosis not present

## 2021-06-30 DIAGNOSIS — N184 Chronic kidney disease, stage 4 (severe): Secondary | ICD-10-CM | POA: Diagnosis not present

## 2021-06-30 DIAGNOSIS — I10 Essential (primary) hypertension: Secondary | ICD-10-CM | POA: Diagnosis not present

## 2021-06-30 DIAGNOSIS — K219 Gastro-esophageal reflux disease without esophagitis: Secondary | ICD-10-CM | POA: Diagnosis not present

## 2021-06-30 LAB — GLUCOSE, CAPILLARY
Glucose-Capillary: 84 mg/dL (ref 70–99)
Glucose-Capillary: 130 mg/dL — ABNORMAL HIGH (ref 70–99)
Glucose-Capillary: 105 mg/dL — ABNORMAL HIGH (ref 70–99)

## 2021-06-30 MED ORDER — DICLOFENAC SODIUM 1 % EX GEL: 2.0000 g | Freq: Four times a day (QID) | CUTANEOUS | Status: DC

## 2021-06-30 MED ORDER — NICOTINE 21 MG/24HR TD PT24: 21.0000 mg | MEDICATED_PATCH | Freq: Every day | TRANSDERMAL | 0 refills | Status: DC

## 2021-06-30 MED ORDER — METOPROLOL SUCCINATE ER 25 MG PO TB24: 75.0000 mg | ORAL_TABLET | Freq: Every day | ORAL | Status: DC

## 2021-06-30 MED ORDER — RENA-VITE PO TABS: 1.0000 | ORAL_TABLET | Freq: Every day | ORAL | 0 refills | Status: DC

## 2021-06-30 MED ORDER — INSULIN GLARGINE-YFGN 100 UNIT/ML ~~LOC~~ SOLN: 50.0000 [IU] | Freq: Every day | SUBCUTANEOUS | 11 refills | Status: DC

## 2021-06-30 MED ORDER — INSULIN ASPART 100 UNIT/ML IJ SOLN: 20.0000 [IU] | Freq: Three times a day (TID) | INTRAMUSCULAR | 11 refills | Status: DC

## 2021-06-30 MED ORDER — LINAGLIPTIN 5 MG PO TABS: 5.0000 mg | ORAL_TABLET | Freq: Every day | ORAL | Status: DC

## 2021-06-30 MED ORDER — GLUCERNA SHAKE PO LIQD: 237.0000 mL | Freq: Three times a day (TID) | ORAL | 0 refills | Status: DC

## 2021-06-30 MED ORDER — INSULIN ASPART 100 UNIT/ML IJ SOLN: 0.0000 [IU] | Freq: Every day | INTRAMUSCULAR | 11 refills | Status: AC

## 2021-06-30 MED ORDER — INSULIN ASPART 100 UNIT/ML IJ SOLN: 0.0000 [IU] | Freq: Three times a day (TID) | INTRAMUSCULAR | 11 refills | Status: AC

## 2021-06-30 NOTE — Progress Notes (Signed)
Occupational Therapy Treatment Patient Details Name: Tonya Richard MRN: [id number] DOB: [birth year] Today's Date: 06/30/2021    History of present illness 75 y/o admitted 7/23 for hyperosmolar hypoglycemic state 2/2 T2DM. PMHx: T2DM, CAD s/p CABG, PVD, CKD stage IV, and chronic anemia.   OT comments  Pt able to progress BSC transfers, toileting tasks and short mobility during ADLs using Rollator this AM with min guard at most. Pt continues to be limited by L shoulder pain during activities and need for safety cues with DME use. Encouraged continued ROM exercises within tolerance and supportive positioning when in bed/recliner. Plan to progress L UE HEP and standing tolerance during ADLs in next session. Continue to recommend SNF rehab to maximize safety as pt endorsed repeated falls at home.    Follow Up Recommendations  SNF;Supervision - Intermittent    Equipment Recommendations  Other (comment) (defer to next venue)    Recommendations for Other Services      Precautions / Restrictions Precautions Precautions: Fall Precaution Comments: incontinence, L shoulder pain Restrictions Weight Bearing Restrictions: No LUE Weight Bearing: Weight bearing as tolerated       Mobility Bed Mobility Overal bed mobility: Needs Assistance Bed Mobility: Supine to Sit     Supine to sit: Supervision;HOB elevated     General bed mobility comments: increased time    Transfers Overall transfer level: Needs assistance Equipment used: 4-wheeled walker;None Transfers: Sit to/from [employer] to Stand: Min guard Stand pivot transfers: Min guard       General transfer comment: min guard for pivot to/from [facility] with pt reaching to armrests. min guard for sit to stand from bedside for mobility to chair using Rollator. Cues needed for rollator brake use    Balance Overall balance assessment: Needs assistance Sitting-balance support: No upper extremity supported;Feet  supported Sitting balance-Leahy Scale: Good     Standing balance support: Single extremity supported;Bilateral upper extremity supported Standing balance-Leahy Scale: Poor Standing balance comment: reliant on at least one UE support in standing                           ADL either performed or assessed with clinical judgement   ADL Overall ADL's : Needs assistance/impaired     Grooming: Set up;Sitting;Wash/dry face                   Toilet Transfer: Min Statistician Details (indicate cue type and reason): via reaching for armrests Toileting- Clothing Manipulation and Hygiene: Min guard;Sit to/from stand Toileting - Clothing Manipulation Details (indicate cue type and reason): min guard for safety/balance. pt able to perform peri care, mgmt of clothing without extensive assist     Functional mobility during ADLs: Min guard General ADL Comments: Pt with continued deficits of L shoulder pain during ADLs/mobility but able to progress transfers, toileting tasks and short mobility around foot of bed to chair. Cues needed throughout for Rollator mgmt     Vision   Vision Assessment?: No apparent visual deficits   Perception     Praxis      Cognition Arousal/Alertness: Awake/alert Behavior During Therapy: WFL for tasks assessed/performed Overall Cognitive Status: No family/caregiver present to determine baseline cognitive functioning                                 General Comments: follows directions majority of time, difficulty with Rollator  brake use. Tangential speech though pleasant and participatory        Exercises     Shoulder Instructions       General Comments HR 94 with activity    Pertinent Vitals/ Pain       Pain Assessment: Faces Faces Pain Scale: Hurts little more Pain Location: L shoulder Pain Descriptors / Indicators: Grimacing;Guarding;Sore Pain Intervention(s): Monitored during session  Home  Living                                          Prior Functioning/Environment              Frequency  Min 2X/week        Progress Toward Goals  OT Goals(current goals can now be found in the care plan section)  Progress towards OT goals: Progressing toward goals  Acute Rehab OT Goals Patient Stated Goal: Return home OT Goal Formulation: With patient Time For Goal Achievement: 07/08/21 Potential to Achieve Goals: Good ADL Goals Pt Will Perform Grooming: with supervision;with set-up;sitting Pt Will Perform Upper Body Bathing: with min assist;sitting Pt Will Perform Lower Body Bathing: with mod assist;sitting/lateral leans Pt Will Perform Upper Body Dressing: with min assist;sitting Pt Will Transfer to Toilet: with min assist;ambulating;regular height toilet;bedside commode Additional ADL Goal #1: Pt will use L UE with 1-2 verbal cues to assist with ADLs/selfcare  Plan Discharge plan remains appropriate    Co-evaluation                 AM-PAC OT "6 Clicks" Daily Activity     Outcome Measure   Help from another person eating meals?: A Little Help from another person taking care of personal grooming?: A Little Help from another person toileting, which includes using toliet, bedpan, or urinal?: A Little Help from another person bathing (including washing, rinsing, drying)?: A Lot Help from another person to put on and taking off regular upper body clothing?: A Little Help from another person to put on and taking off regular lower body clothing?: A Lot 6 Click Score: 16    End of Session Equipment Utilized During Treatment: Other (comment) (Rollator)  OT Visit Diagnosis: Unsteadiness on feet (R26.81);Other abnormalities of gait and mobility (R26.89);History of falling (Z91.81);Muscle weakness (generalized) (M62.81);Pain Pain - Right/Left: Left Pain - part of body: Shoulder;Arm;Hand   Activity Tolerance Patient tolerated treatment well    Patient Left in chair;with call bell/phone within reach;with chair alarm set   Nurse Communication Mobility status;Other (comment) (request for coffee, BSC use)        Time: 2595-6387 OT Time Calculation (min): 20 min  Charges: OT General Charges $OT Visit: 1 Visit OT Treatments $Self Care/Home Management : 8-22 mins  Malachy Chamber, OTR/L Acute Rehab Services Office: [phone]    Layla Maw 06/30/2021, 8:33 AM

## 2021-06-30 NOTE — Progress Notes (Signed)
Physical Therapy Treatment Patient Details Name: Tonya Richard MRN: [id number] DOB: [birth year] Today's Date: 06/30/2021    History of Present Illness 75 y/o admitted 7/23 for hyperosmolar hypoglycemic state 2/2 T2DM. PMHx: T2DM, CAD s/p CABG, PVD, CKD stage IV, and chronic anemia.    PT Comments    Pt with steady progress with mobility. Pt did better using rollator like she uses at home. Continue to recommend SNF for further rehab prior to return home.    Follow Up Recommendations  SNF     Equipment Recommendations  None recommended by PT    Recommendations for Other Services       Precautions / Restrictions Precautions Precautions: Fall Precaution Comments: lt shoulder pain Restrictions LUE Weight Bearing: Weight bearing as tolerated    Mobility  Bed Mobility Overal bed mobility: Needs Assistance Bed Mobility: Sit to Sidelying         Sit to sidelying: Supervision      Transfers Overall transfer level: Needs assistance Equipment used: 4-wheeled walker;None Transfers: Sit to/from [employer] to Stand: Min guard Stand pivot transfers: Min guard       General transfer comment: Assist for safety. Chair to bsc with stand pivot without device.  Ambulation/Gait Ambulation/Gait assistance: Min guard Gait Distance (Feet): 120 Feet Assistive device: 4-wheeled walker Gait Pattern/deviations: Step-through pattern;Decreased stride length;Trunk flexed Gait velocity: decr Gait velocity interpretation: <1.31 ft/sec, indicative of household ambulator General Gait Details: Assist for safety. Pt with improved distance using rollator   Stairs             Wheelchair Mobility    Modified Rankin (Stroke Patients Only)       Balance Overall balance assessment: Needs assistance Sitting-balance support: No upper extremity supported;Feet supported Sitting balance-Leahy Scale: Good     Standing balance support: Single extremity  supported;Bilateral upper extremity supported Standing balance-Leahy Scale: Poor Standing balance comment: UE support for static standing                            Cognition Arousal/Alertness: Awake/alert Behavior During Therapy: WFL for tasks assessed/performed Overall Cognitive Status: No family/caregiver present to determine baseline cognitive functioning                                 General Comments: difficulty with rollator brake use. Tangential speech at times      Exercises      General Comments        Pertinent Vitals/Pain Pain Assessment: Faces Faces Pain Scale: Hurts little more Pain Location: L shoulder Pain Descriptors / Indicators: Grimacing;Guarding;Sore Pain Intervention(s): Limited activity within patient's tolerance;Repositioned    Home Living                      Prior Function            PT Goals (current goals can now be found in the care plan section) Acute Rehab PT Goals Patient Stated Goal: Return home PT Goal Formulation: With patient Time For Goal Achievement: 07/07/21 Potential to Achieve Goals: Good Progress towards PT goals: Progressing toward goals;Goals met and updated - see care plan    Frequency    Min 2X/week      PT Plan Current plan remains appropriate    Co-evaluation              AM-PAC PT "6 Clicks"  Mobility   Outcome Measure  Help needed turning from your back to your side while in a flat bed without using bedrails?: None Help needed moving from lying on your back to sitting on the side of a flat bed without using bedrails?: A Little Help needed moving to and from a bed to a chair (including a wheelchair)?: A Little Help needed standing up from a chair using your arms (e.g., wheelchair or bedside chair)?: A Little Help needed to walk in hospital room?: A Little Help needed climbing 3-5 steps with a railing? : A Lot 6 Click Score: 18    End of Session Equipment Utilized  During Treatment: Gait belt Activity Tolerance: Patient tolerated treatment well Patient left: in bed;with call bell/phone within reach;with bed alarm set;with nursing/sitter in room Nurse Communication: Mobility status PT Visit Diagnosis: Unsteadiness on feet (R26.81);Muscle weakness (generalized) (M62.81)     Time: 5916-3846 PT Time Calculation (min) (ACUTE ONLY): 23 min  Charges:  $Gait Training: 23-37 mins                     [place] Pager [phone] Office [place] 06/30/2021, 12:53 PM

## 2021-06-30 NOTE — Discharge Summary (Signed)
Physician Discharge Summary  Tonya Richard TDV:[id number] DOB: August 05, 1946 DOA: 06/20/2021  PCP: Tonya Coop, FNP  Admit date: 06/20/2021 Discharge date: 06/30/2021  Admitted From: home Discharge disposition: SNF   Recommendations for Outpatient Follow-Up:   Will need titration of insulin to avoid hypoglycemia: monitor blood sugars closely Also will need titration of BP meds Bmp/cbc 1 week   Discharge Diagnosis:   Active Problems:   Hypothyroidism   Essential hypertension, benign   COPD (chronic obstructive pulmonary disease) (HCC)   Hx of CABG   Hyperosmolar hyperglycemic state (HHS) ([place])   Acute delirium   Hyperosmolar non-ketotic state due to type 2 diabetes mellitus ([place])   Malnutrition of moderate degree    Discharge Condition: Improved.  Diet recommendation: Low sodium, heart healthy.  Carbohydrate-modified.  Regular.  Wound care: None.  Code status: Full.   History of Present Illness:   Tonya Richard is a 75 y.o. female with history of diabetes mellitus type 2, CAD status post CABG, peripheral vascular disease, chronic kidney disease stage IV, chronic anemia was brought to the ER after patient's daughter found that patient was getting increasingly confused over the last 3 days has been having frequent falls and blood sugar has been running high.  Patient daughter states that she was admitted a similar situation in March of this year at [facility] and patient had very high blood sugar eventually was discharged to rehab and after discharge patient's daughter has been living with her.  Initially she was very compliant with the medication but last few weeks he has been very resistant to taking medications.  Hemoglobin A1c last 1 was around 14 2 weeks ago as per the patient's daughter.  Last 3 days patient has been getting more confused having falls but did not have any chest pain or shortness of breath nausea vomiting diarrhea fever or chills.  This  morning patient did get her Basaglar insulin which was administered by patient's daughter.  But last few days she has not taken any of her medications.  Since she was getting more confused EMS was called and patient was brought to the ER.   Hospital Course by Problem:   HHS, and periods of hypoglycemia -Appreciate diabetes coordinator recommendations -added long acting and SSI-- adjust for better control -Hemoglobin A1c noted to be greater than 15.5% on 7/23, she will require endocrinology referral once stable enough for discharge -Daughter states she's very noncompliant with diet and medication intake  Acute metabolic encephalopathy -Thought to be secondary to above -CT head without acute intracranial abnormality, there is a remote right occipital infarction with corresponding encephalomalacia -appears to be at her Baseline   Coronary artery disease status post CABG -resume home meds   UTI -treated- nothing grew on culture  CKD stage IV -Stable   Anemia -cbc 1 week  Hypothyroidism -Continue Synthroid  Left shoulder pain -x ray negative, voltaren gel   Medical Consultants:      Discharge Exam:   Vitals:   06/30/21 0746 06/30/21 1135  BP: (!) 148/81 (!) 155/89  Pulse: 86 85  Resp: 18 18  Temp: 97.7 F (36.5 C) (!) 97.4 F (36.3 C)  SpO2: 99% 100%   Vitals:   06/30/21 0102 06/30/21 0416 06/30/21 0746 06/30/21 1135  BP: (!) 95/58 (!) 142/81 (!) 148/81 (!) 155/89  Pulse: 86 82 86 85  Resp: 20 18 18 18   Temp:  97.6 F (36.4 C) 97.7 F (36.5 C) (!) 97.4 F (  36.3 C)  TempSrc:  Oral Oral Oral  SpO2:  99% 99% 100%  Weight:  65.3 kg    Height:        General exam: Appears calm and comfortable.    The results of significant diagnostics from this hospitalization (including imaging, microbiology, ancillary and laboratory) are listed below for reference.     Procedures and Diagnostic Studies:   CT Head Wo Contrast  Result Date: 06/20/2021 CLINICAL DATA:   Head trauma, minor (Age >= 65y); Neck trauma (Age >= 65y) EXAM: CT HEAD WITHOUT CONTRAST CT CERVICAL SPINE WITHOUT CONTRAST TECHNIQUE: Multidetector CT imaging of the head and cervical spine was performed following the standard protocol without intravenous contrast. Multiplanar CT image reconstructions of the cervical spine were also generated. COMPARISON:  Apr 21, 2020 FINDINGS: CT HEAD FINDINGS Brain: No evidence of acute infarction, hemorrhage, hydrocephalus, extra-axial collection or mass lesion/mass effect. Periventricular white matter hypodensities consistent with sequela of chronic microvascular ischemic disease. Remote RIGHT basal ganglia lacunar infarction. Encephalomalacia of the RIGHT occipital lobe consistent with remote prior infarction; degree of encephalomalacia is greater than that seen on diffusion weighted imaging from Apr 21, 2020. Advanced global parenchymal volume loss for age. Vascular: Vascular calcifications. Skull: Normal. Negative for fracture or focal lesion. Sinuses/Orbits: No acute finding. Other: None. CT CERVICAL SPINE FINDINGS Alignment: Normal. Skull base and vertebrae: No acute fracture. No primary bone lesion or focal pathologic process. Soft tissues and spinal canal: No prevertebral fluid or swelling. No visible canal hematoma. Disc levels: Mild intervertebral disc space height loss with posterior disc osteophyte complex at C4-5. Multilevel facet arthropathy without high-grade neural foraminal narrowing. Upper chest: Negative. Other: Atherosclerotic calcifications of the carotid bulbs. IMPRESSION: 1. No acute intracranial abnormality. There is a remote RIGHT occipital infarction with corresponding encephalomalacia, increased since Apr 21, 2020. 2.  No acute fracture or static subluxation of the cervical spine. Electronically Signed   By: Valentino Saxon MD   On: 06/20/2021 19:08   CT Cervical Spine Wo Contrast  Result Date: 06/20/2021 CLINICAL DATA:  Head trauma, minor (Age  >= 65y); Neck trauma (Age >= 65y) EXAM: CT HEAD WITHOUT CONTRAST CT CERVICAL SPINE WITHOUT CONTRAST TECHNIQUE: Multidetector CT imaging of the head and cervical spine was performed following the standard protocol without intravenous contrast. Multiplanar CT image reconstructions of the cervical spine were also generated. COMPARISON:  Apr 21, 2020 FINDINGS: CT HEAD FINDINGS Brain: No evidence of acute infarction, hemorrhage, hydrocephalus, extra-axial collection or mass lesion/mass effect. Periventricular white matter hypodensities consistent with sequela of chronic microvascular ischemic disease. Remote RIGHT basal ganglia lacunar infarction. Encephalomalacia of the RIGHT occipital lobe consistent with remote prior infarction; degree of encephalomalacia is greater than that seen on diffusion weighted imaging from Apr 21, 2020. Advanced global parenchymal volume loss for age. Vascular: Vascular calcifications. Skull: Normal. Negative for fracture or focal lesion. Sinuses/Orbits: No acute finding. Other: None. CT CERVICAL SPINE FINDINGS Alignment: Normal. Skull base and vertebrae: No acute fracture. No primary bone lesion or focal pathologic process. Soft tissues and spinal canal: No prevertebral fluid or swelling. No visible canal hematoma. Disc levels: Mild intervertebral disc space height loss with posterior disc osteophyte complex at C4-5. Multilevel facet arthropathy without high-grade neural foraminal narrowing. Upper chest: Negative. Other: Atherosclerotic calcifications of the carotid bulbs. IMPRESSION: 1. No acute intracranial abnormality. There is a remote RIGHT occipital infarction with corresponding encephalomalacia, increased since Apr 21, 2020. 2.  No acute fracture or static subluxation of the cervical spine. Electronically Signed  By: Valentino Saxon MD   On: 06/20/2021 19:08   DG Pelvis Portable  Result Date: 06/20/2021 CLINICAL DATA:  Fall.  Pain. EXAM: PORTABLE PELVIS 1-2 VIEWS COMPARISON:   None. FINDINGS: No fracture or bone lesion. Hip joints, SI joints and symphysis pubis are normally aligned. There are arterial vascular calcifications. Soft tissues are otherwise unremarkable. IMPRESSION: 1. No fracture, dislocation or acute finding. Electronically Signed   By: Lajean Manes M.D.   On: 06/20/2021 16:01   DG Chest Port 1 View  Result Date: 06/20/2021 CLINICAL DATA:  Fall. EXAM: PORTABLE CHEST 1 VIEW COMPARISON:  03/14/2021 FINDINGS: Stable changes from prior CABG surgery. Cardiac silhouette is normal in size and configuration. No mediastinal or hilar masses. Clear lungs.  No pleural effusion or pneumothorax. Skeletal structures are grossly intact. IMPRESSION: No active disease. Electronically Signed   By: Lajean Manes M.D.   On: 06/20/2021 16:00   DG Humerus Left  Result Date: 06/20/2021 CLINICAL DATA:  Bruising and pain in upper arm EXAM: LEFT HUMERUS - 2+ VIEW COMPARISON:  None. FINDINGS: No fracture or bone lesion. Shoulder and elbow joints are normally aligned. Soft tissues are unremarkable. IMPRESSION: Negative. Electronically Signed   By: Lajean Manes M.D.   On: 06/20/2021 15:59     Labs:   Basic Metabolic Panel: Recent Labs  Lab 06/23/21 2305 06/24/21 1240 06/24/21 1653 06/27/21 0538 06/28/21 0201  NA 134*  --   --  136 135  K 4.5  --   --  4.5 4.8  CL 108  --   --  106 107  CO2 22  --   --  24 23  GLUCOSE 418* 611* 448* 194* 233*  BUN 26*  --   --  37* 35*  CREATININE 1.70*  --   --  1.71* 1.58*  CALCIUM 9.0  --   --  9.7 9.6   GFR Estimated Creatinine Clearance: 27.4 mL/min (A) (by C-G formula based on SCr of 1.58 mg/dL (H)). Liver Function Tests: No results for input(s): AST, ALT, ALKPHOS, BILITOT, PROT, ALBUMIN in the last 168 hours. No results for input(s): LIPASE, AMYLASE in the last 168 hours. No results for input(s): AMMONIA in the last 168 hours. Coagulation profile No results for input(s): INR, PROTIME in the last 168 hours.  CBC: Recent  Labs  Lab 06/27/21 0538 06/28/21 0201  WBC 7.2 7.5  HGB 7.9* 8.7*  HCT 25.2* 27.3*  MCV 89.7 90.4  PLT 172 200   Cardiac Enzymes: No results for input(s): CKTOTAL, CKMB, CKMBINDEX, TROPONINI in the last 168 hours. BNP: Invalid input(s): POCBNP CBG: Recent Labs  Lab 06/29/21 1104 06/29/21 1611 06/29/21 2115 06/30/21 0626 06/30/21 1133  GLUCAP 175* 324* 125* 84 130*   D-Dimer No results for input(s): DDIMER in the last 72 hours. Hgb A1c No results for input(s): HGBA1C in the last 72 hours. Lipid Profile No results for input(s): CHOL, HDL, LDLCALC, TRIG, CHOLHDL, LDLDIRECT in the last 72 hours. Thyroid function studies No results for input(s): TSH, T4TOTAL, T3FREE, THYROIDAB in the last 72 hours.  Invalid input(s): FREET3 Anemia work up Recent Labs    06/28/21 0201  [id number] 1,442*   Microbiology Recent Results (from the past 240 hour(s))  Resp Panel by RT-PCR (Flu A&B, Covid) Nasopharyngeal Swab     Status: None   Collection Time: 06/20/21  4:29 PM   Specimen: Nasopharyngeal Swab; Nasopharyngeal(NP) swabs in vial transport medium  Result Value Ref Range Status   SARS Coronavirus 2  by RT PCR NEGATIVE NEGATIVE Final    Comment: (NOTE) SARS-CoV-2 target nucleic acids are NOT DETECTED.  The SARS-CoV-2 RNA is generally detectable in upper respiratory specimens during the acute phase of infection. The lowest concentration of SARS-CoV-2 viral copies this assay can detect is 138 copies/mL. A negative result does not preclude SARS-Cov-2 infection and should not be used as the sole basis for treatment or other patient management decisions. A negative result may occur with  improper specimen collection/handling, submission of specimen other than nasopharyngeal swab, presence of viral mutation(s) within the areas targeted by this assay, and inadequate number of viral copies(<138 copies/mL). A negative result must be combined with clinical observations, patient history,  and epidemiological information. The expected result is Negative.  Fact Sheet for Patients:  EntrepreneurPulse.com.au  Fact Sheet for Healthcare Providers:  IncredibleEmployment.be  This test is no t yet approved or cleared by the [place] FDA and  has been authorized for detection and/or diagnosis of SARS-CoV-2 by FDA under an Emergency Use Authorization (EUA). This EUA will remain  in effect (meaning this test can be used) for the duration of the COVID-19 declaration under Section 564(b)(1) of the Act, 21 U.S.C.section 360bbb-3(b)(1), unless the authorization is terminated  or revoked sooner.       Influenza A by PCR NEGATIVE NEGATIVE Final   Influenza B by PCR NEGATIVE NEGATIVE Final    Comment: (NOTE) The Xpert Xpress SARS-CoV-2/FLU/RSV plus assay is intended as an aid in the diagnosis of influenza from Nasopharyngeal swab specimens and should not be used as a sole basis for treatment. Nasal washings and aspirates are unacceptable for Xpert Xpress SARS-CoV-2/FLU/RSV testing.  Fact Sheet for Patients: EntrepreneurPulse.com.au  Fact Sheet for Healthcare Providers: IncredibleEmployment.be  This test is not yet approved or cleared by the [place] FDA and has been authorized for detection and/or diagnosis of SARS-CoV-2 by FDA under an Emergency Use Authorization (EUA). This EUA will remain in effect (meaning this test can be used) for the duration of the COVID-19 declaration under Section 564(b)(1) of the Act, 21 U.S.C. section 360bbb-3(b)(1), unless the authorization is terminated or revoked.  Performed at [facility], [place] [address], [place], [place] [address]   Urine Culture     Status: Abnormal   Collection Time: 06/20/21  7:04 PM   Specimen: Urine, Clean Catch  Result Value Ref Range Status   Specimen Description URINE, CLEAN CATCH  Final   Special Requests   Final     NONE Performed at [facility], [place] [address] [place], [place] [address]    Culture MULTIPLE SPECIES PRESENT, SUGGEST RECOLLECTION (A)  Final   Report Status 06/22/2021 FINAL  Final  Urine Culture     Status: Abnormal   Collection Time: 06/22/21  2:21 PM   Specimen: Urine, Clean Catch  Result Value Ref Range Status   Specimen Description URINE, CLEAN CATCH  Final   Special Requests NONE  Final   Culture (A)  Final    <10,000 COLONIES/mL INSIGNIFICANT GROWTH Performed at [facility], [place] [address] [place], [place] [address]    Report Status 06/24/2021 FINAL  Final  SARS CORONAVIRUS 2 (TAT 6-24 HRS) Nasopharyngeal Nasopharyngeal Swab     Status: None   Collection Time: 06/29/21  5:07 PM   Specimen: Nasopharyngeal Swab  Result Value Ref Range Status   SARS Coronavirus 2 NEGATIVE NEGATIVE Final    Comment: (NOTE) SARS-CoV-2 target nucleic acids are NOT DETECTED.  The  SARS-CoV-2 RNA is generally detectable in upper and lower respiratory specimens during the acute phase of infection. Negative results do not preclude SARS-CoV-2 infection, do not rule out co-infections with other pathogens, and should not be used as the sole basis for treatment or other patient management decisions. Negative results must be combined with clinical observations, patient history, and epidemiological information. The expected result is Negative.  Fact Sheet for Patients: SugarRoll.be  Fact Sheet for Healthcare Providers: https://www.woods-mathews.com/  This test is not yet approved or cleared by the [place] FDA and  has been authorized for detection and/or diagnosis of SARS-CoV-2 by FDA under an Emergency Use Authorization (EUA). This EUA will remain  in effect (meaning this test can be used) for the duration of the COVID-19 declaration under Se ction 564(b)(1) of the Act, 21 U.S.C. section 360bbb-3(b)(1), unless the authorization is  terminated or revoked sooner.  Performed at [facility], [place] [address], [place], [place] [address]      Discharge Instructions:   Discharge Instructions     Diet Carb Modified   Complete by: As directed    Increase activity slowly   Complete by: As directed       Allergies as of 06/30/2021       Reactions   Atorvastatin    Other reaction(s): Other (See Comments) Muscle weakness and pain in legs        Medication List     STOP taking these medications    ALPRAZolam 0.5 MG tablet Commonly known as: XANAX   Basaglar KwikPen 100 UNIT/ML   nitrofurantoin (macrocrystal-monohydrate) 100 MG capsule Commonly known as: MACROBID   nitroGLYCERIN 0.4 MG SL tablet Commonly known as: NITROSTAT   NovoLOG FlexPen 100 UNIT/ML FlexPen Generic drug: insulin aspart Replaced by: insulin aspart 100 UNIT/ML injection   tamsulosin 0.4 MG Caps capsule Commonly known as: FLOMAX   traMADol 50 MG tablet Commonly known as: ULTRAM   Trulicity 1.5 IF/0.2DX Sopn Generic drug: Dulaglutide       TAKE these medications    Brilinta 90 MG Tabs tablet Generic drug: ticagrelor Take 1 tablet (90 mg total) by mouth 2 (two) times daily.   budesonide-formoterol 160-4.5 MCG/ACT inhaler Commonly known as: SYMBICORT Inhale 2 puffs into the lungs 2 (two) times daily.   diclofenac Sodium 1 % Gel Commonly known as: VOLTAREN Apply 2 g topically 4 (four) times daily.   ezetimibe 10 MG tablet Commonly known as: ZETIA Take 10 mg by mouth daily.   feeding supplement (GLUCERNA SHAKE) Liqd Take 237 mLs by mouth 3 (three) times daily between meals.   ferrous sulfate 325 (65 FE) MG tablet Take 325 mg by mouth daily with breakfast.   furosemide 40 MG tablet Commonly known as: LASIX Take 20 mg by mouth daily as needed for fluid.   insulin aspart 100 UNIT/ML injection Commonly known as: novoLOG Inject 0-20 Units into the skin 3 (three) times daily with meals. Replaces: NovoLOG  FlexPen 100 UNIT/ML FlexPen   insulin aspart 100 UNIT/ML injection Commonly known as: novoLOG Inject 0-5 Units into the skin at bedtime.   insulin aspart 100 UNIT/ML injection Commonly known as: novoLOG Inject 20 Units into the skin 3 (three) times daily with meals.   insulin glargine-yfgn 100 UNIT/ML injection Commonly known as: SEMGLEE Inject 0.5 mLs (50 Units total) into the skin daily. Start taking on: July 01, 2021   ipratropium-albuterol 0.5-2.5 (3) MG/3ML Soln Commonly known as: DUONEB Take 3 mLs by nebulization 4 (four) times  daily as needed (shortness of breath/wheezing).   isosorbide mononitrate 120 MG 24 hr tablet Commonly known as: IMDUR TAKE 1 TABLET(120 MG) BY MOUTH DAILY What changed: See the new instructions.   ketoconazole 2 % cream Commonly known as: NIZORAL Apply 1 application topically daily as needed for irritation.   levothyroxine 50 MCG tablet Commonly known as: SYNTHROID Take 50 mcg by mouth daily before breakfast.   linagliptin 5 MG Tabs tablet Commonly known as: TRADJENTA Take 1 tablet (5 mg total) by mouth daily. Start taking on: July 01, 2021   Melatonin 10 MG Tabs Take 10 mg by mouth at bedtime.   metoprolol succinate 25 MG 24 hr tablet Commonly known as: TOPROL-XL Take 3 tablets (75 mg total) by mouth daily. Take with or immediately following a meal. Start taking on: July 01, 2021 What changed:  medication strength how much to take   multivitamin Tabs tablet Take 1 tablet by mouth at bedtime.   nicotine 21 mg/24hr patch Commonly known as: NICODERM CQ - dosed in mg/24 hours Place 1 patch (21 mg total) onto the skin daily. Start taking on: July 01, 2021   pantoprazole 40 MG tablet Commonly known as: PROTONIX Take 40 mg by mouth daily.   polyethylene glycol powder 17 GM/SCOOP powder Commonly known as: GLYCOLAX/MIRALAX Take 17 g by mouth daily as needed for constipation.   Praluent 150 MG/ML Soaj Generic drug:  Alirocumab Inject 1 Dose into the skin every 14 (fourteen) days.   QUEtiapine 50 MG tablet Commonly known as: SEROQUEL Take 50 mg by mouth 2 (two) times daily.   rosuvastatin 20 MG tablet Commonly known as: CRESTOR Take 1 tablet (20 mg total) by mouth daily.   VITAMIN B 12 PO Take 1,000 mcg by mouth daily.   VITAMIN C PO Take 2,000 mg by mouth daily.   Vitamin D (Ergocalciferol) 1.25 MG (50000 UNIT) Caps capsule Commonly known as: DRISDOL Take 50,000 Units by mouth once a week.        Follow-up Information     Tonya Coop, FNP Follow up in 1 week(s).   Specialty: Family Medicine Contact information: [place] [address] [phone]         Park Liter, MD .   Specialty: Cardiology Contact information: [address] [place] [address] [phone]                  Time coordinating discharge: 35 min  Signed:  Geradine Girt DO  [facility] 06/30/2021, 12:58 PM

## 2021-06-30 NOTE — Progress Notes (Signed)
Pt being d/c, VSS, IV removed, telebox returned, education complete and handed to [place]. Chrisandra Carota, RN 06/30/2021 5:30 PM

## 2021-06-30 NOTE — TOC Transition Note (Signed)
Transition of Care [facility]) - CM/SW Discharge Note   Patient Details  Name: ESLI CLEMENTS MRN: [id number] Date of Birth: May 07, 1946  [facility] CM/SW Contact:  Vinie Sill, LCSW Phone Number: 06/30/2021, 2:31 PM   Clinical Narrative:     Patient will Discharge to: [facility] Discharge Date: 06/30/2021 Family Notified: Sheri,daughter Transport By: Corey Harold  Per MD patient is ready for discharge. RN, patient, and facility notified of discharge. Discharge Summary and covid test results sent to facility. RN given number for [phone]. Ambulance transport requested for patient.   Clinical Social Worker signing off.  Thurmond Butts, MSW, LCSW Clinical Social Worker    Final next level of care: Skilled Nursing Facility Barriers to Discharge: Barriers Resolved   Patient Goals and CMS Choice        Discharge Placement              Patient chooses bed at: [facility] Patient to be transferred to facility by: [place] Name of family member notified: daughter Patient and family notified of of transfer: 06/30/21  Discharge Plan and Services In-house Referral: Clinical Social Work                                   Social Determinants of Health (SDOH) Interventions     Readmission Risk Interventions No flowsheet data found.

## 2021-07-01 DIAGNOSIS — N184 Chronic kidney disease, stage 4 (severe): Secondary | ICD-10-CM | POA: Diagnosis not present

## 2021-07-01 DIAGNOSIS — E119 Type 2 diabetes mellitus without complications: Secondary | ICD-10-CM | POA: Diagnosis not present

## 2021-07-01 DIAGNOSIS — I10 Essential (primary) hypertension: Secondary | ICD-10-CM | POA: Diagnosis not present

## 2021-07-03 DIAGNOSIS — R739 Hyperglycemia, unspecified: Secondary | ICD-10-CM | POA: Diagnosis not present

## 2021-07-09 DIAGNOSIS — E785 Hyperlipidemia, unspecified: Secondary | ICD-10-CM | POA: Diagnosis not present

## 2021-07-09 DIAGNOSIS — I1310 Hypertensive heart and chronic kidney disease without heart failure, with stage 1 through stage 4 chronic kidney disease, or unspecified chronic kidney disease: Secondary | ICD-10-CM | POA: Diagnosis not present

## 2021-07-09 DIAGNOSIS — D631 Anemia in chronic kidney disease: Secondary | ICD-10-CM | POA: Diagnosis not present

## 2021-07-09 DIAGNOSIS — K219 Gastro-esophageal reflux disease without esophagitis: Secondary | ICD-10-CM | POA: Diagnosis not present

## 2021-07-09 DIAGNOSIS — R69 Illness, unspecified: Secondary | ICD-10-CM | POA: Diagnosis not present

## 2021-07-09 DIAGNOSIS — N184 Chronic kidney disease, stage 4 (severe): Secondary | ICD-10-CM | POA: Diagnosis not present

## 2021-07-09 DIAGNOSIS — E1122 Type 2 diabetes mellitus with diabetic chronic kidney disease: Secondary | ICD-10-CM | POA: Diagnosis not present

## 2021-07-10 DIAGNOSIS — E1165 Type 2 diabetes mellitus with hyperglycemia: Secondary | ICD-10-CM | POA: Diagnosis not present

## 2021-07-10 DIAGNOSIS — K219 Gastro-esophageal reflux disease without esophagitis: Secondary | ICD-10-CM | POA: Diagnosis not present

## 2021-07-10 DIAGNOSIS — I2510 Atherosclerotic heart disease of native coronary artery without angina pectoris: Secondary | ICD-10-CM | POA: Diagnosis not present

## 2021-07-10 DIAGNOSIS — I25708 Atherosclerosis of coronary artery bypass graft(s), unspecified, with other forms of angina pectoris: Secondary | ICD-10-CM | POA: Diagnosis not present

## 2021-07-16 DIAGNOSIS — R69 Illness, unspecified: Secondary | ICD-10-CM | POA: Diagnosis not present

## 2021-07-16 DIAGNOSIS — E1122 Type 2 diabetes mellitus with diabetic chronic kidney disease: Secondary | ICD-10-CM | POA: Diagnosis not present

## 2021-07-16 DIAGNOSIS — D631 Anemia in chronic kidney disease: Secondary | ICD-10-CM | POA: Diagnosis not present

## 2021-07-16 DIAGNOSIS — E039 Hypothyroidism, unspecified: Secondary | ICD-10-CM | POA: Diagnosis not present

## 2021-07-16 DIAGNOSIS — I1310 Hypertensive heart and chronic kidney disease without heart failure, with stage 1 through stage 4 chronic kidney disease, or unspecified chronic kidney disease: Secondary | ICD-10-CM | POA: Diagnosis not present

## 2021-07-16 DIAGNOSIS — N184 Chronic kidney disease, stage 4 (severe): Secondary | ICD-10-CM | POA: Diagnosis not present

## 2021-07-20 DIAGNOSIS — I1310 Hypertensive heart and chronic kidney disease without heart failure, with stage 1 through stage 4 chronic kidney disease, or unspecified chronic kidney disease: Secondary | ICD-10-CM | POA: Diagnosis not present

## 2021-07-20 DIAGNOSIS — E1165 Type 2 diabetes mellitus with hyperglycemia: Secondary | ICD-10-CM | POA: Diagnosis not present

## 2021-07-20 DIAGNOSIS — I2510 Atherosclerotic heart disease of native coronary artery without angina pectoris: Secondary | ICD-10-CM | POA: Diagnosis not present

## 2021-07-20 DIAGNOSIS — R69 Illness, unspecified: Secondary | ICD-10-CM | POA: Diagnosis not present

## 2021-07-20 DIAGNOSIS — E039 Hypothyroidism, unspecified: Secondary | ICD-10-CM | POA: Diagnosis not present

## 2021-07-20 DIAGNOSIS — R296 Repeated falls: Secondary | ICD-10-CM | POA: Diagnosis not present

## 2021-07-20 DIAGNOSIS — N184 Chronic kidney disease, stage 4 (severe): Secondary | ICD-10-CM | POA: Diagnosis not present

## 2021-07-20 DIAGNOSIS — E1122 Type 2 diabetes mellitus with diabetic chronic kidney disease: Secondary | ICD-10-CM | POA: Diagnosis not present

## 2021-07-20 DIAGNOSIS — J449 Chronic obstructive pulmonary disease, unspecified: Secondary | ICD-10-CM | POA: Diagnosis not present

## 2021-07-20 DIAGNOSIS — D631 Anemia in chronic kidney disease: Secondary | ICD-10-CM | POA: Diagnosis not present

## 2021-07-21 DIAGNOSIS — N184 Chronic kidney disease, stage 4 (severe): Secondary | ICD-10-CM | POA: Diagnosis not present

## 2021-07-21 DIAGNOSIS — J449 Chronic obstructive pulmonary disease, unspecified: Secondary | ICD-10-CM | POA: Diagnosis not present

## 2021-07-21 DIAGNOSIS — R296 Repeated falls: Secondary | ICD-10-CM | POA: Diagnosis not present

## 2021-07-21 DIAGNOSIS — I1310 Hypertensive heart and chronic kidney disease without heart failure, with stage 1 through stage 4 chronic kidney disease, or unspecified chronic kidney disease: Secondary | ICD-10-CM | POA: Diagnosis not present

## 2021-07-21 DIAGNOSIS — E1122 Type 2 diabetes mellitus with diabetic chronic kidney disease: Secondary | ICD-10-CM | POA: Diagnosis not present

## 2021-07-21 DIAGNOSIS — R69 Illness, unspecified: Secondary | ICD-10-CM | POA: Diagnosis not present

## 2021-07-21 DIAGNOSIS — E1165 Type 2 diabetes mellitus with hyperglycemia: Secondary | ICD-10-CM | POA: Diagnosis not present

## 2021-07-21 DIAGNOSIS — E039 Hypothyroidism, unspecified: Secondary | ICD-10-CM | POA: Diagnosis not present

## 2021-07-21 DIAGNOSIS — I2510 Atherosclerotic heart disease of native coronary artery without angina pectoris: Secondary | ICD-10-CM | POA: Diagnosis not present

## 2021-07-21 DIAGNOSIS — D631 Anemia in chronic kidney disease: Secondary | ICD-10-CM | POA: Diagnosis not present

## 2021-07-22 DIAGNOSIS — Z09 Encounter for follow-up examination after completed treatment for conditions other than malignant neoplasm: Secondary | ICD-10-CM | POA: Diagnosis not present

## 2021-07-22 DIAGNOSIS — E118 Type 2 diabetes mellitus with unspecified complications: Secondary | ICD-10-CM | POA: Diagnosis not present

## 2021-07-22 DIAGNOSIS — Z79899 Other long term (current) drug therapy: Secondary | ICD-10-CM | POA: Diagnosis not present

## 2021-07-22 DIAGNOSIS — I10 Essential (primary) hypertension: Secondary | ICD-10-CM | POA: Diagnosis not present

## 2021-07-22 DIAGNOSIS — R69 Illness, unspecified: Secondary | ICD-10-CM | POA: Diagnosis not present

## 2021-07-22 DIAGNOSIS — E538 Deficiency of other specified B group vitamins: Secondary | ICD-10-CM | POA: Diagnosis not present

## 2021-07-24 DIAGNOSIS — E1122 Type 2 diabetes mellitus with diabetic chronic kidney disease: Secondary | ICD-10-CM | POA: Diagnosis not present

## 2021-07-24 DIAGNOSIS — R296 Repeated falls: Secondary | ICD-10-CM | POA: Diagnosis not present

## 2021-07-24 DIAGNOSIS — D631 Anemia in chronic kidney disease: Secondary | ICD-10-CM | POA: Diagnosis not present

## 2021-07-24 DIAGNOSIS — J449 Chronic obstructive pulmonary disease, unspecified: Secondary | ICD-10-CM | POA: Diagnosis not present

## 2021-07-24 DIAGNOSIS — N184 Chronic kidney disease, stage 4 (severe): Secondary | ICD-10-CM | POA: Diagnosis not present

## 2021-07-24 DIAGNOSIS — I2510 Atherosclerotic heart disease of native coronary artery without angina pectoris: Secondary | ICD-10-CM | POA: Diagnosis not present

## 2021-07-24 DIAGNOSIS — E1165 Type 2 diabetes mellitus with hyperglycemia: Secondary | ICD-10-CM | POA: Diagnosis not present

## 2021-07-24 DIAGNOSIS — I1310 Hypertensive heart and chronic kidney disease without heart failure, with stage 1 through stage 4 chronic kidney disease, or unspecified chronic kidney disease: Secondary | ICD-10-CM | POA: Diagnosis not present

## 2021-07-24 DIAGNOSIS — R69 Illness, unspecified: Secondary | ICD-10-CM | POA: Diagnosis not present

## 2021-07-24 DIAGNOSIS — E039 Hypothyroidism, unspecified: Secondary | ICD-10-CM | POA: Diagnosis not present

## 2021-07-27 DIAGNOSIS — R296 Repeated falls: Secondary | ICD-10-CM | POA: Diagnosis not present

## 2021-07-27 DIAGNOSIS — N184 Chronic kidney disease, stage 4 (severe): Secondary | ICD-10-CM | POA: Diagnosis not present

## 2021-07-27 DIAGNOSIS — I1310 Hypertensive heart and chronic kidney disease without heart failure, with stage 1 through stage 4 chronic kidney disease, or unspecified chronic kidney disease: Secondary | ICD-10-CM | POA: Diagnosis not present

## 2021-07-27 DIAGNOSIS — J449 Chronic obstructive pulmonary disease, unspecified: Secondary | ICD-10-CM | POA: Diagnosis not present

## 2021-07-27 DIAGNOSIS — E1165 Type 2 diabetes mellitus with hyperglycemia: Secondary | ICD-10-CM | POA: Diagnosis not present

## 2021-07-27 DIAGNOSIS — I2510 Atherosclerotic heart disease of native coronary artery without angina pectoris: Secondary | ICD-10-CM | POA: Diagnosis not present

## 2021-07-27 DIAGNOSIS — R69 Illness, unspecified: Secondary | ICD-10-CM | POA: Diagnosis not present

## 2021-07-27 DIAGNOSIS — E1122 Type 2 diabetes mellitus with diabetic chronic kidney disease: Secondary | ICD-10-CM | POA: Diagnosis not present

## 2021-07-27 DIAGNOSIS — E039 Hypothyroidism, unspecified: Secondary | ICD-10-CM | POA: Diagnosis not present

## 2021-07-27 DIAGNOSIS — D631 Anemia in chronic kidney disease: Secondary | ICD-10-CM | POA: Diagnosis not present

## 2021-07-28 DIAGNOSIS — D631 Anemia in chronic kidney disease: Secondary | ICD-10-CM | POA: Diagnosis not present

## 2021-07-28 DIAGNOSIS — J449 Chronic obstructive pulmonary disease, unspecified: Secondary | ICD-10-CM | POA: Diagnosis not present

## 2021-07-28 DIAGNOSIS — I1310 Hypertensive heart and chronic kidney disease without heart failure, with stage 1 through stage 4 chronic kidney disease, or unspecified chronic kidney disease: Secondary | ICD-10-CM | POA: Diagnosis not present

## 2021-07-28 DIAGNOSIS — E875 Hyperkalemia: Secondary | ICD-10-CM | POA: Diagnosis not present

## 2021-07-28 DIAGNOSIS — R69 Illness, unspecified: Secondary | ICD-10-CM | POA: Diagnosis not present

## 2021-07-28 DIAGNOSIS — E1165 Type 2 diabetes mellitus with hyperglycemia: Secondary | ICD-10-CM | POA: Diagnosis not present

## 2021-07-28 DIAGNOSIS — E039 Hypothyroidism, unspecified: Secondary | ICD-10-CM | POA: Diagnosis not present

## 2021-07-28 DIAGNOSIS — N184 Chronic kidney disease, stage 4 (severe): Secondary | ICD-10-CM | POA: Diagnosis not present

## 2021-07-28 DIAGNOSIS — I2510 Atherosclerotic heart disease of native coronary artery without angina pectoris: Secondary | ICD-10-CM | POA: Diagnosis not present

## 2021-07-28 DIAGNOSIS — E1122 Type 2 diabetes mellitus with diabetic chronic kidney disease: Secondary | ICD-10-CM | POA: Diagnosis not present

## 2021-07-28 DIAGNOSIS — R296 Repeated falls: Secondary | ICD-10-CM | POA: Diagnosis not present

## 2021-07-29 DIAGNOSIS — N184 Chronic kidney disease, stage 4 (severe): Secondary | ICD-10-CM | POA: Diagnosis not present

## 2021-07-29 DIAGNOSIS — E039 Hypothyroidism, unspecified: Secondary | ICD-10-CM | POA: Diagnosis not present

## 2021-07-29 DIAGNOSIS — E1165 Type 2 diabetes mellitus with hyperglycemia: Secondary | ICD-10-CM | POA: Diagnosis not present

## 2021-07-29 DIAGNOSIS — J449 Chronic obstructive pulmonary disease, unspecified: Secondary | ICD-10-CM | POA: Diagnosis not present

## 2021-07-29 DIAGNOSIS — D631 Anemia in chronic kidney disease: Secondary | ICD-10-CM | POA: Diagnosis not present

## 2021-07-29 DIAGNOSIS — R296 Repeated falls: Secondary | ICD-10-CM | POA: Diagnosis not present

## 2021-07-29 DIAGNOSIS — R69 Illness, unspecified: Secondary | ICD-10-CM | POA: Diagnosis not present

## 2021-07-29 DIAGNOSIS — I2510 Atherosclerotic heart disease of native coronary artery without angina pectoris: Secondary | ICD-10-CM | POA: Diagnosis not present

## 2021-07-29 DIAGNOSIS — I1310 Hypertensive heart and chronic kidney disease without heart failure, with stage 1 through stage 4 chronic kidney disease, or unspecified chronic kidney disease: Secondary | ICD-10-CM | POA: Diagnosis not present

## 2021-07-29 DIAGNOSIS — E1122 Type 2 diabetes mellitus with diabetic chronic kidney disease: Secondary | ICD-10-CM | POA: Diagnosis not present

## 2021-07-31 DIAGNOSIS — N184 Chronic kidney disease, stage 4 (severe): Secondary | ICD-10-CM | POA: Diagnosis not present

## 2021-07-31 DIAGNOSIS — D631 Anemia in chronic kidney disease: Secondary | ICD-10-CM | POA: Diagnosis not present

## 2021-07-31 DIAGNOSIS — E1122 Type 2 diabetes mellitus with diabetic chronic kidney disease: Secondary | ICD-10-CM | POA: Diagnosis not present

## 2021-07-31 DIAGNOSIS — I2510 Atherosclerotic heart disease of native coronary artery without angina pectoris: Secondary | ICD-10-CM | POA: Diagnosis not present

## 2021-07-31 DIAGNOSIS — R296 Repeated falls: Secondary | ICD-10-CM | POA: Diagnosis not present

## 2021-07-31 DIAGNOSIS — E039 Hypothyroidism, unspecified: Secondary | ICD-10-CM | POA: Diagnosis not present

## 2021-07-31 DIAGNOSIS — J449 Chronic obstructive pulmonary disease, unspecified: Secondary | ICD-10-CM | POA: Diagnosis not present

## 2021-07-31 DIAGNOSIS — E1165 Type 2 diabetes mellitus with hyperglycemia: Secondary | ICD-10-CM | POA: Diagnosis not present

## 2021-07-31 DIAGNOSIS — I1310 Hypertensive heart and chronic kidney disease without heart failure, with stage 1 through stage 4 chronic kidney disease, or unspecified chronic kidney disease: Secondary | ICD-10-CM | POA: Diagnosis not present

## 2021-07-31 DIAGNOSIS — R69 Illness, unspecified: Secondary | ICD-10-CM | POA: Diagnosis not present

## 2021-08-03 DIAGNOSIS — E1165 Type 2 diabetes mellitus with hyperglycemia: Secondary | ICD-10-CM | POA: Diagnosis not present

## 2021-08-03 DIAGNOSIS — R69 Illness, unspecified: Secondary | ICD-10-CM | POA: Diagnosis not present

## 2021-08-03 DIAGNOSIS — I2510 Atherosclerotic heart disease of native coronary artery without angina pectoris: Secondary | ICD-10-CM | POA: Diagnosis not present

## 2021-08-03 DIAGNOSIS — E1122 Type 2 diabetes mellitus with diabetic chronic kidney disease: Secondary | ICD-10-CM | POA: Diagnosis not present

## 2021-08-03 DIAGNOSIS — J449 Chronic obstructive pulmonary disease, unspecified: Secondary | ICD-10-CM | POA: Diagnosis not present

## 2021-08-03 DIAGNOSIS — R296 Repeated falls: Secondary | ICD-10-CM | POA: Diagnosis not present

## 2021-08-03 DIAGNOSIS — D631 Anemia in chronic kidney disease: Secondary | ICD-10-CM | POA: Diagnosis not present

## 2021-08-03 DIAGNOSIS — I1310 Hypertensive heart and chronic kidney disease without heart failure, with stage 1 through stage 4 chronic kidney disease, or unspecified chronic kidney disease: Secondary | ICD-10-CM | POA: Diagnosis not present

## 2021-08-03 DIAGNOSIS — N184 Chronic kidney disease, stage 4 (severe): Secondary | ICD-10-CM | POA: Diagnosis not present

## 2021-08-03 DIAGNOSIS — E039 Hypothyroidism, unspecified: Secondary | ICD-10-CM | POA: Diagnosis not present

## 2021-08-04 DIAGNOSIS — I2510 Atherosclerotic heart disease of native coronary artery without angina pectoris: Secondary | ICD-10-CM | POA: Diagnosis not present

## 2021-08-04 DIAGNOSIS — E1165 Type 2 diabetes mellitus with hyperglycemia: Secondary | ICD-10-CM | POA: Diagnosis not present

## 2021-08-04 DIAGNOSIS — I1310 Hypertensive heart and chronic kidney disease without heart failure, with stage 1 through stage 4 chronic kidney disease, or unspecified chronic kidney disease: Secondary | ICD-10-CM | POA: Diagnosis not present

## 2021-08-04 DIAGNOSIS — J449 Chronic obstructive pulmonary disease, unspecified: Secondary | ICD-10-CM | POA: Diagnosis not present

## 2021-08-04 DIAGNOSIS — R296 Repeated falls: Secondary | ICD-10-CM | POA: Diagnosis not present

## 2021-08-04 DIAGNOSIS — E039 Hypothyroidism, unspecified: Secondary | ICD-10-CM | POA: Diagnosis not present

## 2021-08-04 DIAGNOSIS — R69 Illness, unspecified: Secondary | ICD-10-CM | POA: Diagnosis not present

## 2021-08-04 DIAGNOSIS — E1122 Type 2 diabetes mellitus with diabetic chronic kidney disease: Secondary | ICD-10-CM | POA: Diagnosis not present

## 2021-08-04 DIAGNOSIS — N184 Chronic kidney disease, stage 4 (severe): Secondary | ICD-10-CM | POA: Diagnosis not present

## 2021-08-04 DIAGNOSIS — D631 Anemia in chronic kidney disease: Secondary | ICD-10-CM | POA: Diagnosis not present

## 2021-08-07 DIAGNOSIS — D631 Anemia in chronic kidney disease: Secondary | ICD-10-CM | POA: Diagnosis not present

## 2021-08-07 DIAGNOSIS — I1310 Hypertensive heart and chronic kidney disease without heart failure, with stage 1 through stage 4 chronic kidney disease, or unspecified chronic kidney disease: Secondary | ICD-10-CM | POA: Diagnosis not present

## 2021-08-07 DIAGNOSIS — E1165 Type 2 diabetes mellitus with hyperglycemia: Secondary | ICD-10-CM | POA: Diagnosis not present

## 2021-08-07 DIAGNOSIS — N184 Chronic kidney disease, stage 4 (severe): Secondary | ICD-10-CM | POA: Diagnosis not present

## 2021-08-07 DIAGNOSIS — E1122 Type 2 diabetes mellitus with diabetic chronic kidney disease: Secondary | ICD-10-CM | POA: Diagnosis not present

## 2021-08-07 DIAGNOSIS — E039 Hypothyroidism, unspecified: Secondary | ICD-10-CM | POA: Diagnosis not present

## 2021-08-07 DIAGNOSIS — I2510 Atherosclerotic heart disease of native coronary artery without angina pectoris: Secondary | ICD-10-CM | POA: Diagnosis not present

## 2021-08-07 DIAGNOSIS — J449 Chronic obstructive pulmonary disease, unspecified: Secondary | ICD-10-CM | POA: Diagnosis not present

## 2021-08-07 DIAGNOSIS — R296 Repeated falls: Secondary | ICD-10-CM | POA: Diagnosis not present

## 2021-08-07 DIAGNOSIS — R69 Illness, unspecified: Secondary | ICD-10-CM | POA: Diagnosis not present

## 2021-08-10 DIAGNOSIS — I1310 Hypertensive heart and chronic kidney disease without heart failure, with stage 1 through stage 4 chronic kidney disease, or unspecified chronic kidney disease: Secondary | ICD-10-CM | POA: Diagnosis not present

## 2021-08-10 DIAGNOSIS — I2510 Atherosclerotic heart disease of native coronary artery without angina pectoris: Secondary | ICD-10-CM | POA: Diagnosis not present

## 2021-08-10 DIAGNOSIS — E039 Hypothyroidism, unspecified: Secondary | ICD-10-CM | POA: Diagnosis not present

## 2021-08-10 DIAGNOSIS — E1122 Type 2 diabetes mellitus with diabetic chronic kidney disease: Secondary | ICD-10-CM | POA: Diagnosis not present

## 2021-08-10 DIAGNOSIS — E1165 Type 2 diabetes mellitus with hyperglycemia: Secondary | ICD-10-CM | POA: Diagnosis not present

## 2021-08-10 DIAGNOSIS — N184 Chronic kidney disease, stage 4 (severe): Secondary | ICD-10-CM | POA: Diagnosis not present

## 2021-08-10 DIAGNOSIS — D631 Anemia in chronic kidney disease: Secondary | ICD-10-CM | POA: Diagnosis not present

## 2021-08-10 DIAGNOSIS — R296 Repeated falls: Secondary | ICD-10-CM | POA: Diagnosis not present

## 2021-08-10 DIAGNOSIS — J449 Chronic obstructive pulmonary disease, unspecified: Secondary | ICD-10-CM | POA: Diagnosis not present

## 2021-08-10 DIAGNOSIS — R69 Illness, unspecified: Secondary | ICD-10-CM | POA: Diagnosis not present

## 2021-08-11 DIAGNOSIS — N184 Chronic kidney disease, stage 4 (severe): Secondary | ICD-10-CM | POA: Diagnosis not present

## 2021-08-11 DIAGNOSIS — J449 Chronic obstructive pulmonary disease, unspecified: Secondary | ICD-10-CM | POA: Diagnosis not present

## 2021-08-11 DIAGNOSIS — I1310 Hypertensive heart and chronic kidney disease without heart failure, with stage 1 through stage 4 chronic kidney disease, or unspecified chronic kidney disease: Secondary | ICD-10-CM | POA: Diagnosis not present

## 2021-08-11 DIAGNOSIS — E1165 Type 2 diabetes mellitus with hyperglycemia: Secondary | ICD-10-CM | POA: Diagnosis not present

## 2021-08-11 DIAGNOSIS — R296 Repeated falls: Secondary | ICD-10-CM | POA: Diagnosis not present

## 2021-08-11 DIAGNOSIS — D631 Anemia in chronic kidney disease: Secondary | ICD-10-CM | POA: Diagnosis not present

## 2021-08-11 DIAGNOSIS — R69 Illness, unspecified: Secondary | ICD-10-CM | POA: Diagnosis not present

## 2021-08-11 DIAGNOSIS — E039 Hypothyroidism, unspecified: Secondary | ICD-10-CM | POA: Diagnosis not present

## 2021-08-11 DIAGNOSIS — E1122 Type 2 diabetes mellitus with diabetic chronic kidney disease: Secondary | ICD-10-CM | POA: Diagnosis not present

## 2021-08-11 DIAGNOSIS — I2510 Atherosclerotic heart disease of native coronary artery without angina pectoris: Secondary | ICD-10-CM | POA: Diagnosis not present

## 2021-08-13 DIAGNOSIS — J449 Chronic obstructive pulmonary disease, unspecified: Secondary | ICD-10-CM | POA: Diagnosis not present

## 2021-08-13 DIAGNOSIS — E1165 Type 2 diabetes mellitus with hyperglycemia: Secondary | ICD-10-CM | POA: Diagnosis not present

## 2021-08-13 DIAGNOSIS — E1122 Type 2 diabetes mellitus with diabetic chronic kidney disease: Secondary | ICD-10-CM | POA: Diagnosis not present

## 2021-08-13 DIAGNOSIS — D631 Anemia in chronic kidney disease: Secondary | ICD-10-CM | POA: Diagnosis not present

## 2021-08-13 DIAGNOSIS — R69 Illness, unspecified: Secondary | ICD-10-CM | POA: Diagnosis not present

## 2021-08-13 DIAGNOSIS — E039 Hypothyroidism, unspecified: Secondary | ICD-10-CM | POA: Diagnosis not present

## 2021-08-13 DIAGNOSIS — I2510 Atherosclerotic heart disease of native coronary artery without angina pectoris: Secondary | ICD-10-CM | POA: Diagnosis not present

## 2021-08-13 DIAGNOSIS — N184 Chronic kidney disease, stage 4 (severe): Secondary | ICD-10-CM | POA: Diagnosis not present

## 2021-08-13 DIAGNOSIS — R296 Repeated falls: Secondary | ICD-10-CM | POA: Diagnosis not present

## 2021-08-13 DIAGNOSIS — I1310 Hypertensive heart and chronic kidney disease without heart failure, with stage 1 through stage 4 chronic kidney disease, or unspecified chronic kidney disease: Secondary | ICD-10-CM | POA: Diagnosis not present

## 2021-08-17 DIAGNOSIS — N184 Chronic kidney disease, stage 4 (severe): Secondary | ICD-10-CM | POA: Diagnosis not present

## 2021-08-17 DIAGNOSIS — E1165 Type 2 diabetes mellitus with hyperglycemia: Secondary | ICD-10-CM | POA: Diagnosis not present

## 2021-08-17 DIAGNOSIS — D631 Anemia in chronic kidney disease: Secondary | ICD-10-CM | POA: Diagnosis not present

## 2021-08-17 DIAGNOSIS — J449 Chronic obstructive pulmonary disease, unspecified: Secondary | ICD-10-CM | POA: Diagnosis not present

## 2021-08-17 DIAGNOSIS — R296 Repeated falls: Secondary | ICD-10-CM | POA: Diagnosis not present

## 2021-08-17 DIAGNOSIS — E039 Hypothyroidism, unspecified: Secondary | ICD-10-CM | POA: Diagnosis not present

## 2021-08-17 DIAGNOSIS — R69 Illness, unspecified: Secondary | ICD-10-CM | POA: Diagnosis not present

## 2021-08-17 DIAGNOSIS — I1310 Hypertensive heart and chronic kidney disease without heart failure, with stage 1 through stage 4 chronic kidney disease, or unspecified chronic kidney disease: Secondary | ICD-10-CM | POA: Diagnosis not present

## 2021-08-17 DIAGNOSIS — I2510 Atherosclerotic heart disease of native coronary artery without angina pectoris: Secondary | ICD-10-CM | POA: Diagnosis not present

## 2021-08-17 DIAGNOSIS — E1122 Type 2 diabetes mellitus with diabetic chronic kidney disease: Secondary | ICD-10-CM | POA: Diagnosis not present

## 2021-08-21 DIAGNOSIS — D631 Anemia in chronic kidney disease: Secondary | ICD-10-CM | POA: Diagnosis not present

## 2021-08-21 DIAGNOSIS — N184 Chronic kidney disease, stage 4 (severe): Secondary | ICD-10-CM | POA: Diagnosis not present

## 2021-08-21 DIAGNOSIS — E1165 Type 2 diabetes mellitus with hyperglycemia: Secondary | ICD-10-CM | POA: Diagnosis not present

## 2021-08-21 DIAGNOSIS — E1122 Type 2 diabetes mellitus with diabetic chronic kidney disease: Secondary | ICD-10-CM | POA: Diagnosis not present

## 2021-08-21 DIAGNOSIS — R69 Illness, unspecified: Secondary | ICD-10-CM | POA: Diagnosis not present

## 2021-08-21 DIAGNOSIS — I1310 Hypertensive heart and chronic kidney disease without heart failure, with stage 1 through stage 4 chronic kidney disease, or unspecified chronic kidney disease: Secondary | ICD-10-CM | POA: Diagnosis not present

## 2021-08-21 DIAGNOSIS — J449 Chronic obstructive pulmonary disease, unspecified: Secondary | ICD-10-CM | POA: Diagnosis not present

## 2021-08-21 DIAGNOSIS — I2510 Atherosclerotic heart disease of native coronary artery without angina pectoris: Secondary | ICD-10-CM | POA: Diagnosis not present

## 2021-08-21 DIAGNOSIS — R296 Repeated falls: Secondary | ICD-10-CM | POA: Diagnosis not present

## 2021-08-21 DIAGNOSIS — E039 Hypothyroidism, unspecified: Secondary | ICD-10-CM | POA: Diagnosis not present

## 2021-08-23 DIAGNOSIS — N184 Chronic kidney disease, stage 4 (severe): Secondary | ICD-10-CM | POA: Diagnosis not present

## 2021-08-23 DIAGNOSIS — D631 Anemia in chronic kidney disease: Secondary | ICD-10-CM | POA: Diagnosis not present

## 2021-08-23 DIAGNOSIS — E1122 Type 2 diabetes mellitus with diabetic chronic kidney disease: Secondary | ICD-10-CM | POA: Diagnosis not present

## 2021-08-23 DIAGNOSIS — E039 Hypothyroidism, unspecified: Secondary | ICD-10-CM | POA: Diagnosis not present

## 2021-08-23 DIAGNOSIS — I1310 Hypertensive heart and chronic kidney disease without heart failure, with stage 1 through stage 4 chronic kidney disease, or unspecified chronic kidney disease: Secondary | ICD-10-CM | POA: Diagnosis not present

## 2021-08-23 DIAGNOSIS — R69 Illness, unspecified: Secondary | ICD-10-CM | POA: Diagnosis not present

## 2021-08-23 DIAGNOSIS — J449 Chronic obstructive pulmonary disease, unspecified: Secondary | ICD-10-CM | POA: Diagnosis not present

## 2021-08-23 DIAGNOSIS — R296 Repeated falls: Secondary | ICD-10-CM | POA: Diagnosis not present

## 2021-08-23 DIAGNOSIS — E1165 Type 2 diabetes mellitus with hyperglycemia: Secondary | ICD-10-CM | POA: Diagnosis not present

## 2021-08-23 DIAGNOSIS — I2510 Atherosclerotic heart disease of native coronary artery without angina pectoris: Secondary | ICD-10-CM | POA: Diagnosis not present

## 2021-08-25 DIAGNOSIS — J449 Chronic obstructive pulmonary disease, unspecified: Secondary | ICD-10-CM | POA: Diagnosis not present

## 2021-08-25 DIAGNOSIS — E039 Hypothyroidism, unspecified: Secondary | ICD-10-CM | POA: Diagnosis not present

## 2021-08-25 DIAGNOSIS — R296 Repeated falls: Secondary | ICD-10-CM | POA: Diagnosis not present

## 2021-08-25 DIAGNOSIS — I2510 Atherosclerotic heart disease of native coronary artery without angina pectoris: Secondary | ICD-10-CM | POA: Diagnosis not present

## 2021-08-25 DIAGNOSIS — D631 Anemia in chronic kidney disease: Secondary | ICD-10-CM | POA: Diagnosis not present

## 2021-08-25 DIAGNOSIS — R69 Illness, unspecified: Secondary | ICD-10-CM | POA: Diagnosis not present

## 2021-08-25 DIAGNOSIS — E1122 Type 2 diabetes mellitus with diabetic chronic kidney disease: Secondary | ICD-10-CM | POA: Diagnosis not present

## 2021-08-25 DIAGNOSIS — N184 Chronic kidney disease, stage 4 (severe): Secondary | ICD-10-CM | POA: Diagnosis not present

## 2021-08-25 DIAGNOSIS — I1310 Hypertensive heart and chronic kidney disease without heart failure, with stage 1 through stage 4 chronic kidney disease, or unspecified chronic kidney disease: Secondary | ICD-10-CM | POA: Diagnosis not present

## 2021-08-25 DIAGNOSIS — E1165 Type 2 diabetes mellitus with hyperglycemia: Secondary | ICD-10-CM | POA: Diagnosis not present

## 2021-08-27 ENCOUNTER — Other Ambulatory Visit: Payer: Self-pay | Admitting: Internal Medicine

## 2021-08-28 ENCOUNTER — Other Ambulatory Visit: Payer: Self-pay

## 2021-08-31 DIAGNOSIS — R310 Gross hematuria: Secondary | ICD-10-CM | POA: Diagnosis not present

## 2021-08-31 DIAGNOSIS — R339 Retention of urine, unspecified: Secondary | ICD-10-CM | POA: Diagnosis not present

## 2021-08-31 DIAGNOSIS — N390 Urinary tract infection, site not specified: Secondary | ICD-10-CM | POA: Diagnosis not present

## 2021-08-31 DIAGNOSIS — I69398 Other sequelae of cerebral infarction: Secondary | ICD-10-CM | POA: Diagnosis not present

## 2021-08-31 DIAGNOSIS — N319 Neuromuscular dysfunction of bladder, unspecified: Secondary | ICD-10-CM | POA: Diagnosis not present

## 2021-08-31 DIAGNOSIS — N3946 Mixed incontinence: Secondary | ICD-10-CM | POA: Diagnosis not present

## 2021-08-31 DIAGNOSIS — N184 Chronic kidney disease, stage 4 (severe): Secondary | ICD-10-CM | POA: Diagnosis not present

## 2021-09-01 ENCOUNTER — Ambulatory Visit: Payer: Medicare HMO | Admitting: Cardiology

## 2021-09-02 ENCOUNTER — Other Ambulatory Visit: Payer: Self-pay | Admitting: Cardiology

## 2021-09-02 DIAGNOSIS — Z6828 Body mass index (BMI) 28.0-28.9, adult: Secondary | ICD-10-CM | POA: Diagnosis not present

## 2021-09-02 DIAGNOSIS — Z23 Encounter for immunization: Secondary | ICD-10-CM | POA: Diagnosis not present

## 2021-09-02 DIAGNOSIS — D649 Anemia, unspecified: Secondary | ICD-10-CM | POA: Diagnosis not present

## 2021-09-02 DIAGNOSIS — I10 Essential (primary) hypertension: Secondary | ICD-10-CM | POA: Diagnosis not present

## 2021-09-02 NOTE — Telephone Encounter (Signed)
Brilinta 90 mg tablets # 180 x 3 refills sent to  [facility], [address] AT [place]

## 2021-09-03 DIAGNOSIS — E1165 Type 2 diabetes mellitus with hyperglycemia: Secondary | ICD-10-CM | POA: Diagnosis not present

## 2021-09-03 DIAGNOSIS — I1310 Hypertensive heart and chronic kidney disease without heart failure, with stage 1 through stage 4 chronic kidney disease, or unspecified chronic kidney disease: Secondary | ICD-10-CM | POA: Diagnosis not present

## 2021-09-03 DIAGNOSIS — R69 Illness, unspecified: Secondary | ICD-10-CM | POA: Diagnosis not present

## 2021-09-03 DIAGNOSIS — I2510 Atherosclerotic heart disease of native coronary artery without angina pectoris: Secondary | ICD-10-CM | POA: Diagnosis not present

## 2021-09-03 DIAGNOSIS — D631 Anemia in chronic kidney disease: Secondary | ICD-10-CM | POA: Diagnosis not present

## 2021-09-03 DIAGNOSIS — E1122 Type 2 diabetes mellitus with diabetic chronic kidney disease: Secondary | ICD-10-CM | POA: Diagnosis not present

## 2021-09-03 DIAGNOSIS — R296 Repeated falls: Secondary | ICD-10-CM | POA: Diagnosis not present

## 2021-09-03 DIAGNOSIS — E039 Hypothyroidism, unspecified: Secondary | ICD-10-CM | POA: Diagnosis not present

## 2021-09-03 DIAGNOSIS — N184 Chronic kidney disease, stage 4 (severe): Secondary | ICD-10-CM | POA: Diagnosis not present

## 2021-09-03 DIAGNOSIS — J449 Chronic obstructive pulmonary disease, unspecified: Secondary | ICD-10-CM | POA: Diagnosis not present

## 2021-09-08 DIAGNOSIS — N184 Chronic kidney disease, stage 4 (severe): Secondary | ICD-10-CM | POA: Diagnosis not present

## 2021-09-08 DIAGNOSIS — I1310 Hypertensive heart and chronic kidney disease without heart failure, with stage 1 through stage 4 chronic kidney disease, or unspecified chronic kidney disease: Secondary | ICD-10-CM | POA: Diagnosis not present

## 2021-09-08 DIAGNOSIS — D631 Anemia in chronic kidney disease: Secondary | ICD-10-CM | POA: Diagnosis not present

## 2021-09-08 DIAGNOSIS — R69 Illness, unspecified: Secondary | ICD-10-CM | POA: Diagnosis not present

## 2021-09-08 DIAGNOSIS — I2510 Atherosclerotic heart disease of native coronary artery without angina pectoris: Secondary | ICD-10-CM | POA: Diagnosis not present

## 2021-09-08 DIAGNOSIS — E1165 Type 2 diabetes mellitus with hyperglycemia: Secondary | ICD-10-CM | POA: Diagnosis not present

## 2021-09-08 DIAGNOSIS — R296 Repeated falls: Secondary | ICD-10-CM | POA: Diagnosis not present

## 2021-09-08 DIAGNOSIS — E1122 Type 2 diabetes mellitus with diabetic chronic kidney disease: Secondary | ICD-10-CM | POA: Diagnosis not present

## 2021-09-08 DIAGNOSIS — J449 Chronic obstructive pulmonary disease, unspecified: Secondary | ICD-10-CM | POA: Diagnosis not present

## 2021-09-08 DIAGNOSIS — E039 Hypothyroidism, unspecified: Secondary | ICD-10-CM | POA: Diagnosis not present

## 2021-09-15 DIAGNOSIS — E119 Type 2 diabetes mellitus without complications: Secondary | ICD-10-CM | POA: Diagnosis not present

## 2021-09-15 DIAGNOSIS — Z7985 Long-term (current) use of injectable non-insulin antidiabetic drugs: Secondary | ICD-10-CM | POA: Diagnosis not present

## 2021-09-15 DIAGNOSIS — Z794 Long term (current) use of insulin: Secondary | ICD-10-CM | POA: Diagnosis not present

## 2021-10-05 DIAGNOSIS — D649 Anemia, unspecified: Secondary | ICD-10-CM | POA: Diagnosis not present

## 2021-10-29 DIAGNOSIS — R710 Precipitous drop in hematocrit: Secondary | ICD-10-CM | POA: Diagnosis not present

## 2021-11-20 ENCOUNTER — Ambulatory Visit: Payer: Medicare HMO | Admitting: Cardiology

## 2021-12-14 ENCOUNTER — Other Ambulatory Visit: Payer: Self-pay

## 2021-12-15 ENCOUNTER — Encounter: Payer: Self-pay | Admitting: Cardiology

## 2021-12-15 ENCOUNTER — Ambulatory Visit: Payer: Medicare HMO | Admitting: Cardiology

## 2021-12-15 ENCOUNTER — Other Ambulatory Visit: Payer: Self-pay

## 2021-12-15 VITALS — BP 120/64 | HR 71 | Ht 61.0 in | Wt 155.0 lb

## 2021-12-15 DIAGNOSIS — E1159 Type 2 diabetes mellitus with other circulatory complications: Secondary | ICD-10-CM | POA: Diagnosis not present

## 2021-12-15 DIAGNOSIS — I2510 Atherosclerotic heart disease of native coronary artery without angina pectoris: Secondary | ICD-10-CM | POA: Diagnosis not present

## 2021-12-15 DIAGNOSIS — Z794 Long term (current) use of insulin: Secondary | ICD-10-CM | POA: Diagnosis not present

## 2021-12-15 DIAGNOSIS — I10 Essential (primary) hypertension: Secondary | ICD-10-CM | POA: Diagnosis not present

## 2021-12-15 DIAGNOSIS — Z951 Presence of aortocoronary bypass graft: Secondary | ICD-10-CM | POA: Diagnosis not present

## 2021-12-15 DIAGNOSIS — I739 Peripheral vascular disease, unspecified: Secondary | ICD-10-CM

## 2021-12-15 NOTE — Patient Instructions (Signed)
Medication Instructions:  Your physician recommends that you continue on your current medications as directed. Please refer to the Current Medication list given to you today.  *If you need a refill on your cardiac medications before your next appointment, please call your pharmacy*   Lab Work: None If you have labs (blood work) drawn today and your tests are completely normal, you will receive your results only by: [place] (if you have MyChart) OR A paper copy in the mail If you have any lab test that is abnormal or we need to change your treatment, we will call you to review the results.   Testing/Procedures: Your physician has requested that you have an echocardiogram. Echocardiography is a painless test that uses sound waves to create images of your heart. It provides your doctor with information about the size and shape of your heart and how well your hearts chambers and valves are working. This procedure takes approximately one hour. There are no restrictions for this procedure.  Your physician has requested that you have a carotid duplex. This test is an ultrasound of the carotid arteries in your neck. It looks at blood flow through these arteries that supply the brain with blood. Allow one hour for this exam. There are no restrictions or special instructions.    Follow-Up: At [facility], you and your health needs are our priority.  As part of our continuing mission to provide you with exceptional heart care, we have created designated Provider Care Teams.  These Care Teams include your primary Cardiologist (physician) and Advanced Practice Providers (APPs -  Physician Assistants and Nurse Practitioners) who all work together to provide you with the care you need, when you need it.  We recommend signing up for the patient portal called "MyChart".  Sign up information is provided on this After Visit Summary.  MyChart is used to connect with patients for Virtual Visits  (Telemedicine).  Patients are able to view lab/test results, encounter notes, upcoming appointments, etc.  Non-urgent messages can be sent to your provider as well.   To learn more about what you can do with MyChart, go to [email or web address].    Your next appointment:   6 month(s)  The format for your next appointment:   In Person  Provider:   Jenne Campus, MD    Other Instructions Echocardiogram An echocardiogram is a test that uses sound waves (ultrasound) to produce images of the heart. Images from an echocardiogram can provide important information about: Heart size and shape. The size and thickness and movement of your heart's walls. Heart muscle function and strength. Heart valve function or if you have stenosis. Stenosis is when the heart valves are too narrow. If blood is flowing backward through the heart valves (regurgitation). A tumor or infectious growth around the heart valves. Areas of heart muscle that are not working well because of poor blood flow or injury from a heart attack. Aneurysm detection. An aneurysm is a weak or damaged part of an artery wall. The wall bulges out from the normal force of blood pumping through the body. Tell a health care provider about: Any allergies you have. All medicines you are taking, including vitamins, herbs, eye drops, creams, and over-the-counter medicines. Any blood disorders you have. Any surgeries you have had. Any medical conditions you have. Whether you are pregnant or may be pregnant. What are the risks? Generally, this is a safe test. However, problems may occur, including an allergic reaction to dye (contrast) that may  be used during the test. What happens before the test? No specific preparation is needed. You may eat and drink normally. What happens during the test?  You will take off your clothes from the waist up and put on a hospital gown. Electrodes or electrocardiogram (ECG)patches may be placed on  your chest. The electrodes or patches are then connected to a device that monitors your heart rate and rhythm. You will lie down on a table for an ultrasound exam. A gel will be applied to your chest to help sound waves pass through your skin. A handheld device, called a transducer, will be pressed against your chest and moved over your heart. The transducer produces sound waves that travel to your heart and bounce back (or "echo" back) to the transducer. These sound waves will be captured in real-time and changed into images of your heart that can be viewed on a video monitor. The images will be recorded on a computer and reviewed by your health care provider. You may be asked to change positions or hold your breath for a short time. This makes it easier to get different views or better views of your heart. In some cases, you may receive contrast through an IV in one of your veins. This can improve the quality of the pictures from your heart. The procedure may vary among health care providers and hospitals. What can I expect after the test? You may return to your normal, everyday life, including diet, activities, and medicines, unless your health care provider tells you not to do that. Follow these instructions at home: It is up to you to get the results of your test. Ask your health care provider, or the department that is doing the test, when your results will be ready. Keep all follow-up visits. This is important. Summary An echocardiogram is a test that uses sound waves (ultrasound) to produce images of the heart. Images from an echocardiogram can provide important information about the size and shape of your heart, heart muscle function, heart valve function, and other possible heart problems. You do not need to do anything to prepare before this test. You may eat and drink normally. After the echocardiogram is completed, you may return to your normal, everyday life, unless your health care  provider tells you not to do that. This information is not intended to replace advice given to you by your health care provider. Make sure you discuss any questions you have with your health care provider. Document Revised: 07/29/2021 Document Reviewed: 07/08/2020 Elsevier Patient Education  2022 [employer].

## 2021-12-15 NOTE — Progress Notes (Signed)
Cardiology Office Note:    Date:  12/15/2021   ID:  GENIE WENKE, DOB Jul 09, 1946, MRN [id number]  PCP:  Penelope Coop, FNP  Cardiologist:  Jenne Campus, MD    Referring MD: Penelope Coop, FNP   Chief Complaint  Patient presents with   Follow-up  Being in the hospital many times since I seen you last time  History of Present Illness:    AZRAEL HUSS is a 76 y.o. female  with past medical history significant for coronary disease, status post coronary artery bypass grafting 2012, now she does have 2 occluded grafts, she did have a stress test done couple months ago in the hospital showed no evidence of ischemia.  Other past medical history is include diabetes which is poorly controlled, dyslipidemia with now with PSK 9 agent is excellently controlled, she also history of CVA she has been on dual antiplatelet therapy since then She comes today to my office for follow-up.  She feels fine denies have any chest pain tightness squeezing pressure burning chest.  She did have multiple visits in the hospital because of poorly controlled diabetes that is getting under control.  Her dyslipidemia is perfectly controlled.  Denies having cardiac complaints except weakness fatigue and tiredness.  Past Medical History:  Diagnosis Date   Abnormal stress test 08/09/2017   Formatting of this note might be different from the original. Added automatically from request for surgery [phone]   Acute renal failure ([place]) 08/16/2014   Asthma    Asthma, chronic 08/16/2014   Benign essential hypertension 08/09/2017   CAD (coronary artery disease), native coronary artery    h/o CABG, stent   Chest pain 08/09/2017   Chronic coronary artery disease 04/16/2019   Formatting of this note might be different from the original. 3-Vessel   COPD (chronic obstructive pulmonary disease) ([place]) 10/13/2018   DM type 2 (diabetes mellitus, type 2) ([place])    Enteritis due to Clostridium difficile 08/17/2014   Essential  hypertension, benign 08/16/2014   GERD (gastroesophageal reflux disease)    Hx of CABG 08/09/2017   Hyperkalemia, mild 08/16/2014   Hyperlipidemia    Hypertension    Hypoglycemia 08/16/2014   Hypothyroidism    Late effect of cerebrovascular accident (CVA) 03/18/2020   Long-term use of aspirin therapy 03/25/622   Metabolic acidosis 7/62/8315   Mixed hyperlipidemia 08/09/2017   Morbid obesity ([place]) 08/16/2014   Myocardial infarction [facility])    PAD (peripheral artery disease) ([place]) 08/09/2017   Peripheral vascular disease ([place]) 05/12/2020   Pre-op evaluation 06/29/2016   Shortness of breath    Strep pharyngitis 08/16/2014   Type 2 diabetes mellitus with circulatory disorder, with long-term current use of insulin ([place]) 09/06/2017   Vomiting and diarrhea 08/16/2014    Past Surgical History:  Procedure Laterality Date   CORONARY ARTERY BYPASS GRAFT     VAGINAL HYSTERECTOMY     WRIST SURGERY      Current Medications: Current Meds  Medication Sig   Ascorbic Acid (VITAMIN C PO) Take 2,000 mg by mouth daily.   BRILINTA 90 MG TABS tablet TAKE 1 TABLET(90 MG) BY MOUTH TWICE DAILY   budesonide-formoterol (SYMBICORT) 160-4.5 MCG/ACT inhaler Inhale 2 puffs into the lungs 2 (two) times daily.   Cyanocobalamin (VITAMIN B 12 PO) Take 1,000 mcg by mouth daily.   diclofenac Sodium (VOLTAREN) 1 % GEL Apply 2 g topically 4 (four) times daily.   ezetimibe (ZETIA) 10 MG tablet Take 10 mg by mouth daily.  feeding supplement, GLUCERNA SHAKE, (GLUCERNA SHAKE) LIQD Take 237 mLs by mouth 3 (three) times daily between meals.   ferrous sulfate 325 (65 FE) MG tablet Take 325 mg by mouth daily with breakfast.   furosemide (LASIX) 40 MG tablet Take 20 mg by mouth daily as needed for fluid.   insulin aspart (NOVOLOG) 100 UNIT/ML injection Inject 0-20 Units into the skin 3 (three) times daily with meals.   insulin aspart (NOVOLOG) 100 UNIT/ML injection Inject 0-5 Units into the skin at bedtime.   Insulin Glargine w/ Trans  Port (BASAGLAR TEMPO PEN) 100 UNIT/ML SOPN Inject 70 Units into the skin in the morning.   ipratropium-albuterol (DUONEB) 0.5-2.5 (3) MG/3ML SOLN Take 3 mLs by nebulization 4 (four) times daily as needed (shortness of breath/wheezing).   isosorbide mononitrate (IMDUR) 120 MG 24 hr tablet TAKE 1 TABLET(120 MG) BY MOUTH DAILY   ketoconazole (NIZORAL) 2 % cream Apply 1 application topically daily as needed for irritation.   levothyroxine (SYNTHROID, LEVOTHROID) 50 MCG tablet Take 50 mcg by mouth daily before breakfast.   linagliptin (TRADJENTA) 5 MG TABS tablet Take 1 tablet (5 mg total) by mouth daily.   Melatonin 10 MG TABS Take 10 mg by mouth at bedtime.   metoprolol succinate (TOPROL-XL) 25 MG 24 hr tablet Take 3 tablets (75 mg total) by mouth daily. Take with or immediately following a meal.   multivitamin (RENA-VIT) TABS tablet Take 1 tablet by mouth at bedtime.   nitrofurantoin, macrocrystal-monohydrate, (MACROBID) 100 MG capsule Take 100 mg by mouth daily.   pantoprazole (PROTONIX) 40 MG tablet Take 40 mg by mouth daily.   polyethylene glycol powder (GLYCOLAX/MIRALAX) 17 GM/SCOOP powder Take 17 g by mouth daily as needed for constipation.   PRALUENT 150 MG/ML SOAJ INJECT 150 MG(1 ML) INTO THE SKIN EVERY 14 DAYS   QUEtiapine (SEROQUEL) 50 MG tablet Take 50 mg by mouth 2 (two) times daily.   rosuvastatin (CRESTOR) 20 MG tablet Take 1 tablet (20 mg total) by mouth daily.   TRULICITY 1.5 WE/3.1VQ SOPN Inject 0.5 mLs into the skin once a week.   Vitamin D, Ergocalciferol, (DRISDOL) 1.25 MG (50000 UNIT) CAPS capsule Take 50,000 Units by mouth once a week.     Allergies:   Atorvastatin   Social History   Socioeconomic History   Marital status: Widowed    Spouse name: Not on file   Number of children: Not on file   Years of education: Not on file   Highest education level: Not on file  Occupational History   Not on file  Tobacco Use   Smoking status: Former   Smokeless tobacco: Current     Types: Snuff  Substance and Sexual Activity   Alcohol use: No   Drug use: No   Sexual activity: Not on file  Other Topics Concern   Not on file  Social History Narrative   Not on file   Social Determinants of Health   Financial Resource Strain: Not on file  Food Insecurity: Not on file  Transportation Needs: Not on file  Physical Activity: Not on file  Stress: Not on file  Social Connections: Not on file     Family History: The patient's family history includes Diabetes in her father; Heart disease in her brother and father. ROS:   Please see the history of present illness.    All 14 point review of systems negative except as described per history of present illness  EKGs/Labs/Other Studies Reviewed:  Recent Labs: 06/20/2021: ALT 24; TSH 3.881 06/23/2021: Magnesium 1.9 06/28/2021: BUN 35; Creatinine, Ser 1.58; Hemoglobin 8.7; Platelets 200; Potassium 4.8; Sodium 135  Recent Lipid Panel No results found for: CHOL, TRIG, HDL, CHOLHDL, VLDL, LDLCALC, LDLDIRECT  Physical Exam:    VS:  BP 120/64 (BP Location: Left Arm, Patient Position: Sitting, Cuff Size: Normal)    Pulse 71    Ht 5\' 1"  (1.549 m)    Wt 155 lb (70.3 kg)    SpO2 94%    BMI 29.29 kg/m     Wt Readings from Last 3 Encounters:  12/15/21 155 lb (70.3 kg)  06/30/21 143 lb 15.4 oz (65.3 kg)  10/29/20 145 lb (65.8 kg)     GEN:  Well nourished, well developed in no acute distress HEENT: Normal NECK: No JVD; No carotid bruits LYMPHATICS: No lymphadenopathy CARDIAC: RRR, no murmurs, no rubs, no gallops RESPIRATORY:  Clear to auscultation without rales, wheezing or rhonchi  ABDOMEN: Soft, non-tender, non-distended MUSCULOSKELETAL:  No edema; No deformity  SKIN: Warm and dry LOWER EXTREMITIES: no swelling NEUROLOGIC:  Alert and oriented x 3 PSYCHIATRIC:  Normal affect   ASSESSMENT:    1. Chronic coronary artery disease   2. PAD (peripheral artery disease) ([place])   3. Type 2 diabetes mellitus with  other circulatory complication, with long-term current use of insulin ([place])   4. Coronary artery disease involving native coronary artery of native heart without angina pectoris   5. Hx of CABG   6. Benign essential hypertension    PLAN:    In order of problems listed above:  Coronary disease.  Stable from that point review on appropriate medications which I will continue  peripheral vascular disease, we will repeat her carotic ultrasounds last time its done in May 2021 with up to 60% stenosis need to be repeated which we will do Dyslipidemia perfectly controlled with PCSK9 agent.  Last LDL I see is 20 desisted after reviewing K PN that is dated from April 2022  Benign essential hypertension blood pressure well controlled Because of fatigue tiredness and dyspnea on exertion I will ask her to have another echocardiogram performed to check left ventricle ejection fraction.   Medication Adjustments/Labs and Tests Ordered: Current medicines are reviewed at length with the patient today.  Concerns regarding medicines are outlined above.  No orders of the defined types were placed in this encounter.  Medication changes: No orders of the defined types were placed in this encounter.   Signed, Park Liter, MD, [facility] 12/15/2021 10:27 AM    [place]

## 2021-12-15 NOTE — Addendum Note (Signed)
Addended by: Edwyna Shell I on: 12/15/2021 10:39 AM   Modules accepted: Orders

## 2021-12-17 DIAGNOSIS — I10 Essential (primary) hypertension: Secondary | ICD-10-CM | POA: Diagnosis not present

## 2021-12-17 DIAGNOSIS — J449 Chronic obstructive pulmonary disease, unspecified: Secondary | ICD-10-CM | POA: Diagnosis not present

## 2021-12-17 DIAGNOSIS — E785 Hyperlipidemia, unspecified: Secondary | ICD-10-CM | POA: Diagnosis not present

## 2021-12-17 DIAGNOSIS — Z6829 Body mass index (BMI) 29.0-29.9, adult: Secondary | ICD-10-CM | POA: Diagnosis not present

## 2021-12-17 DIAGNOSIS — E1165 Type 2 diabetes mellitus with hyperglycemia: Secondary | ICD-10-CM | POA: Diagnosis not present

## 2021-12-17 DIAGNOSIS — D649 Anemia, unspecified: Secondary | ICD-10-CM | POA: Diagnosis not present

## 2021-12-17 DIAGNOSIS — E538 Deficiency of other specified B group vitamins: Secondary | ICD-10-CM | POA: Diagnosis not present

## 2021-12-17 DIAGNOSIS — E559 Vitamin D deficiency, unspecified: Secondary | ICD-10-CM | POA: Diagnosis not present

## 2021-12-17 DIAGNOSIS — I509 Heart failure, unspecified: Secondary | ICD-10-CM | POA: Diagnosis not present

## 2021-12-17 DIAGNOSIS — N189 Chronic kidney disease, unspecified: Secondary | ICD-10-CM | POA: Diagnosis not present

## 2021-12-24 ENCOUNTER — Other Ambulatory Visit: Payer: Self-pay

## 2021-12-24 ENCOUNTER — Ambulatory Visit (INDEPENDENT_AMBULATORY_CARE_PROVIDER_SITE_OTHER): Payer: Medicare HMO

## 2021-12-24 DIAGNOSIS — Z794 Long term (current) use of insulin: Secondary | ICD-10-CM | POA: Diagnosis not present

## 2021-12-24 DIAGNOSIS — I739 Peripheral vascular disease, unspecified: Secondary | ICD-10-CM

## 2021-12-24 DIAGNOSIS — I2510 Atherosclerotic heart disease of native coronary artery without angina pectoris: Secondary | ICD-10-CM

## 2021-12-24 DIAGNOSIS — E1159 Type 2 diabetes mellitus with other circulatory complications: Secondary | ICD-10-CM | POA: Diagnosis not present

## 2021-12-24 DIAGNOSIS — Z951 Presence of aortocoronary bypass graft: Secondary | ICD-10-CM

## 2021-12-24 DIAGNOSIS — I6523 Occlusion and stenosis of bilateral carotid arteries: Secondary | ICD-10-CM

## 2021-12-24 DIAGNOSIS — I10 Essential (primary) hypertension: Secondary | ICD-10-CM

## 2021-12-24 LAB — ECHOCARDIOGRAM COMPLETE
Area-P 1/2: 2.26 cm2
S' Lateral: 2.7 cm

## 2021-12-25 DIAGNOSIS — N189 Chronic kidney disease, unspecified: Secondary | ICD-10-CM | POA: Diagnosis not present

## 2021-12-25 DIAGNOSIS — E875 Hyperkalemia: Secondary | ICD-10-CM | POA: Diagnosis not present

## 2021-12-30 ENCOUNTER — Telehealth: Payer: Self-pay

## 2021-12-30 NOTE — Telephone Encounter (Signed)
Spoke to [facility] notified of results

## 2021-12-30 NOTE — Telephone Encounter (Signed)
-----   Message from Park Liter, MD sent at 12/25/2021 10:46 AM EST ----- Up to 59% stenosis both internal carotic arteries, medical therapy

## 2022-01-14 DIAGNOSIS — N390 Urinary tract infection, site not specified: Secondary | ICD-10-CM | POA: Diagnosis not present

## 2022-01-14 DIAGNOSIS — B3731 Acute candidiasis of vulva and vagina: Secondary | ICD-10-CM | POA: Diagnosis not present

## 2022-01-14 DIAGNOSIS — R339 Retention of urine, unspecified: Secondary | ICD-10-CM | POA: Diagnosis not present

## 2022-01-14 DIAGNOSIS — I69398 Other sequelae of cerebral infarction: Secondary | ICD-10-CM | POA: Diagnosis not present

## 2022-01-14 DIAGNOSIS — R310 Gross hematuria: Secondary | ICD-10-CM | POA: Diagnosis not present

## 2022-01-14 DIAGNOSIS — N3946 Mixed incontinence: Secondary | ICD-10-CM | POA: Diagnosis not present

## 2022-01-14 DIAGNOSIS — N184 Chronic kidney disease, stage 4 (severe): Secondary | ICD-10-CM | POA: Diagnosis not present

## 2022-01-14 DIAGNOSIS — N319 Neuromuscular dysfunction of bladder, unspecified: Secondary | ICD-10-CM | POA: Diagnosis not present

## 2022-01-25 ENCOUNTER — Other Ambulatory Visit: Payer: Self-pay | Admitting: Cardiology

## 2022-02-18 DIAGNOSIS — E785 Hyperlipidemia, unspecified: Secondary | ICD-10-CM | POA: Diagnosis not present

## 2022-02-18 DIAGNOSIS — E1159 Type 2 diabetes mellitus with other circulatory complications: Secondary | ICD-10-CM | POA: Diagnosis not present

## 2022-02-18 DIAGNOSIS — I2510 Atherosclerotic heart disease of native coronary artery without angina pectoris: Secondary | ICD-10-CM | POA: Diagnosis not present

## 2022-02-18 DIAGNOSIS — I10 Essential (primary) hypertension: Secondary | ICD-10-CM | POA: Diagnosis not present

## 2022-02-18 DIAGNOSIS — Z7985 Long-term (current) use of injectable non-insulin antidiabetic drugs: Secondary | ICD-10-CM | POA: Diagnosis not present

## 2022-02-18 DIAGNOSIS — Z794 Long term (current) use of insulin: Secondary | ICD-10-CM | POA: Diagnosis not present

## 2022-03-11 ENCOUNTER — Ambulatory Visit: Payer: Medicare HMO | Admitting: Cardiology

## 2022-03-30 DIAGNOSIS — Z9181 History of falling: Secondary | ICD-10-CM | POA: Diagnosis not present

## 2022-03-30 DIAGNOSIS — I509 Heart failure, unspecified: Secondary | ICD-10-CM | POA: Diagnosis not present

## 2022-03-30 DIAGNOSIS — D649 Anemia, unspecified: Secondary | ICD-10-CM | POA: Diagnosis not present

## 2022-03-30 DIAGNOSIS — E559 Vitamin D deficiency, unspecified: Secondary | ICD-10-CM | POA: Diagnosis not present

## 2022-03-30 DIAGNOSIS — E785 Hyperlipidemia, unspecified: Secondary | ICD-10-CM | POA: Diagnosis not present

## 2022-03-30 DIAGNOSIS — Z6829 Body mass index (BMI) 29.0-29.9, adult: Secondary | ICD-10-CM | POA: Diagnosis not present

## 2022-03-30 DIAGNOSIS — E538 Deficiency of other specified B group vitamins: Secondary | ICD-10-CM | POA: Diagnosis not present

## 2022-03-30 DIAGNOSIS — Z1231 Encounter for screening mammogram for malignant neoplasm of breast: Secondary | ICD-10-CM | POA: Diagnosis not present

## 2022-03-30 DIAGNOSIS — N189 Chronic kidney disease, unspecified: Secondary | ICD-10-CM | POA: Diagnosis not present

## 2022-03-30 DIAGNOSIS — E1165 Type 2 diabetes mellitus with hyperglycemia: Secondary | ICD-10-CM | POA: Diagnosis not present

## 2022-03-30 DIAGNOSIS — J449 Chronic obstructive pulmonary disease, unspecified: Secondary | ICD-10-CM | POA: Diagnosis not present

## 2022-03-30 DIAGNOSIS — I10 Essential (primary) hypertension: Secondary | ICD-10-CM | POA: Diagnosis not present

## 2022-03-31 DIAGNOSIS — Z888 Allergy status to other drugs, medicaments and biological substances status: Secondary | ICD-10-CM | POA: Diagnosis not present

## 2022-03-31 DIAGNOSIS — Z951 Presence of aortocoronary bypass graft: Secondary | ICD-10-CM | POA: Diagnosis not present

## 2022-03-31 DIAGNOSIS — N390 Urinary tract infection, site not specified: Secondary | ICD-10-CM | POA: Diagnosis not present

## 2022-03-31 DIAGNOSIS — I4891 Unspecified atrial fibrillation: Secondary | ICD-10-CM | POA: Diagnosis not present

## 2022-03-31 DIAGNOSIS — E1110 Type 2 diabetes mellitus with ketoacidosis without coma: Secondary | ICD-10-CM | POA: Diagnosis not present

## 2022-03-31 DIAGNOSIS — I252 Old myocardial infarction: Secondary | ICD-10-CM | POA: Diagnosis not present

## 2022-03-31 DIAGNOSIS — E875 Hyperkalemia: Secondary | ICD-10-CM | POA: Diagnosis not present

## 2022-03-31 DIAGNOSIS — E1122 Type 2 diabetes mellitus with diabetic chronic kidney disease: Secondary | ICD-10-CM | POA: Diagnosis not present

## 2022-03-31 DIAGNOSIS — N189 Chronic kidney disease, unspecified: Secondary | ICD-10-CM | POA: Diagnosis not present

## 2022-03-31 DIAGNOSIS — J449 Chronic obstructive pulmonary disease, unspecified: Secondary | ICD-10-CM | POA: Diagnosis not present

## 2022-03-31 DIAGNOSIS — Z87891 Personal history of nicotine dependence: Secondary | ICD-10-CM | POA: Diagnosis not present

## 2022-03-31 DIAGNOSIS — M1990 Unspecified osteoarthritis, unspecified site: Secondary | ICD-10-CM | POA: Diagnosis not present

## 2022-03-31 DIAGNOSIS — I130 Hypertensive heart and chronic kidney disease with heart failure and stage 1 through stage 4 chronic kidney disease, or unspecified chronic kidney disease: Secondary | ICD-10-CM | POA: Diagnosis not present

## 2022-03-31 DIAGNOSIS — Z7982 Long term (current) use of aspirin: Secondary | ICD-10-CM | POA: Diagnosis not present

## 2022-03-31 DIAGNOSIS — E039 Hypothyroidism, unspecified: Secondary | ICD-10-CM | POA: Diagnosis not present

## 2022-03-31 DIAGNOSIS — I129 Hypertensive chronic kidney disease with stage 1 through stage 4 chronic kidney disease, or unspecified chronic kidney disease: Secondary | ICD-10-CM | POA: Diagnosis not present

## 2022-03-31 DIAGNOSIS — Z8673 Personal history of transient ischemic attack (TIA), and cerebral infarction without residual deficits: Secondary | ICD-10-CM | POA: Diagnosis not present

## 2022-03-31 DIAGNOSIS — D649 Anemia, unspecified: Secondary | ICD-10-CM | POA: Diagnosis not present

## 2022-03-31 DIAGNOSIS — I509 Heart failure, unspecified: Secondary | ICD-10-CM | POA: Diagnosis not present

## 2022-03-31 DIAGNOSIS — Z794 Long term (current) use of insulin: Secondary | ICD-10-CM | POA: Diagnosis not present

## 2022-03-31 DIAGNOSIS — F419 Anxiety disorder, unspecified: Secondary | ICD-10-CM | POA: Diagnosis not present

## 2022-03-31 DIAGNOSIS — N184 Chronic kidney disease, stage 4 (severe): Secondary | ICD-10-CM | POA: Diagnosis not present

## 2022-03-31 DIAGNOSIS — R5383 Other fatigue: Secondary | ICD-10-CM | POA: Diagnosis not present

## 2022-03-31 DIAGNOSIS — Z79899 Other long term (current) drug therapy: Secondary | ICD-10-CM | POA: Diagnosis not present

## 2022-03-31 DIAGNOSIS — E785 Hyperlipidemia, unspecified: Secondary | ICD-10-CM | POA: Diagnosis not present

## 2022-03-31 DIAGNOSIS — I2510 Atherosclerotic heart disease of native coronary artery without angina pectoris: Secondary | ICD-10-CM | POA: Diagnosis not present

## 2022-03-31 DIAGNOSIS — E1165 Type 2 diabetes mellitus with hyperglycemia: Secondary | ICD-10-CM | POA: Diagnosis not present

## 2022-04-01 DIAGNOSIS — I130 Hypertensive heart and chronic kidney disease with heart failure and stage 1 through stage 4 chronic kidney disease, or unspecified chronic kidney disease: Secondary | ICD-10-CM | POA: Diagnosis not present

## 2022-04-01 DIAGNOSIS — N390 Urinary tract infection, site not specified: Secondary | ICD-10-CM | POA: Diagnosis not present

## 2022-04-01 DIAGNOSIS — N184 Chronic kidney disease, stage 4 (severe): Secondary | ICD-10-CM | POA: Diagnosis not present

## 2022-04-02 DIAGNOSIS — I130 Hypertensive heart and chronic kidney disease with heart failure and stage 1 through stage 4 chronic kidney disease, or unspecified chronic kidney disease: Secondary | ICD-10-CM | POA: Diagnosis not present

## 2022-04-02 DIAGNOSIS — N184 Chronic kidney disease, stage 4 (severe): Secondary | ICD-10-CM | POA: Diagnosis not present

## 2022-04-02 DIAGNOSIS — N390 Urinary tract infection, site not specified: Secondary | ICD-10-CM | POA: Diagnosis not present

## 2022-04-06 DIAGNOSIS — E1159 Type 2 diabetes mellitus with other circulatory complications: Secondary | ICD-10-CM | POA: Diagnosis not present

## 2022-04-26 ENCOUNTER — Other Ambulatory Visit: Payer: Self-pay | Admitting: Internal Medicine

## 2022-04-27 DIAGNOSIS — Z1231 Encounter for screening mammogram for malignant neoplasm of breast: Secondary | ICD-10-CM | POA: Diagnosis not present

## 2022-05-07 DIAGNOSIS — E1159 Type 2 diabetes mellitus with other circulatory complications: Secondary | ICD-10-CM | POA: Diagnosis not present

## 2022-06-06 DIAGNOSIS — E1159 Type 2 diabetes mellitus with other circulatory complications: Secondary | ICD-10-CM | POA: Diagnosis not present

## 2022-06-27 ENCOUNTER — Other Ambulatory Visit: Payer: Self-pay | Admitting: Cardiology

## 2022-07-07 DIAGNOSIS — E1159 Type 2 diabetes mellitus with other circulatory complications: Secondary | ICD-10-CM | POA: Diagnosis not present

## 2022-07-07 DIAGNOSIS — Z6829 Body mass index (BMI) 29.0-29.9, adult: Secondary | ICD-10-CM | POA: Diagnosis not present

## 2022-07-07 DIAGNOSIS — E785 Hyperlipidemia, unspecified: Secondary | ICD-10-CM | POA: Diagnosis not present

## 2022-07-07 DIAGNOSIS — I10 Essential (primary) hypertension: Secondary | ICD-10-CM | POA: Diagnosis not present

## 2022-07-07 DIAGNOSIS — E1165 Type 2 diabetes mellitus with hyperglycemia: Secondary | ICD-10-CM | POA: Diagnosis not present

## 2022-07-07 DIAGNOSIS — J449 Chronic obstructive pulmonary disease, unspecified: Secondary | ICD-10-CM | POA: Diagnosis not present

## 2022-07-07 DIAGNOSIS — I509 Heart failure, unspecified: Secondary | ICD-10-CM | POA: Diagnosis not present

## 2022-07-07 DIAGNOSIS — Z139 Encounter for screening, unspecified: Secondary | ICD-10-CM | POA: Diagnosis not present

## 2022-07-07 DIAGNOSIS — N189 Chronic kidney disease, unspecified: Secondary | ICD-10-CM | POA: Diagnosis not present

## 2022-07-09 DIAGNOSIS — Z0184 Encounter for antibody response examination: Secondary | ICD-10-CM | POA: Diagnosis not present

## 2022-08-07 DIAGNOSIS — E1159 Type 2 diabetes mellitus with other circulatory complications: Secondary | ICD-10-CM | POA: Diagnosis not present

## 2022-08-21 ENCOUNTER — Other Ambulatory Visit: Payer: Self-pay | Admitting: Cardiology

## 2022-08-26 DIAGNOSIS — E1165 Type 2 diabetes mellitus with hyperglycemia: Secondary | ICD-10-CM | POA: Diagnosis not present

## 2022-08-26 DIAGNOSIS — Z794 Long term (current) use of insulin: Secondary | ICD-10-CM | POA: Diagnosis not present

## 2022-08-26 DIAGNOSIS — I10 Essential (primary) hypertension: Secondary | ICD-10-CM | POA: Diagnosis not present

## 2022-08-26 DIAGNOSIS — E785 Hyperlipidemia, unspecified: Secondary | ICD-10-CM | POA: Diagnosis not present

## 2022-08-26 DIAGNOSIS — Z7985 Long-term (current) use of injectable non-insulin antidiabetic drugs: Secondary | ICD-10-CM | POA: Diagnosis not present

## 2022-09-06 DIAGNOSIS — E1159 Type 2 diabetes mellitus with other circulatory complications: Secondary | ICD-10-CM | POA: Diagnosis not present

## 2022-10-07 DIAGNOSIS — E1159 Type 2 diabetes mellitus with other circulatory complications: Secondary | ICD-10-CM | POA: Diagnosis not present

## 2022-10-27 DIAGNOSIS — E039 Hypothyroidism, unspecified: Secondary | ICD-10-CM | POA: Diagnosis not present

## 2022-10-27 DIAGNOSIS — J449 Chronic obstructive pulmonary disease, unspecified: Secondary | ICD-10-CM | POA: Diagnosis not present

## 2022-10-27 DIAGNOSIS — K2100 Gastro-esophageal reflux disease with esophagitis, without bleeding: Secondary | ICD-10-CM | POA: Diagnosis not present

## 2022-10-27 DIAGNOSIS — Z8673 Personal history of transient ischemic attack (TIA), and cerebral infarction without residual deficits: Secondary | ICD-10-CM | POA: Diagnosis not present

## 2022-10-27 DIAGNOSIS — N189 Chronic kidney disease, unspecified: Secondary | ICD-10-CM | POA: Diagnosis not present

## 2022-10-27 DIAGNOSIS — E1165 Type 2 diabetes mellitus with hyperglycemia: Secondary | ICD-10-CM | POA: Diagnosis not present

## 2022-10-27 DIAGNOSIS — E785 Hyperlipidemia, unspecified: Secondary | ICD-10-CM | POA: Diagnosis not present

## 2022-10-27 DIAGNOSIS — E559 Vitamin D deficiency, unspecified: Secondary | ICD-10-CM | POA: Diagnosis not present

## 2022-10-27 DIAGNOSIS — Z1331 Encounter for screening for depression: Secondary | ICD-10-CM | POA: Diagnosis not present

## 2022-10-27 DIAGNOSIS — Z6831 Body mass index (BMI) 31.0-31.9, adult: Secondary | ICD-10-CM | POA: Diagnosis not present

## 2022-10-27 DIAGNOSIS — Z23 Encounter for immunization: Secondary | ICD-10-CM | POA: Diagnosis not present

## 2022-10-27 DIAGNOSIS — I10 Essential (primary) hypertension: Secondary | ICD-10-CM | POA: Diagnosis not present

## 2022-10-28 ENCOUNTER — Encounter: Payer: Self-pay | Admitting: Cardiology

## 2022-10-28 ENCOUNTER — Ambulatory Visit: Payer: Medicare HMO | Attending: Cardiology | Admitting: Cardiology

## 2022-10-28 VITALS — BP 142/76 | HR 78 | Ht 61.0 in | Wt 165.0 lb

## 2022-10-28 DIAGNOSIS — I739 Peripheral vascular disease, unspecified: Secondary | ICD-10-CM

## 2022-10-28 DIAGNOSIS — I10 Essential (primary) hypertension: Secondary | ICD-10-CM | POA: Diagnosis not present

## 2022-10-28 DIAGNOSIS — E782 Mixed hyperlipidemia: Secondary | ICD-10-CM | POA: Diagnosis not present

## 2022-10-28 DIAGNOSIS — I2510 Atherosclerotic heart disease of native coronary artery without angina pectoris: Secondary | ICD-10-CM

## 2022-10-28 DIAGNOSIS — Z951 Presence of aortocoronary bypass graft: Secondary | ICD-10-CM | POA: Diagnosis not present

## 2022-10-28 DIAGNOSIS — Z794 Long term (current) use of insulin: Secondary | ICD-10-CM

## 2022-10-28 DIAGNOSIS — E785 Hyperlipidemia, unspecified: Secondary | ICD-10-CM

## 2022-10-28 DIAGNOSIS — E1159 Type 2 diabetes mellitus with other circulatory complications: Secondary | ICD-10-CM | POA: Diagnosis not present

## 2022-10-28 MED ORDER — ROSUVASTATIN CALCIUM 10 MG PO TABS: 10.0000 mg | ORAL_TABLET | Freq: Every day | ORAL | 3 refills | Status: DC

## 2022-10-28 NOTE — Progress Notes (Signed)
Cardiology Office Note:    Date:  10/28/2022   ID:  Tonya Richard, DOB 1946-04-19, MRN [id number]  PCP:  Penelope Coop, FNP  Cardiologist:  Jenne Campus, MD    Referring MD: Penelope Coop, FNP   Chief Complaint  Patient presents with   Follow-up  Doing well  History of Present Illness:    Tonya Richard is a 76 y.o. female  emale  with past medical history significant for coronary disease, status post coronary artery bypass grafting 2012, now she does have 2 occluded grafts, she did have a stress test done couple months ago in the hospital showed no evidence of ischemia.  Other past medical history is include diabetes which is poorly controlled, dyslipidemia with now with PSK 9 agent is excellently controlled, she also history of CVA she has been on dual antiplatelet therapy since then  She comes to my office today for follow-up and therapy she looks pretty good.  She described to have some fatigue tiredness shortness of breath but no chest pain tightness squeezing pressure burning chest no swelling of lower extremities  Past Medical History:  Diagnosis Date   Abnormal stress test 08/09/2017   Formatting of this note might be different from the original. Added automatically from request for surgery [phone]   Acute renal failure ([place]) 08/16/2014   Asthma    Asthma, chronic 08/16/2014   Benign essential hypertension 08/09/2017   CAD (coronary artery disease), native coronary artery    h/o CABG, stent   Chest pain 08/09/2017   Chronic coronary artery disease 04/16/2019   Formatting of this note might be different from the original. 3-Vessel   COPD (chronic obstructive pulmonary disease) ([place]) 10/13/2018   DM type 2 (diabetes mellitus, type 2) ([place])    Enteritis due to Clostridium difficile 08/17/2014   Essential hypertension, benign 08/16/2014   GERD (gastroesophageal reflux disease)    Hx of CABG 08/09/2017   Hyperkalemia, mild 08/16/2014   Hyperlipidemia    Hypertension     Hypoglycemia 08/16/2014   Hypothyroidism    Late effect of cerebrovascular accident (CVA) 03/18/2020   Long-term use of aspirin therapy 3/00/7622   Metabolic acidosis 6/33/3545   Mixed hyperlipidemia 08/09/2017   Morbid obesity ([place]) 08/16/2014   Myocardial infarction [facility])    PAD (peripheral artery disease) ([place]) 08/09/2017   Peripheral vascular disease ([place]) 05/12/2020   Pre-op evaluation 06/29/2016   Shortness of breath    Strep pharyngitis 08/16/2014   Type 2 diabetes mellitus with circulatory disorder, with long-term current use of insulin ([place]) 09/06/2017   Vomiting and diarrhea 08/16/2014    Past Surgical History:  Procedure Laterality Date   CORONARY ARTERY BYPASS GRAFT     VAGINAL HYSTERECTOMY     WRIST SURGERY      Current Medications: Current Meds  Medication Sig   Alirocumab (PRALUENT) 150 MG/ML SOAJ Inject 1 mL into the skin every 14 (fourteen) days.   BRILINTA 90 MG TABS tablet Take 1 tablet (90 mg total) by mouth 2 (two) times daily.   budesonide-formoterol (SYMBICORT) 160-4.5 MCG/ACT inhaler Inhale 2 puffs into the lungs 2 (two) times daily.   ezetimibe (ZETIA) 10 MG tablet Take 10 mg by mouth daily.   feeding supplement, GLUCERNA SHAKE, (GLUCERNA SHAKE) LIQD Take 237 mLs by mouth 3 (three) times daily between meals.   ferrous sulfate 325 (65 FE) MG tablet Take 325 mg by mouth daily with breakfast.   insulin aspart (NOVOLOG) 100 UNIT/ML injection Inject 0-20  Units into the skin 3 (three) times daily with meals.   insulin aspart (NOVOLOG) 100 UNIT/ML injection Inject 0-5 Units into the skin at bedtime.   Insulin Glargine w/ Trans Port (BASAGLAR TEMPO PEN) 100 UNIT/ML SOPN Inject 70 Units into the skin in the morning.   ipratropium-albuterol (DUONEB) 0.5-2.5 (3) MG/3ML SOLN Take 3 mLs by nebulization 4 (four) times daily as needed (shortness of breath/wheezing).   isosorbide mononitrate (IMDUR) 120 MG 24 hr tablet Take 1 tablet (120 mg total) by mouth daily.   levothyroxine  (SYNTHROID, LEVOTHROID) 50 MCG tablet Take 50 mcg by mouth daily before breakfast.   metoprolol succinate (TOPROL-XL) 25 MG 24 hr tablet Take 3 tablets (75 mg total) by mouth daily. Take with or immediately following a meal.   multivitamin (RENA-VIT) TABS tablet Take 1 tablet by mouth at bedtime.   pantoprazole (PROTONIX) 40 MG tablet Take 40 mg by mouth daily.   phenazopyridine (PYRIDIUM) 95 MG tablet Take 95 mg by mouth 3 (three) times daily as needed for pain.   polyethylene glycol powder (GLYCOLAX/MIRALAX) 17 GM/SCOOP powder Take 17 g by mouth daily as needed for constipation.   rosuvastatin (CRESTOR) 10 MG tablet Take 1 tablet (10 mg total) by mouth daily.   tamsulosin (FLOMAX) 0.4 MG CAPS capsule Take 0.4 mg by mouth daily after breakfast.   trimethoprim (TRIMPEX) 100 MG tablet Take 100 mg by mouth daily.   TRULICITY 1.5 FU/9.3AT SOPN Inject 0.5 mLs into the skin once a week.   Vitamin D, Ergocalciferol, (DRISDOL) 1.25 MG (50000 UNIT) CAPS capsule Take 50,000 Units by mouth once a week.   [DISCONTINUED] rosuvastatin (CRESTOR) 20 MG tablet TAKE 1 TABLET(20 MG) BY MOUTH DAILY     Allergies:   Atorvastatin   Social History   Socioeconomic History   Marital status: Widowed    Spouse name: Not on file   Number of children: Not on file   Years of education: Not on file   Highest education level: Not on file  Occupational History   Not on file  Tobacco Use   Smoking status: Former   Smokeless tobacco: Current    Types: Snuff  Substance and Sexual Activity   Alcohol use: No   Drug use: No   Sexual activity: Not on file  Other Topics Concern   Not on file  Social History Narrative   Not on file   Social Determinants of Health   Financial Resource Strain: Not on file  Food Insecurity: Not on file  Transportation Needs: Not on file  Physical Activity: Not on file  Stress: Not on file  Social Connections: Not on file     Family History: The patient's family history  includes Diabetes in her father; Heart disease in her brother and father. ROS:   Please see the history of present illness.    All 14 point review of systems negative except as described per history of present illness  EKGs/Labs/Other Studies Reviewed:      Recent Labs: No results found for requested labs within last 365 days.  Recent Lipid Panel No results found for: "CHOL", "TRIG", "HDL", "CHOLHDL", "VLDL", "LDLCALC", "LDLDIRECT"  Physical Exam:    VS:  BP (!) 142/76 (BP Location: Left Arm, Patient Position: Sitting)   Pulse 78   Ht '5\' 1"'$  (1.549 m)   Wt 165 lb (74.8 kg)   SpO2 95%   BMI 31.18 kg/m     Wt Readings from Last 3 Encounters:  10/28/22 165 lb (  74.8 kg)  12/15/21 155 lb (70.3 kg)  06/30/21 143 lb 15.4 oz (65.3 kg)     GEN:  Well nourished, well developed in no acute distress HEENT: Normal NECK: No JVD; No carotid bruits LYMPHATICS: No lymphadenopathy CARDIAC: RRR, no murmurs, no rubs, no gallops RESPIRATORY:  Clear to auscultation without rales, wheezing or rhonchi  ABDOMEN: Soft, non-tender, non-distended MUSCULOSKELETAL:  No edema; No deformity  SKIN: Warm and dry LOWER EXTREMITIES: no swelling NEUROLOGIC:  Alert and oriented x 3 PSYCHIATRIC:  Normal affect   ASSESSMENT:    1. PAD (peripheral artery disease) ([place])   2. Dyslipidemia, goal LDL below 70   3. Peripheral vascular disease ([place])   4. Coronary artery disease involving native coronary artery of native heart without angina pectoris   5. Benign essential hypertension   6. Type 2 diabetes mellitus with other circulatory complication, with long-term current use of insulin ([place])   7. Hx of CABG   8. Mixed hyperlipidemia    PLAN:    In order of problems listed above:  Coronary artery disease: Stable from that point review on appropriate medications.  Will continue present management. Peripheral vascular disease I will ask her to have repeated cardiac ultrasound to make sure that 60% stenosis  identified previously did not get any worse Dyslipidemia I did review K PN which show me her LDL of 4.  I think this is too low.  I will cut down her Crestor to 10 mg daily I will recheck fasting lipid profile in about 1 month. Benign essential hypertension seems to be well-controlled.   Medication Adjustments/Labs and Tests Ordered: Current medicines are reviewed at length with the patient today.  Concerns regarding medicines are outlined above.  Orders Placed This Encounter  Procedures   Lipid Profile   VAS US CAROTID   Medication changes:  Meds ordered this encounter  Medications   rosuvastatin (CRESTOR) 10 MG tablet    Sig: Take 1 tablet (10 mg total) by mouth daily.    Dispense:  90 tablet    Refill:  3    Signed, Park Liter, MD, [facility] 10/28/2022 12:22 PM    [facility]

## 2022-10-28 NOTE — Patient Instructions (Signed)
Medication Instructions:  Your physician has recommended you make the following change in your medication:   START: Crestor 10 mg daily  *If you need a refill on your cardiac medications before your next appointment, please call your pharmacy*   Lab Work: Your physician recommends that you return for lab work in:   Labs in 1 month: fasting lipids  If you have labs (blood work) drawn today and your tests are completely normal, you will receive your results only by: [place] (if you have [place]) OR A paper copy in the mail If you have any lab test that is abnormal or we need to change your treatment, we will call you to review the results.   Testing/Procedures: Your physician has requested that you have a carotid duplex. This test is an ultrasound of the carotid arteries in your neck. It looks at blood flow through these arteries that supply the brain with blood. Allow one hour for this exam. There are no restrictions or special instructions.    Follow-Up: At [facility], you and your health needs are our priority.  As part of our continuing mission to provide you with exceptional heart care, we have created designated Provider Care Teams.  These Care Teams include your primary Cardiologist (physician) and Advanced Practice Providers (APPs -  Physician Assistants and Nurse Practitioners) who all work together to provide you with the care you need, when you need it.  We recommend signing up for the patient portal called "MyChart".  Sign up information is provided on this After Visit Summary.  MyChart is used to connect with patients for Virtual Visits (Telemedicine).  Patients are able to view lab/test results, encounter notes, upcoming appointments, etc.  Non-urgent messages can be sent to your provider as well.   To learn more about what you can do with MyChart, go to [email or web address].    Your next appointment:   6 month(s)  The format for your next  appointment:   In Person  Provider:   Jenne Campus, MD    Other Instructions None  Important Information About Sugar

## 2022-11-01 ENCOUNTER — Ambulatory Visit: Payer: Medicare HMO | Attending: Cardiology

## 2022-11-01 DIAGNOSIS — E785 Hyperlipidemia, unspecified: Secondary | ICD-10-CM | POA: Diagnosis not present

## 2022-11-01 DIAGNOSIS — I6523 Occlusion and stenosis of bilateral carotid arteries: Secondary | ICD-10-CM | POA: Diagnosis not present

## 2022-11-01 DIAGNOSIS — I739 Peripheral vascular disease, unspecified: Secondary | ICD-10-CM | POA: Diagnosis not present

## 2022-11-03 ENCOUNTER — Other Ambulatory Visit: Payer: Self-pay | Admitting: Cardiology

## 2022-11-03 DIAGNOSIS — R319 Hematuria, unspecified: Secondary | ICD-10-CM | POA: Diagnosis not present

## 2022-11-03 DIAGNOSIS — N184 Chronic kidney disease, stage 4 (severe): Secondary | ICD-10-CM | POA: Diagnosis not present

## 2022-11-03 DIAGNOSIS — E161 Other hypoglycemia: Secondary | ICD-10-CM | POA: Diagnosis not present

## 2022-11-03 DIAGNOSIS — I130 Hypertensive heart and chronic kidney disease with heart failure and stage 1 through stage 4 chronic kidney disease, or unspecified chronic kidney disease: Secondary | ICD-10-CM | POA: Diagnosis not present

## 2022-11-03 DIAGNOSIS — E7800 Pure hypercholesterolemia, unspecified: Secondary | ICD-10-CM | POA: Diagnosis not present

## 2022-11-03 DIAGNOSIS — E11649 Type 2 diabetes mellitus with hypoglycemia without coma: Secondary | ICD-10-CM | POA: Diagnosis not present

## 2022-11-03 DIAGNOSIS — R4182 Altered mental status, unspecified: Secondary | ICD-10-CM | POA: Diagnosis not present

## 2022-11-03 DIAGNOSIS — I252 Old myocardial infarction: Secondary | ICD-10-CM | POA: Diagnosis not present

## 2022-11-03 DIAGNOSIS — I4891 Unspecified atrial fibrillation: Secondary | ICD-10-CM | POA: Diagnosis not present

## 2022-11-03 DIAGNOSIS — Z743 Need for continuous supervision: Secondary | ICD-10-CM | POA: Diagnosis not present

## 2022-11-03 DIAGNOSIS — E13649 Other specified diabetes mellitus with hypoglycemia without coma: Secondary | ICD-10-CM | POA: Diagnosis not present

## 2022-11-05 DIAGNOSIS — E875 Hyperkalemia: Secondary | ICD-10-CM | POA: Diagnosis not present

## 2022-11-06 DIAGNOSIS — E1159 Type 2 diabetes mellitus with other circulatory complications: Secondary | ICD-10-CM | POA: Diagnosis not present

## 2022-11-08 ENCOUNTER — Telehealth: Payer: Self-pay

## 2022-11-08 NOTE — Telephone Encounter (Signed)
Results reviewed with pt as per Dr. Krasowski's note.  Pt verbalized understanding and had no additional questions. Routed to PCP  

## 2022-11-19 ENCOUNTER — Telehealth: Payer: Self-pay

## 2022-11-19 NOTE — Telephone Encounter (Signed)
        Patient  visited [facility] on 11/03/2022  for treatment.   Telephone encounter attempt :  1st  A HIPAA compliant voice message was left requesting a return call.  Instructed patient to call back at [phone].   [place] Resource Care Guide   ??[email or web address]  ?? [phone]   Website: [email or web address]  [email or web address]

## 2022-11-25 ENCOUNTER — Telehealth: Payer: Self-pay

## 2022-11-25 ENCOUNTER — Other Ambulatory Visit: Payer: Self-pay | Admitting: Cardiology

## 2022-11-25 NOTE — Telephone Encounter (Signed)
        Patient  visited [facility] on 11/03/2022  for treatment.   Telephone encounter attempt :  2nd  A HIPAA compliant voice message was left requesting a return call.  Instructed patient to call back at [phone].   [place] Resource Care Guide   ??[email or web address]  ?? [phone]   Website: [email or web address]  [email or web address]

## 2022-11-25 NOTE — Telephone Encounter (Signed)
Rx refill sent to pharmacy. 

## 2022-11-26 ENCOUNTER — Telehealth: Payer: Self-pay

## 2022-11-26 DIAGNOSIS — Z794 Long term (current) use of insulin: Secondary | ICD-10-CM | POA: Diagnosis not present

## 2022-11-26 DIAGNOSIS — E1165 Type 2 diabetes mellitus with hyperglycemia: Secondary | ICD-10-CM | POA: Diagnosis not present

## 2022-11-26 NOTE — Telephone Encounter (Signed)
        Patient  visited [facility] on 11/03/2022  for treatment.   Telephone encounter attempt :  3rd  A HIPAA compliant voice message was left requesting a return call.  Instructed patient to call back at [phone].   [place] Resource Care Guide   ??[email or web address]  ?? [phone]   Website: [email or web address]  [email or web address]

## 2022-12-07 DIAGNOSIS — E871 Hypo-osmolality and hyponatremia: Secondary | ICD-10-CM | POA: Diagnosis not present

## 2022-12-07 DIAGNOSIS — R739 Hyperglycemia, unspecified: Secondary | ICD-10-CM | POA: Diagnosis not present

## 2022-12-07 DIAGNOSIS — E860 Dehydration: Secondary | ICD-10-CM | POA: Diagnosis not present

## 2022-12-07 DIAGNOSIS — M1990 Unspecified osteoarthritis, unspecified site: Secondary | ICD-10-CM | POA: Diagnosis not present

## 2022-12-07 DIAGNOSIS — I700 Atherosclerosis of aorta: Secondary | ICD-10-CM | POA: Diagnosis not present

## 2022-12-07 DIAGNOSIS — E1122 Type 2 diabetes mellitus with diabetic chronic kidney disease: Secondary | ICD-10-CM | POA: Diagnosis not present

## 2022-12-07 DIAGNOSIS — I509 Heart failure, unspecified: Secondary | ICD-10-CM | POA: Diagnosis not present

## 2022-12-07 DIAGNOSIS — Z951 Presence of aortocoronary bypass graft: Secondary | ICD-10-CM | POA: Diagnosis not present

## 2022-12-07 DIAGNOSIS — M545 Low back pain, unspecified: Secondary | ICD-10-CM | POA: Diagnosis not present

## 2022-12-07 DIAGNOSIS — J449 Chronic obstructive pulmonary disease, unspecified: Secondary | ICD-10-CM | POA: Diagnosis not present

## 2022-12-07 DIAGNOSIS — R109 Unspecified abdominal pain: Secondary | ICD-10-CM | POA: Diagnosis not present

## 2022-12-07 DIAGNOSIS — R079 Chest pain, unspecified: Secondary | ICD-10-CM | POA: Diagnosis not present

## 2022-12-07 DIAGNOSIS — M47816 Spondylosis without myelopathy or radiculopathy, lumbar region: Secondary | ICD-10-CM | POA: Diagnosis not present

## 2022-12-07 DIAGNOSIS — I4891 Unspecified atrial fibrillation: Secondary | ICD-10-CM | POA: Diagnosis not present

## 2022-12-07 DIAGNOSIS — E039 Hypothyroidism, unspecified: Secondary | ICD-10-CM | POA: Diagnosis not present

## 2022-12-07 DIAGNOSIS — J439 Emphysema, unspecified: Secondary | ICD-10-CM | POA: Diagnosis not present

## 2022-12-07 DIAGNOSIS — M549 Dorsalgia, unspecified: Secondary | ICD-10-CM | POA: Diagnosis not present

## 2022-12-07 DIAGNOSIS — E875 Hyperkalemia: Secondary | ICD-10-CM | POA: Diagnosis not present

## 2022-12-07 DIAGNOSIS — G8929 Other chronic pain: Secondary | ICD-10-CM | POA: Diagnosis not present

## 2022-12-07 DIAGNOSIS — K219 Gastro-esophageal reflux disease without esophagitis: Secondary | ICD-10-CM | POA: Diagnosis not present

## 2022-12-07 DIAGNOSIS — R9431 Abnormal electrocardiogram [ECG] [EKG]: Secondary | ICD-10-CM | POA: Diagnosis not present

## 2022-12-07 DIAGNOSIS — E872 Acidosis, unspecified: Secondary | ICD-10-CM | POA: Diagnosis not present

## 2022-12-07 DIAGNOSIS — Z743 Need for continuous supervision: Secondary | ICD-10-CM | POA: Diagnosis not present

## 2022-12-07 DIAGNOSIS — D649 Anemia, unspecified: Secondary | ICD-10-CM | POA: Diagnosis not present

## 2022-12-07 DIAGNOSIS — N189 Chronic kidney disease, unspecified: Secondary | ICD-10-CM | POA: Diagnosis not present

## 2022-12-07 DIAGNOSIS — M48061 Spinal stenosis, lumbar region without neurogenic claudication: Secondary | ICD-10-CM | POA: Diagnosis not present

## 2022-12-07 DIAGNOSIS — I130 Hypertensive heart and chronic kidney disease with heart failure and stage 1 through stage 4 chronic kidney disease, or unspecified chronic kidney disease: Secondary | ICD-10-CM | POA: Diagnosis not present

## 2022-12-07 DIAGNOSIS — E1159 Type 2 diabetes mellitus with other circulatory complications: Secondary | ICD-10-CM | POA: Diagnosis not present

## 2022-12-07 DIAGNOSIS — E1165 Type 2 diabetes mellitus with hyperglycemia: Secondary | ICD-10-CM | POA: Diagnosis not present

## 2022-12-07 DIAGNOSIS — I2510 Atherosclerotic heart disease of native coronary artery without angina pectoris: Secondary | ICD-10-CM | POA: Diagnosis not present

## 2022-12-07 DIAGNOSIS — N179 Acute kidney failure, unspecified: Secondary | ICD-10-CM | POA: Diagnosis not present

## 2022-12-07 DIAGNOSIS — I252 Old myocardial infarction: Secondary | ICD-10-CM | POA: Diagnosis not present

## 2022-12-07 DIAGNOSIS — N183 Chronic kidney disease, stage 3 unspecified: Secondary | ICD-10-CM | POA: Diagnosis not present

## 2022-12-07 DIAGNOSIS — F419 Anxiety disorder, unspecified: Secondary | ICD-10-CM | POA: Diagnosis not present

## 2022-12-07 DIAGNOSIS — Z87891 Personal history of nicotine dependence: Secondary | ICD-10-CM | POA: Diagnosis not present

## 2022-12-07 DIAGNOSIS — E7800 Pure hypercholesterolemia, unspecified: Secondary | ICD-10-CM | POA: Diagnosis not present

## 2022-12-08 DIAGNOSIS — E1165 Type 2 diabetes mellitus with hyperglycemia: Secondary | ICD-10-CM | POA: Diagnosis not present

## 2022-12-08 DIAGNOSIS — I130 Hypertensive heart and chronic kidney disease with heart failure and stage 1 through stage 4 chronic kidney disease, or unspecified chronic kidney disease: Secondary | ICD-10-CM | POA: Diagnosis not present

## 2022-12-08 DIAGNOSIS — E871 Hypo-osmolality and hyponatremia: Secondary | ICD-10-CM | POA: Diagnosis not present

## 2022-12-09 DIAGNOSIS — E1165 Type 2 diabetes mellitus with hyperglycemia: Secondary | ICD-10-CM | POA: Diagnosis not present

## 2022-12-09 DIAGNOSIS — I130 Hypertensive heart and chronic kidney disease with heart failure and stage 1 through stage 4 chronic kidney disease, or unspecified chronic kidney disease: Secondary | ICD-10-CM | POA: Diagnosis not present

## 2022-12-09 DIAGNOSIS — E871 Hypo-osmolality and hyponatremia: Secondary | ICD-10-CM | POA: Diagnosis not present

## 2022-12-29 DIAGNOSIS — I509 Heart failure, unspecified: Secondary | ICD-10-CM | POA: Diagnosis not present

## 2022-12-29 DIAGNOSIS — E785 Hyperlipidemia, unspecified: Secondary | ICD-10-CM | POA: Diagnosis not present

## 2022-12-29 DIAGNOSIS — I10 Essential (primary) hypertension: Secondary | ICD-10-CM | POA: Diagnosis not present

## 2023-01-05 DIAGNOSIS — N184 Chronic kidney disease, stage 4 (severe): Secondary | ICD-10-CM | POA: Diagnosis not present

## 2023-01-05 DIAGNOSIS — R339 Retention of urine, unspecified: Secondary | ICD-10-CM | POA: Diagnosis not present

## 2023-01-05 DIAGNOSIS — N319 Neuromuscular dysfunction of bladder, unspecified: Secondary | ICD-10-CM | POA: Diagnosis not present

## 2023-01-05 DIAGNOSIS — N390 Urinary tract infection, site not specified: Secondary | ICD-10-CM | POA: Diagnosis not present

## 2023-01-05 DIAGNOSIS — B3731 Acute candidiasis of vulva and vagina: Secondary | ICD-10-CM | POA: Diagnosis not present

## 2023-01-05 DIAGNOSIS — N3946 Mixed incontinence: Secondary | ICD-10-CM | POA: Diagnosis not present

## 2023-01-07 DIAGNOSIS — E1159 Type 2 diabetes mellitus with other circulatory complications: Secondary | ICD-10-CM | POA: Diagnosis not present

## 2023-01-10 DIAGNOSIS — N390 Urinary tract infection, site not specified: Secondary | ICD-10-CM | POA: Diagnosis not present

## 2023-01-10 DIAGNOSIS — R9431 Abnormal electrocardiogram [ECG] [EKG]: Secondary | ICD-10-CM | POA: Diagnosis not present

## 2023-01-10 DIAGNOSIS — Z79899 Other long term (current) drug therapy: Secondary | ICD-10-CM | POA: Diagnosis not present

## 2023-01-10 DIAGNOSIS — I2510 Atherosclerotic heart disease of native coronary artery without angina pectoris: Secondary | ICD-10-CM | POA: Diagnosis not present

## 2023-01-10 DIAGNOSIS — E7800 Pure hypercholesterolemia, unspecified: Secondary | ICD-10-CM | POA: Diagnosis not present

## 2023-01-10 DIAGNOSIS — R42 Dizziness and giddiness: Secondary | ICD-10-CM | POA: Diagnosis not present

## 2023-01-10 DIAGNOSIS — I10 Essential (primary) hypertension: Secondary | ICD-10-CM | POA: Diagnosis not present

## 2023-01-10 DIAGNOSIS — M1990 Unspecified osteoarthritis, unspecified site: Secondary | ICD-10-CM | POA: Diagnosis not present

## 2023-01-10 DIAGNOSIS — Z743 Need for continuous supervision: Secondary | ICD-10-CM | POA: Diagnosis not present

## 2023-01-10 DIAGNOSIS — I252 Old myocardial infarction: Secondary | ICD-10-CM | POA: Diagnosis not present

## 2023-01-10 DIAGNOSIS — I130 Hypertensive heart and chronic kidney disease with heart failure and stage 1 through stage 4 chronic kidney disease, or unspecified chronic kidney disease: Secondary | ICD-10-CM | POA: Diagnosis not present

## 2023-01-10 DIAGNOSIS — I639 Cerebral infarction, unspecified: Secondary | ICD-10-CM | POA: Diagnosis not present

## 2023-01-10 DIAGNOSIS — N179 Acute kidney failure, unspecified: Secondary | ICD-10-CM | POA: Diagnosis not present

## 2023-01-10 DIAGNOSIS — R0902 Hypoxemia: Secondary | ICD-10-CM | POA: Diagnosis not present

## 2023-01-10 DIAGNOSIS — Z8673 Personal history of transient ischemic attack (TIA), and cerebral infarction without residual deficits: Secondary | ICD-10-CM | POA: Diagnosis not present

## 2023-01-10 DIAGNOSIS — I482 Chronic atrial fibrillation, unspecified: Secondary | ICD-10-CM | POA: Diagnosis not present

## 2023-01-10 DIAGNOSIS — Z794 Long term (current) use of insulin: Secondary | ICD-10-CM | POA: Diagnosis not present

## 2023-01-10 DIAGNOSIS — Z888 Allergy status to other drugs, medicaments and biological substances status: Secondary | ICD-10-CM | POA: Diagnosis not present

## 2023-01-10 DIAGNOSIS — D631 Anemia in chronic kidney disease: Secondary | ICD-10-CM | POA: Diagnosis not present

## 2023-01-10 DIAGNOSIS — Z87891 Personal history of nicotine dependence: Secondary | ICD-10-CM | POA: Diagnosis not present

## 2023-01-10 DIAGNOSIS — Z792 Long term (current) use of antibiotics: Secondary | ICD-10-CM | POA: Diagnosis not present

## 2023-01-10 DIAGNOSIS — E782 Mixed hyperlipidemia: Secondary | ICD-10-CM | POA: Diagnosis not present

## 2023-01-10 DIAGNOSIS — E1122 Type 2 diabetes mellitus with diabetic chronic kidney disease: Secondary | ICD-10-CM | POA: Diagnosis not present

## 2023-01-10 DIAGNOSIS — N189 Chronic kidney disease, unspecified: Secondary | ICD-10-CM | POA: Diagnosis not present

## 2023-01-10 DIAGNOSIS — J45909 Unspecified asthma, uncomplicated: Secondary | ICD-10-CM | POA: Diagnosis not present

## 2023-01-10 DIAGNOSIS — E860 Dehydration: Secondary | ICD-10-CM | POA: Diagnosis not present

## 2023-01-10 DIAGNOSIS — N1832 Chronic kidney disease, stage 3b: Secondary | ICD-10-CM | POA: Diagnosis not present

## 2023-01-10 DIAGNOSIS — R531 Weakness: Secondary | ICD-10-CM | POA: Diagnosis not present

## 2023-01-10 DIAGNOSIS — E039 Hypothyroidism, unspecified: Secondary | ICD-10-CM | POA: Diagnosis not present

## 2023-01-10 DIAGNOSIS — Z87440 Personal history of urinary (tract) infections: Secondary | ICD-10-CM | POA: Diagnosis not present

## 2023-01-11 DIAGNOSIS — I639 Cerebral infarction, unspecified: Secondary | ICD-10-CM | POA: Diagnosis not present

## 2023-01-11 DIAGNOSIS — I482 Chronic atrial fibrillation, unspecified: Secondary | ICD-10-CM | POA: Diagnosis not present

## 2023-01-11 DIAGNOSIS — E860 Dehydration: Secondary | ICD-10-CM | POA: Diagnosis not present

## 2023-01-11 DIAGNOSIS — R42 Dizziness and giddiness: Secondary | ICD-10-CM | POA: Diagnosis not present

## 2023-01-11 DIAGNOSIS — R9431 Abnormal electrocardiogram [ECG] [EKG]: Secondary | ICD-10-CM | POA: Diagnosis not present

## 2023-01-11 DIAGNOSIS — R531 Weakness: Secondary | ICD-10-CM | POA: Diagnosis not present

## 2023-01-11 DIAGNOSIS — I130 Hypertensive heart and chronic kidney disease with heart failure and stage 1 through stage 4 chronic kidney disease, or unspecified chronic kidney disease: Secondary | ICD-10-CM | POA: Diagnosis not present

## 2023-01-11 DIAGNOSIS — N390 Urinary tract infection, site not specified: Secondary | ICD-10-CM | POA: Diagnosis not present

## 2023-01-11 DIAGNOSIS — G9389 Other specified disorders of brain: Secondary | ICD-10-CM | POA: Diagnosis not present

## 2023-01-12 DIAGNOSIS — N390 Urinary tract infection, site not specified: Secondary | ICD-10-CM | POA: Diagnosis not present

## 2023-01-12 DIAGNOSIS — I482 Chronic atrial fibrillation, unspecified: Secondary | ICD-10-CM | POA: Diagnosis not present

## 2023-01-12 DIAGNOSIS — I130 Hypertensive heart and chronic kidney disease with heart failure and stage 1 through stage 4 chronic kidney disease, or unspecified chronic kidney disease: Secondary | ICD-10-CM | POA: Diagnosis not present

## 2023-01-13 DIAGNOSIS — E872 Acidosis, unspecified: Secondary | ICD-10-CM | POA: Diagnosis not present

## 2023-01-13 DIAGNOSIS — I639 Cerebral infarction, unspecified: Secondary | ICD-10-CM | POA: Diagnosis not present

## 2023-01-13 DIAGNOSIS — M810 Age-related osteoporosis without current pathological fracture: Secondary | ICD-10-CM | POA: Diagnosis not present

## 2023-01-13 DIAGNOSIS — N390 Urinary tract infection, site not specified: Secondary | ICD-10-CM | POA: Diagnosis not present

## 2023-01-13 DIAGNOSIS — N3281 Overactive bladder: Secondary | ICD-10-CM | POA: Diagnosis not present

## 2023-01-13 DIAGNOSIS — E1165 Type 2 diabetes mellitus with hyperglycemia: Secondary | ICD-10-CM | POA: Diagnosis not present

## 2023-01-13 DIAGNOSIS — R531 Weakness: Secondary | ICD-10-CM | POA: Diagnosis not present

## 2023-01-13 DIAGNOSIS — M1990 Unspecified osteoarthritis, unspecified site: Secondary | ICD-10-CM | POA: Diagnosis not present

## 2023-01-13 DIAGNOSIS — E039 Hypothyroidism, unspecified: Secondary | ICD-10-CM | POA: Diagnosis not present

## 2023-01-13 DIAGNOSIS — E559 Vitamin D deficiency, unspecified: Secondary | ICD-10-CM | POA: Diagnosis not present

## 2023-01-13 DIAGNOSIS — E538 Deficiency of other specified B group vitamins: Secondary | ICD-10-CM | POA: Diagnosis not present

## 2023-01-13 DIAGNOSIS — E063 Autoimmune thyroiditis: Secondary | ICD-10-CM | POA: Diagnosis not present

## 2023-01-13 DIAGNOSIS — N179 Acute kidney failure, unspecified: Secondary | ICD-10-CM | POA: Diagnosis not present

## 2023-01-13 DIAGNOSIS — E860 Dehydration: Secondary | ICD-10-CM | POA: Diagnosis not present

## 2023-01-13 DIAGNOSIS — D631 Anemia in chronic kidney disease: Secondary | ICD-10-CM | POA: Diagnosis not present

## 2023-01-13 DIAGNOSIS — G319 Degenerative disease of nervous system, unspecified: Secondary | ICD-10-CM | POA: Diagnosis not present

## 2023-01-13 DIAGNOSIS — K2100 Gastro-esophageal reflux disease with esophagitis, without bleeding: Secondary | ICD-10-CM | POA: Diagnosis not present

## 2023-01-13 DIAGNOSIS — E875 Hyperkalemia: Secondary | ICD-10-CM | POA: Diagnosis not present

## 2023-01-13 DIAGNOSIS — J4489 Other specified chronic obstructive pulmonary disease: Secondary | ICD-10-CM | POA: Diagnosis not present

## 2023-01-13 DIAGNOSIS — R9431 Abnormal electrocardiogram [ECG] [EKG]: Secondary | ICD-10-CM | POA: Diagnosis not present

## 2023-01-13 DIAGNOSIS — I482 Chronic atrial fibrillation, unspecified: Secondary | ICD-10-CM | POA: Diagnosis not present

## 2023-01-13 DIAGNOSIS — E1122 Type 2 diabetes mellitus with diabetic chronic kidney disease: Secondary | ICD-10-CM | POA: Diagnosis not present

## 2023-01-13 DIAGNOSIS — I2510 Atherosclerotic heart disease of native coronary artery without angina pectoris: Secondary | ICD-10-CM | POA: Diagnosis not present

## 2023-01-13 DIAGNOSIS — I69320 Aphasia following cerebral infarction: Secondary | ICD-10-CM | POA: Diagnosis not present

## 2023-01-13 DIAGNOSIS — I129 Hypertensive chronic kidney disease with stage 1 through stage 4 chronic kidney disease, or unspecified chronic kidney disease: Secondary | ICD-10-CM | POA: Diagnosis not present

## 2023-01-13 DIAGNOSIS — G8929 Other chronic pain: Secondary | ICD-10-CM | POA: Diagnosis not present

## 2023-01-13 DIAGNOSIS — N1832 Chronic kidney disease, stage 3b: Secondary | ICD-10-CM | POA: Diagnosis not present

## 2023-01-13 DIAGNOSIS — E782 Mixed hyperlipidemia: Secondary | ICD-10-CM | POA: Diagnosis not present

## 2023-02-05 DIAGNOSIS — E1159 Type 2 diabetes mellitus with other circulatory complications: Secondary | ICD-10-CM | POA: Diagnosis not present

## 2023-03-08 DIAGNOSIS — E1159 Type 2 diabetes mellitus with other circulatory complications: Secondary | ICD-10-CM | POA: Diagnosis not present

## 2023-03-08 DIAGNOSIS — M545 Low back pain, unspecified: Secondary | ICD-10-CM | POA: Diagnosis not present

## 2023-03-08 DIAGNOSIS — R42 Dizziness and giddiness: Secondary | ICD-10-CM | POA: Diagnosis not present

## 2023-03-08 DIAGNOSIS — E119 Type 2 diabetes mellitus without complications: Secondary | ICD-10-CM | POA: Diagnosis not present

## 2023-03-08 DIAGNOSIS — N390 Urinary tract infection, site not specified: Secondary | ICD-10-CM | POA: Diagnosis not present

## 2023-03-08 DIAGNOSIS — Z741 Need for assistance with personal care: Secondary | ICD-10-CM | POA: Diagnosis not present

## 2023-03-08 DIAGNOSIS — I2510 Atherosclerotic heart disease of native coronary artery without angina pectoris: Secondary | ICD-10-CM | POA: Diagnosis not present

## 2023-03-08 DIAGNOSIS — M79671 Pain in right foot: Secondary | ICD-10-CM | POA: Diagnosis not present

## 2023-03-21 DIAGNOSIS — R339 Retention of urine, unspecified: Secondary | ICD-10-CM | POA: Diagnosis not present

## 2023-03-21 DIAGNOSIS — N3946 Mixed incontinence: Secondary | ICD-10-CM | POA: Diagnosis not present

## 2023-03-21 DIAGNOSIS — N390 Urinary tract infection, site not specified: Secondary | ICD-10-CM | POA: Diagnosis not present

## 2023-03-21 DIAGNOSIS — N184 Chronic kidney disease, stage 4 (severe): Secondary | ICD-10-CM | POA: Diagnosis not present

## 2023-04-01 ENCOUNTER — Ambulatory Visit (INDEPENDENT_AMBULATORY_CARE_PROVIDER_SITE_OTHER): Payer: Medicare HMO | Admitting: Podiatry

## 2023-04-01 DIAGNOSIS — Z91199 Patient's noncompliance with other medical treatment and regimen due to unspecified reason: Secondary | ICD-10-CM

## 2023-04-01 NOTE — Progress Notes (Signed)
Pt was no show for apt, charge generated 

## 2023-04-05 ENCOUNTER — Ambulatory Visit: Payer: Medicare HMO | Admitting: Podiatry

## 2023-04-07 DIAGNOSIS — M25552 Pain in left hip: Secondary | ICD-10-CM | POA: Diagnosis not present

## 2023-04-07 DIAGNOSIS — D509 Iron deficiency anemia, unspecified: Secondary | ICD-10-CM | POA: Diagnosis not present

## 2023-04-07 DIAGNOSIS — Z7689 Persons encountering health services in other specified circumstances: Secondary | ICD-10-CM | POA: Diagnosis not present

## 2023-04-07 DIAGNOSIS — E1159 Type 2 diabetes mellitus with other circulatory complications: Secondary | ICD-10-CM | POA: Diagnosis not present

## 2023-04-07 DIAGNOSIS — E538 Deficiency of other specified B group vitamins: Secondary | ICD-10-CM | POA: Diagnosis not present

## 2023-04-07 DIAGNOSIS — R32 Unspecified urinary incontinence: Secondary | ICD-10-CM | POA: Diagnosis not present

## 2023-04-07 DIAGNOSIS — R42 Dizziness and giddiness: Secondary | ICD-10-CM | POA: Diagnosis not present

## 2023-04-07 DIAGNOSIS — R195 Other fecal abnormalities: Secondary | ICD-10-CM | POA: Diagnosis not present

## 2023-04-07 DIAGNOSIS — E119 Type 2 diabetes mellitus without complications: Secondary | ICD-10-CM | POA: Diagnosis not present

## 2023-04-07 DIAGNOSIS — N289 Disorder of kidney and ureter, unspecified: Secondary | ICD-10-CM | POA: Diagnosis not present

## 2023-04-07 DIAGNOSIS — M545 Low back pain, unspecified: Secondary | ICD-10-CM | POA: Diagnosis not present

## 2023-04-08 ENCOUNTER — Ambulatory Visit: Payer: Medicare HMO | Admitting: Podiatry

## 2023-04-13 IMAGING — CT CT CERVICAL SPINE W/O CM
3 series · 15 of 33 positions shown, 18 images · non-contrast
Comparison: April 21, 2020

CLINICAL DATA: Head trauma, minor (Age >= 65y); Neck trauma (Age >=
65y)

EXAM:
CT HEAD WITHOUT CONTRAST
CT CERVICAL SPINE WITHOUT CONTRAST
TECHNIQUE: Multidetector CT imaging of the head and cervical spine was
performed following the standard protocol without intravenous
contrast. Multiplanar CT image reconstructions of the cervical spine
were also generated.

[Series 5: c spine soft · axial · 0.37mm/px · z∈[-202,-50]mm · 7 of 90 slices shown, 9 images]
[im 7/90  soft-tissue]
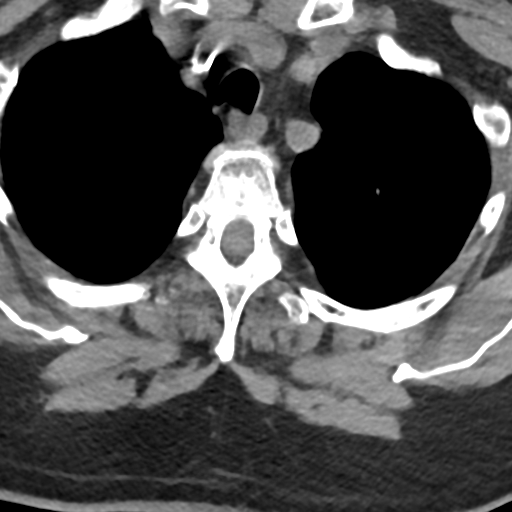
[im 7/90  bone]
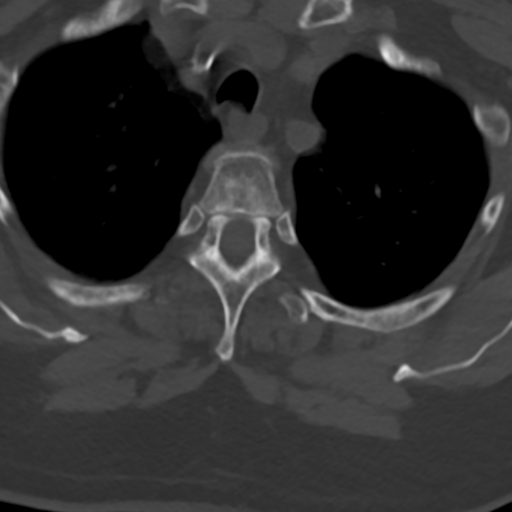
[im 21/90  bone]
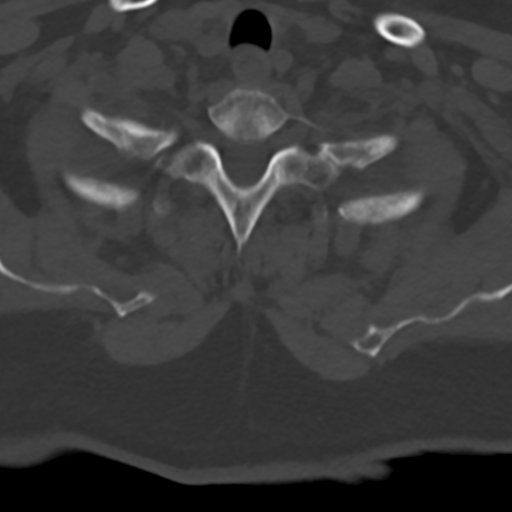
[im 35/90  bone]
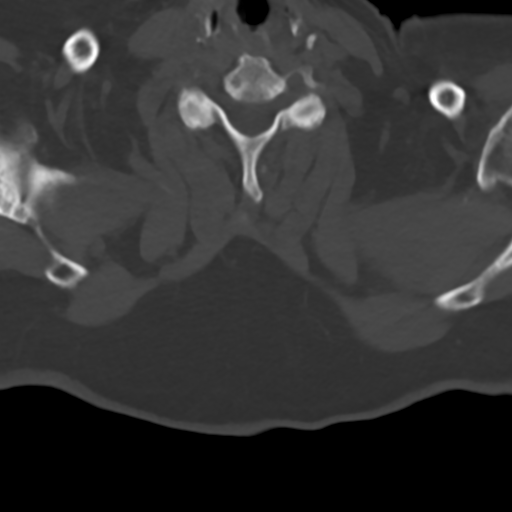
[im 48/90  bone]
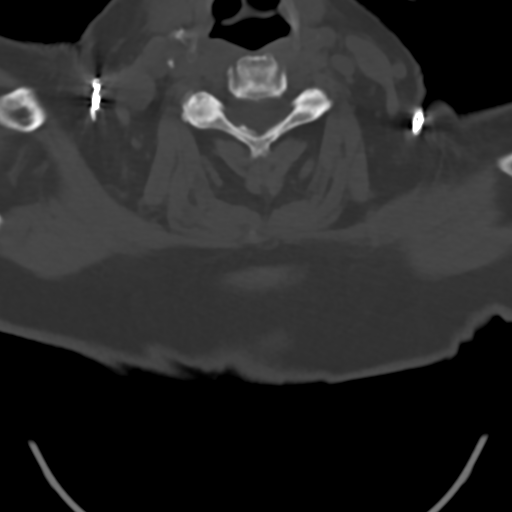
[im 55/90  soft-tissue]
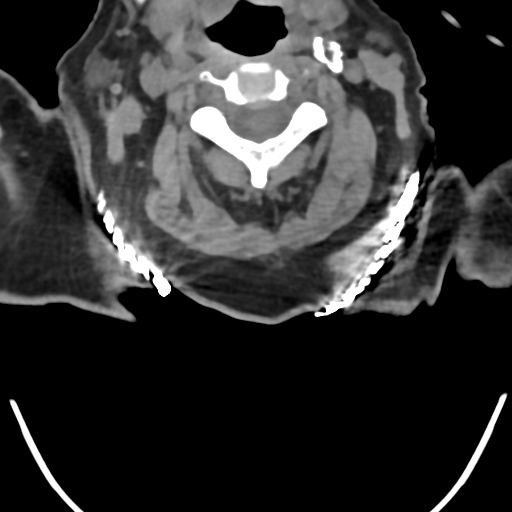
[im 55/90  bone]
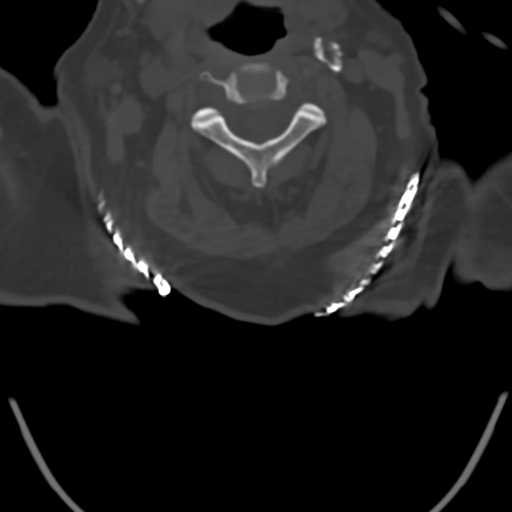
[im 69/90  bone]
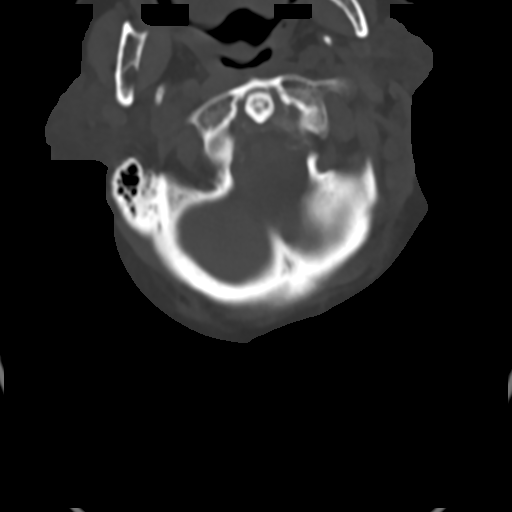
[im 83/90  bone]
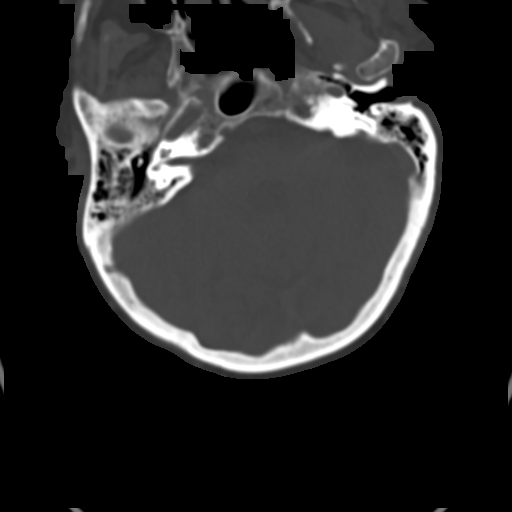

[Series 8: sag bone · sagittal · 0.35mm/px · 5 of 77 slices shown, 6 images]
[im 26/77  bone]
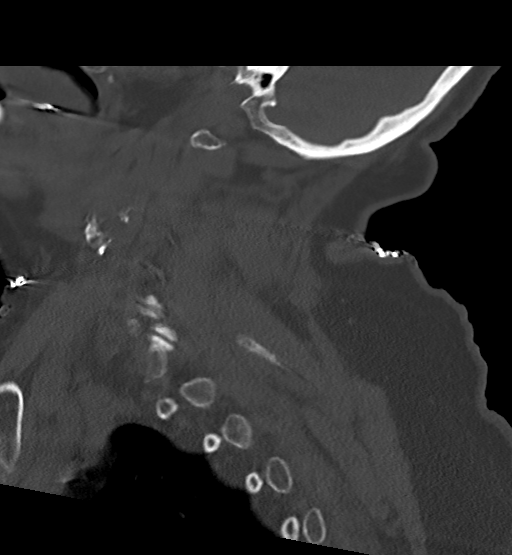
[im 32/77  bone]
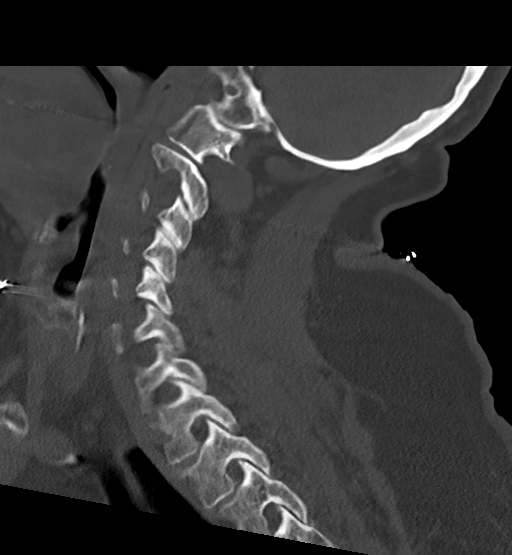
[im 39/77  soft-tissue]
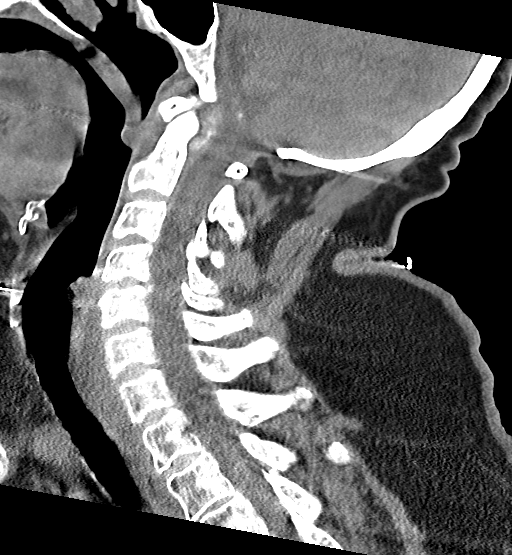
[im 39/77  bone]
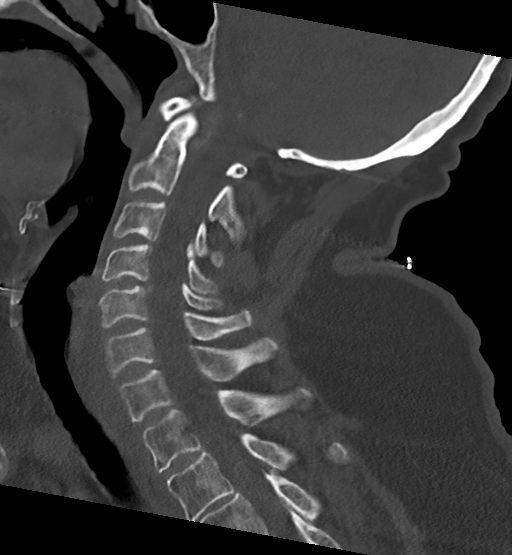
[im 45/77  bone]
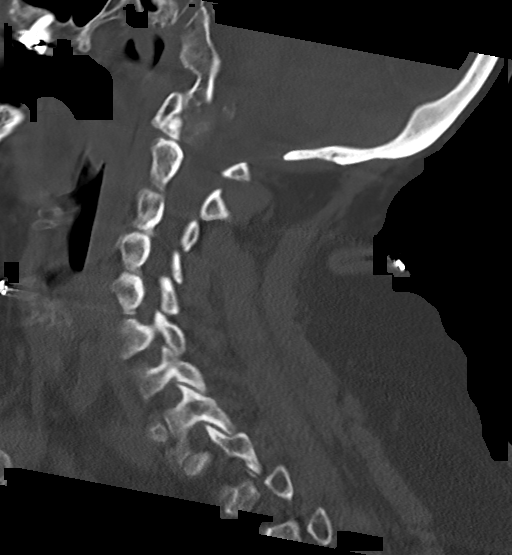
[im 51/77  bone]
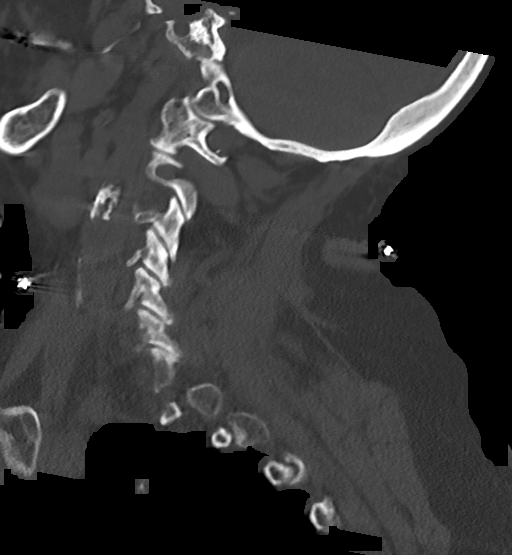

[Series 9: cor bone · coronal · 0.38mm/px · 3 of 91 slices shown]
[im 19/91  bone]
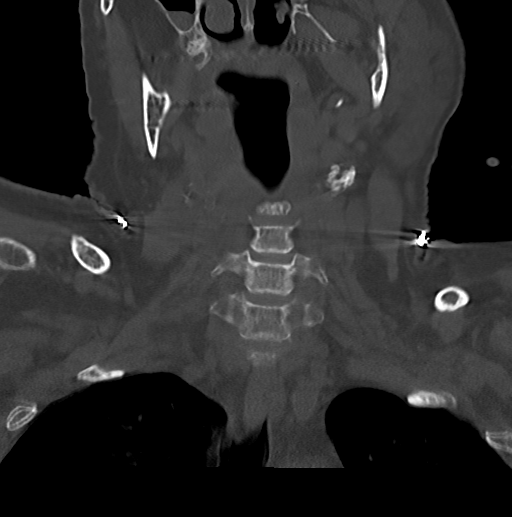
[im 37/91  bone]
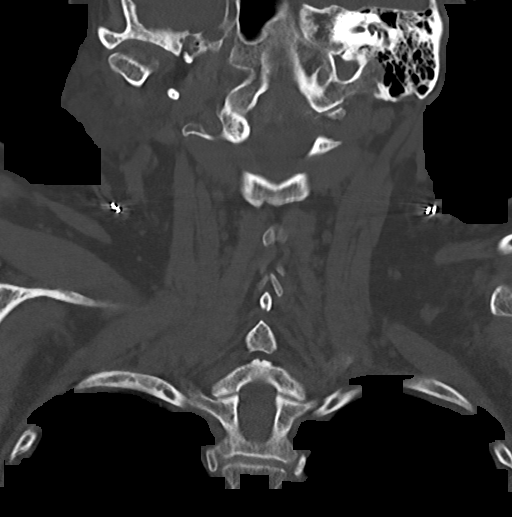
[im 55/91  bone]
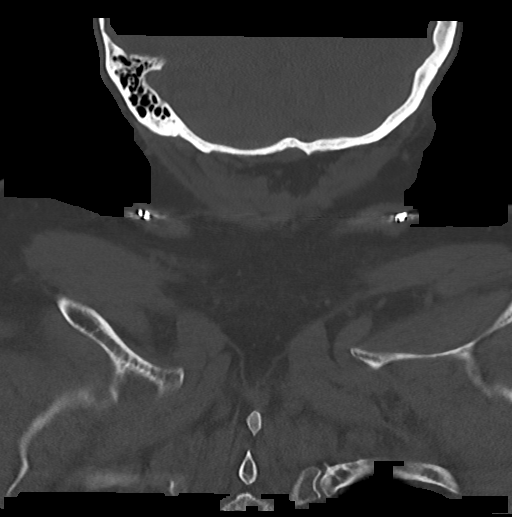

[15 of 33 positions shown; findings below may reference images not displayed]

FINDINGS: CT HEAD FINDINGS

Brain: No evidence of acute infarction, hemorrhage, hydrocephalus,
extra-axial collection or mass lesion/mass effect. Periventricular
white matter hypodensities consistent with sequela of chronic
microvascular ischemic disease. Remote RIGHT basal ganglia lacunar
infarction. Encephalomalacia of the RIGHT occipital lobe consistent
with remote prior infarction; degree of encephalomalacia is greater
than that seen on diffusion weighted imaging from April 21, 2020.
Advanced global parenchymal volume loss for age.

Vascular: Vascular calcifications.

Skull: Normal. Negative for fracture or focal lesion.

Sinuses/Orbits: No acute finding.

Other: None.

CT CERVICAL SPINE FINDINGS

Alignment: Normal.

Skull base and vertebrae: No acute fracture. No primary bone lesion
or focal pathologic process.

Soft tissues and spinal canal: No prevertebral fluid or swelling. No
visible canal hematoma.

Disc levels: Mild intervertebral disc space height loss with
posterior disc osteophyte complex at C4-5. Multilevel facet
arthropathy without high-grade neural foraminal narrowing.

Upper chest: Negative.

Other: Atherosclerotic calcifications of the carotid bulbs.
IMPRESSION: 1. No acute intracranial abnormality. There is a remote RIGHT
occipital infarction with corresponding encephalomalacia, increased
2.  No acute fracture or static subluxation of the cervical spine.

## 2023-04-13 IMAGING — DX DG PORTABLE PELVIS
1 series · 1 of 1 positions shown · non-contrast
Comparison: None.

CLINICAL DATA: Fall.  Pain.

EXAM:
PORTABLE PELVIS 1-2 VIEWS

[pelvis ap]
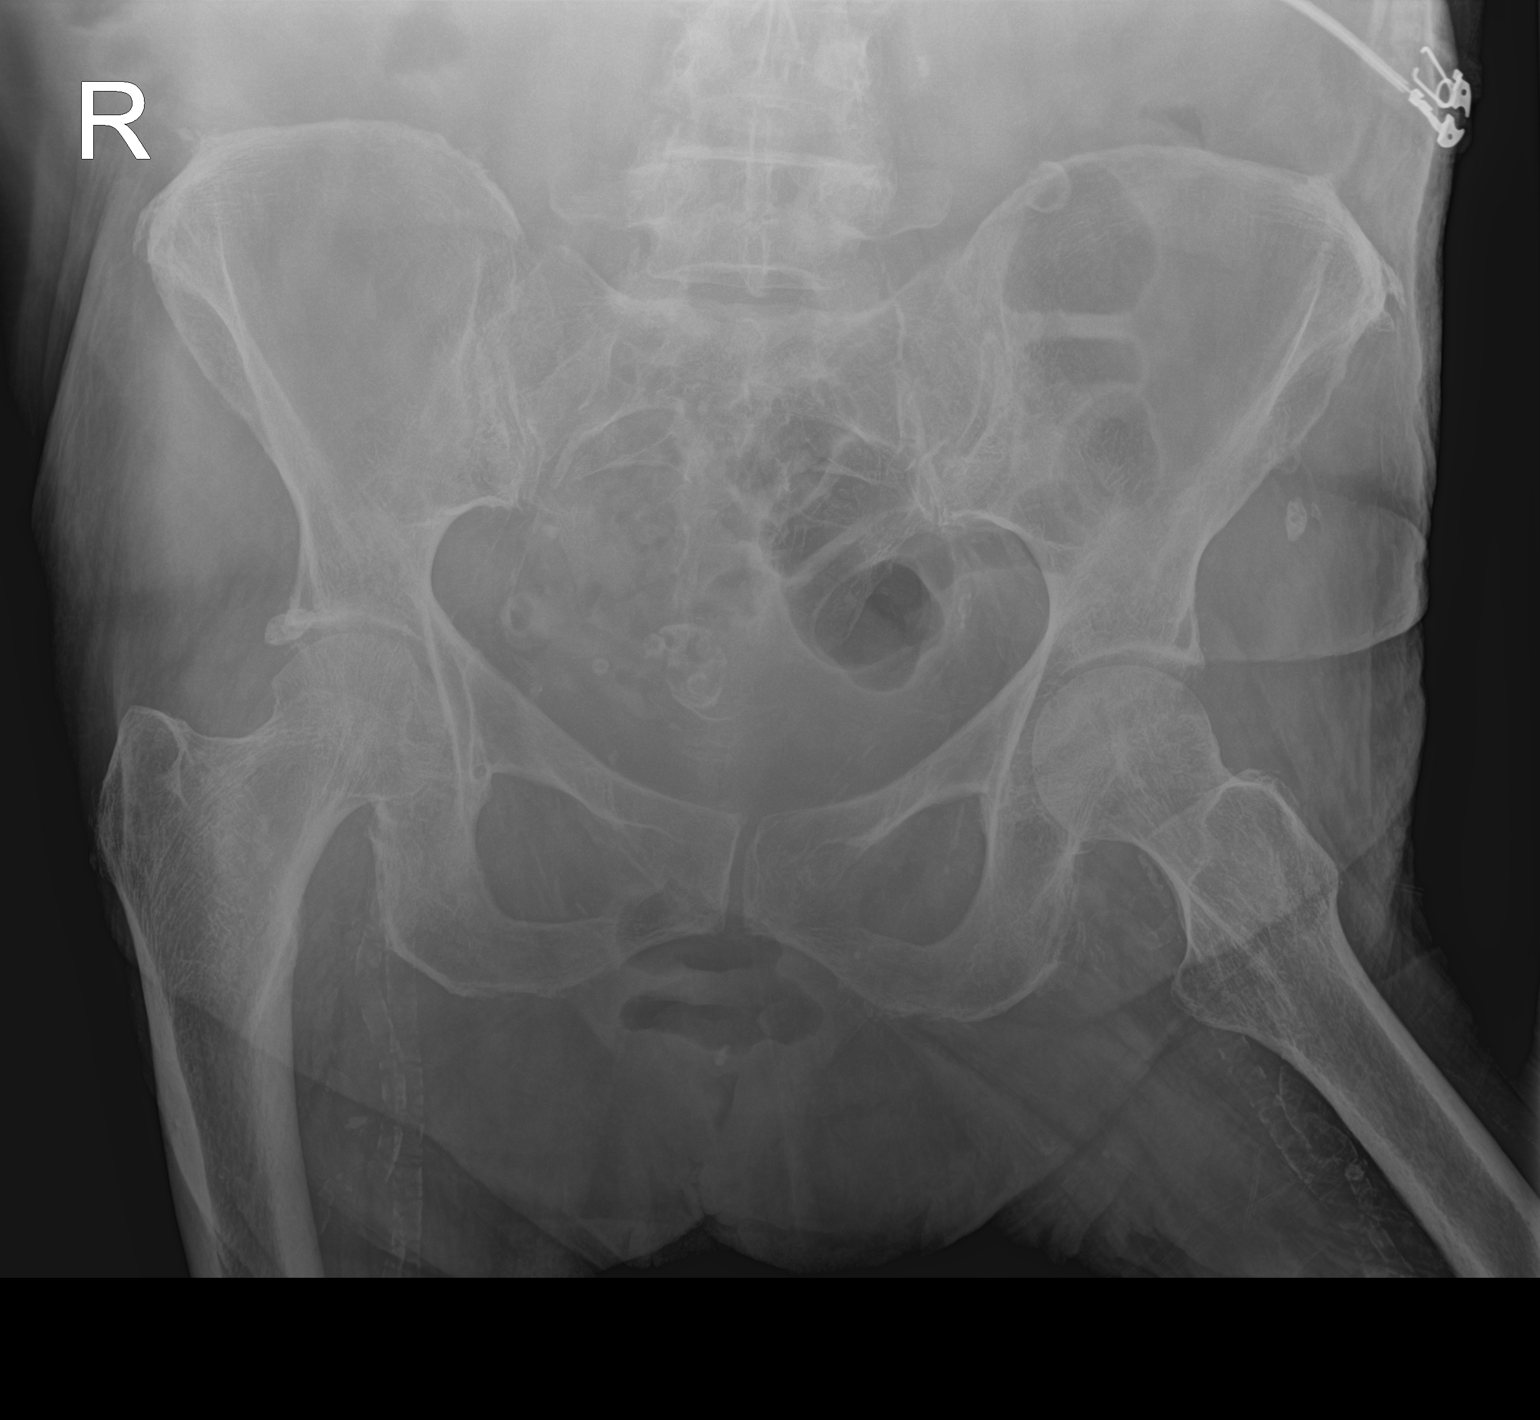

[1 of 1 positions shown; findings below may reference images not displayed]

FINDINGS: No fracture or bone lesion.

Hip joints, SI joints and symphysis pubis are normally aligned.

There are arterial vascular calcifications. Soft tissues are
otherwise unremarkable.
IMPRESSION: 1. No fracture, dislocation or acute finding.

## 2023-04-13 IMAGING — DX DG CHEST 1V PORT
1 series · 1 of 1 positions shown · non-contrast
Comparison: 03/14/2021

CLINICAL DATA: Fall.

EXAM:
PORTABLE CHEST 1 VIEW

[chest ap]
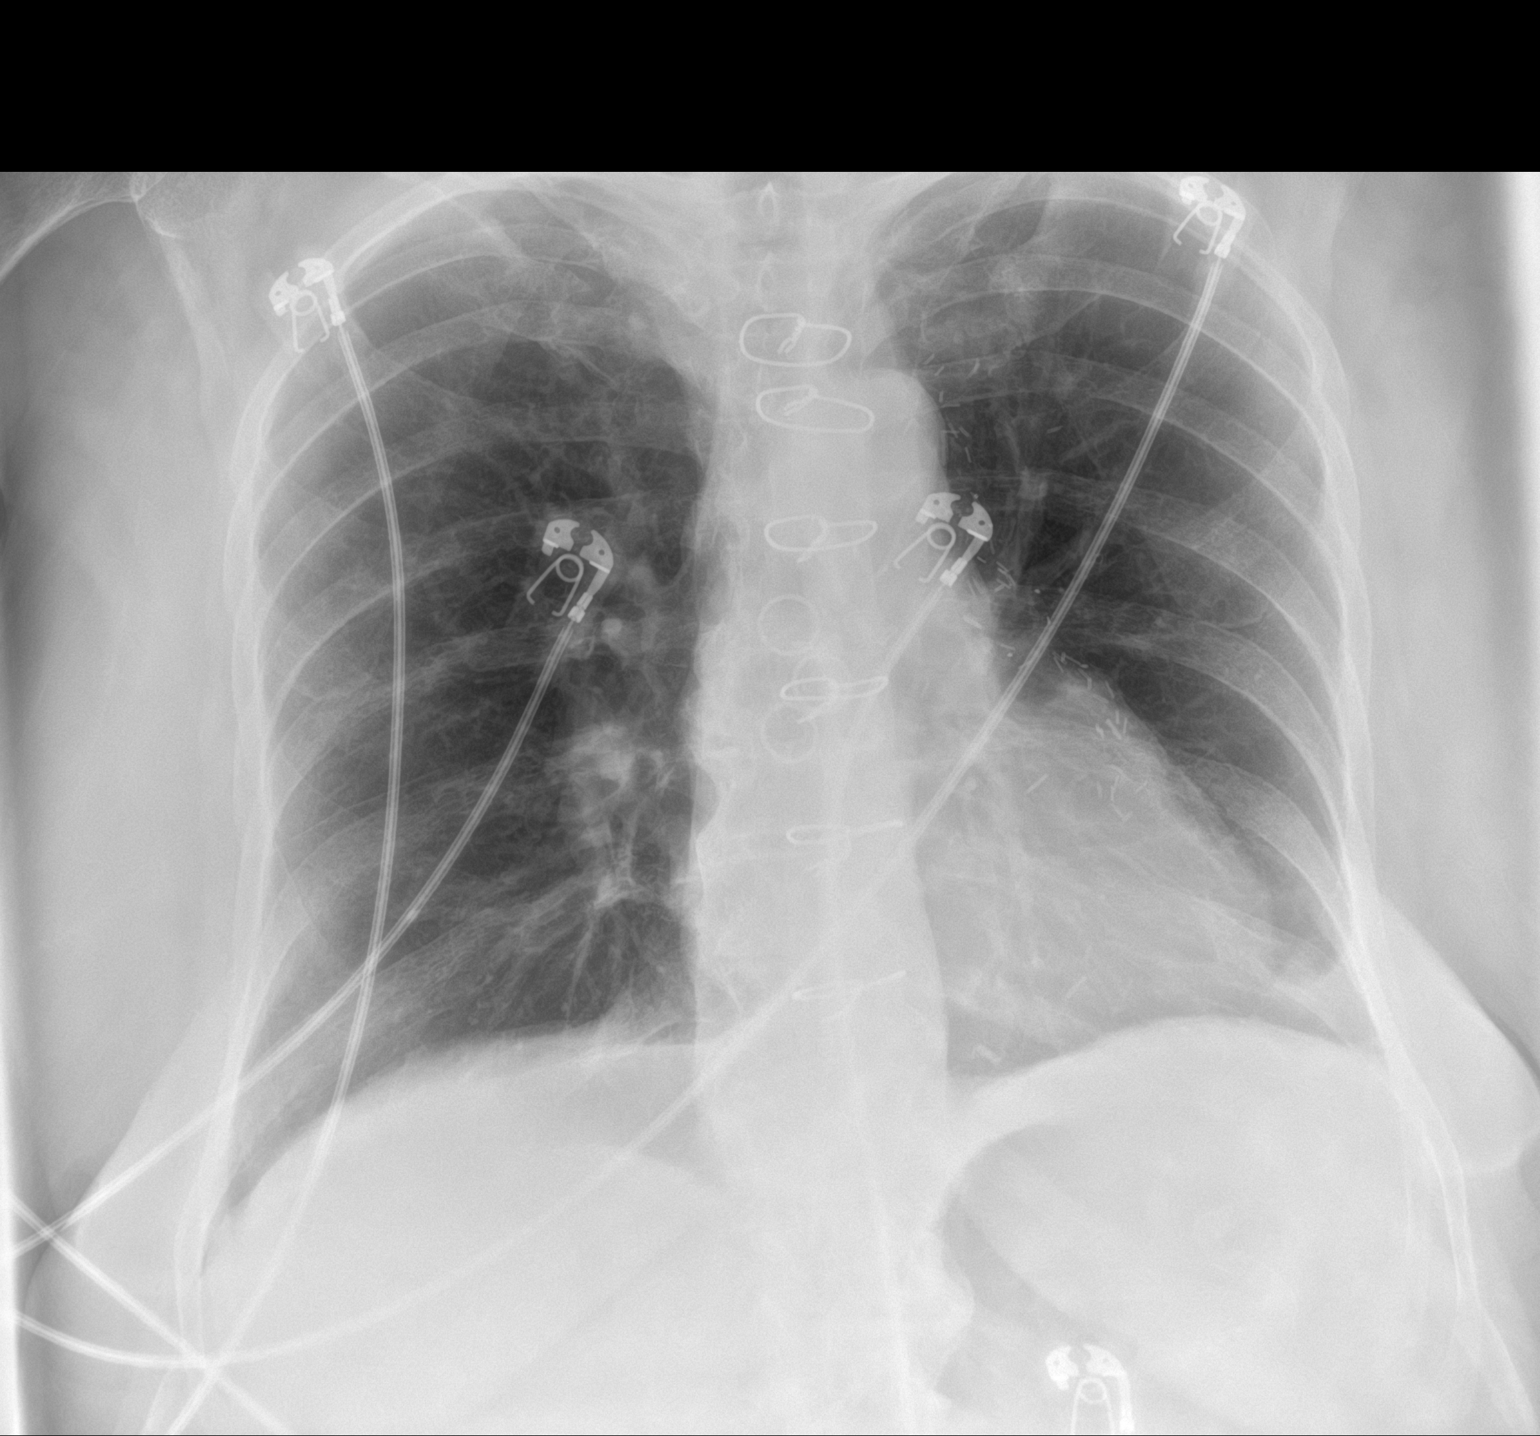

[1 of 1 positions shown; findings below may reference images not displayed]

FINDINGS: Stable changes from prior CABG surgery. Cardiac silhouette is normal
in size and configuration. No mediastinal or hilar masses.

Clear lungs.  No pleural effusion or pneumothorax.

Skeletal structures are grossly intact.
IMPRESSION: No active disease.

## 2023-04-20 ENCOUNTER — Ambulatory Visit: Payer: Medicare HMO | Admitting: Podiatry

## 2023-04-20 DIAGNOSIS — E1151 Type 2 diabetes mellitus with diabetic peripheral angiopathy without gangrene: Secondary | ICD-10-CM

## 2023-04-20 DIAGNOSIS — B351 Tinea unguium: Secondary | ICD-10-CM

## 2023-04-21 NOTE — Progress Notes (Signed)
       Subjective:  Patient ID: Garnette Scheuermann, female    DOB: 01/11/1946,  MRN: [id number]  Alanee I Sondgeroth presents to clinic today for:  Chief Complaint  Patient presents with   Nail Problem    Diabetic Foot Care-   Tobie Poet, MD Last Visit- May 2024   . Patient notes nails are thick and elongated, causing pain in shoe gear when ambulating.  Notes her nails are sensitive to trimming.  PCP is Gordy Councilman, FNP.  Allergies  Allergen Reactions   Atorvastatin     Other reaction(s): Other (See Comments) Muscle weakness and pain in legs    Review of Systems: Negative except as noted in the HPI.  Objective:  There were no vitals filed for this visit.  Yardley I Mikles is a pleasant 77 y.o. female in NAD. AAO x 3.  Vascular Examination: Patient has palpable DP pulse, absent PT pulse bilateral.  Delayed capillary refill bilateral toes.  Sparse digital hair bilateral.  Proximal to distal cooling WNL bilateral.    Dermatological Examination: Interspaces are clear with no open lesions noted bilateral.  Nails are 3-32mm thick, with yellowish/brown discoloration, subungual debris and distal onycholysis x10.  There is pain with compression of nails x10.    Neurological Examination: Protective sensation intact b/l LE. Vibratory sensation intact b/l LE.  Musculoskeletal Examination: Muscle strength 5/5 to all LE muscle groups b/l.   Patient qualifies for at-risk foot care because of Diabetes with PVD .  Assessment/Plan: 1. Dermatophytosis of nail   2. Type II diabetes mellitus with peripheral circulatory disorder (HCC)    Mycotic nails x10 were sharply debrided with sterile nail nippers and power debriding burr to decrease bulk and length   Return in about 3 months (around 07/21/2023), or DFC/nail trim.   Clerance Lav, DPM, FACFAS [facility]     [address] [place], [place] [address]                Office  [phone]  Fax [phone]

## 2023-04-26 DIAGNOSIS — N179 Acute kidney failure, unspecified: Secondary | ICD-10-CM | POA: Diagnosis not present

## 2023-04-26 DIAGNOSIS — N200 Calculus of kidney: Secondary | ICD-10-CM | POA: Diagnosis not present

## 2023-05-08 DIAGNOSIS — E1159 Type 2 diabetes mellitus with other circulatory complications: Secondary | ICD-10-CM | POA: Diagnosis not present

## 2023-05-31 DIAGNOSIS — K219 Gastro-esophageal reflux disease without esophagitis: Secondary | ICD-10-CM | POA: Diagnosis not present

## 2023-05-31 DIAGNOSIS — K5900 Constipation, unspecified: Secondary | ICD-10-CM | POA: Diagnosis not present

## 2023-05-31 DIAGNOSIS — J45909 Unspecified asthma, uncomplicated: Secondary | ICD-10-CM | POA: Diagnosis not present

## 2023-05-31 DIAGNOSIS — L299 Pruritus, unspecified: Secondary | ICD-10-CM | POA: Diagnosis not present

## 2023-05-31 DIAGNOSIS — I10 Essential (primary) hypertension: Secondary | ICD-10-CM | POA: Diagnosis not present

## 2023-05-31 DIAGNOSIS — Z7689 Persons encountering health services in other specified circumstances: Secondary | ICD-10-CM | POA: Diagnosis not present

## 2023-05-31 DIAGNOSIS — B372 Candidiasis of skin and nail: Secondary | ICD-10-CM | POA: Diagnosis not present

## 2023-06-07 DIAGNOSIS — E1159 Type 2 diabetes mellitus with other circulatory complications: Secondary | ICD-10-CM | POA: Diagnosis not present

## 2023-06-10 ENCOUNTER — Other Ambulatory Visit: Payer: Self-pay

## 2023-06-10 MED ORDER — PRALUENT 150 MG/ML ~~LOC~~ SOAJ: 1.0000 mL | SUBCUTANEOUS | 0 refills | Status: DC

## 2023-06-13 ENCOUNTER — Telehealth: Payer: Self-pay

## 2023-06-13 NOTE — Telephone Encounter (Signed)
Fax received from [facility] on [address] stating to call [phone] to initiate prior authorization on her Praluent.

## 2023-06-14 ENCOUNTER — Other Ambulatory Visit (HOSPITAL_COMMUNITY): Payer: Self-pay

## 2023-06-14 DIAGNOSIS — E1165 Type 2 diabetes mellitus with hyperglycemia: Secondary | ICD-10-CM | POA: Diagnosis not present

## 2023-06-14 DIAGNOSIS — Z794 Long term (current) use of insulin: Secondary | ICD-10-CM | POA: Diagnosis not present

## 2023-06-14 MED ORDER — REPATHA SURECLICK 140 MG/ML ~~LOC~~ SOAJ: 140.0000 mg | SUBCUTANEOUS | 11 refills | Status: DC

## 2023-06-14 NOTE — Telephone Encounter (Signed)
Chris with [facility] is following up. He states a PA will be needed for Repatha.   PA Dept#: [phone] direct line#: [phone]

## 2023-06-14 NOTE — Telephone Encounter (Signed)
Pharmacy Patient Advocate Encounter   Received notification from Pt Calls Messages that prior authorization for Praluent 150 mg/ml Soaj is required/requested.   Insurance verification completed.   The patient is insured through [employer] .   Per test claim:  Repatha is preferred by the ins.  If suggested medication is appropriate, Please send in a new RX and discontinue this one. If not, please advise as to why it's not appropriate so that we may request a Prior Authorization.

## 2023-06-14 NOTE — Telephone Encounter (Signed)
Left detailed message for patient that her insurance no longer covers Praluent but does cover Repatha. Rx sent to pharmacy.

## 2023-06-15 ENCOUNTER — Ambulatory Visit: Payer: Medicare HMO | Attending: Cardiology | Admitting: Cardiology

## 2023-06-15 ENCOUNTER — Telehealth: Payer: Self-pay

## 2023-06-15 ENCOUNTER — Other Ambulatory Visit (HOSPITAL_COMMUNITY): Payer: Self-pay

## 2023-06-15 ENCOUNTER — Encounter: Payer: Self-pay | Admitting: Cardiology

## 2023-06-15 VITALS — BP 140/88 | HR 77 | Ht 61.0 in | Wt 155.8 lb

## 2023-06-15 DIAGNOSIS — Z951 Presence of aortocoronary bypass graft: Secondary | ICD-10-CM | POA: Diagnosis not present

## 2023-06-15 DIAGNOSIS — I2510 Atherosclerotic heart disease of native coronary artery without angina pectoris: Secondary | ICD-10-CM | POA: Diagnosis not present

## 2023-06-15 DIAGNOSIS — I10 Essential (primary) hypertension: Secondary | ICD-10-CM

## 2023-06-15 DIAGNOSIS — I6930 Unspecified sequelae of cerebral infarction: Secondary | ICD-10-CM | POA: Diagnosis not present

## 2023-06-15 DIAGNOSIS — I739 Peripheral vascular disease, unspecified: Secondary | ICD-10-CM | POA: Diagnosis not present

## 2023-06-15 MED ORDER — REPATHA SURECLICK 140 MG/ML ~~LOC~~ SOAJ: 140.0000 mg | SUBCUTANEOUS | 11 refills | Status: DC

## 2023-06-15 MED ORDER — NITROGLYCERIN 0.4 MG SL SUBL: 0.4000 mg | SUBLINGUAL_TABLET | SUBLINGUAL | 3 refills | Status: DC | PRN

## 2023-06-15 NOTE — Telephone Encounter (Signed)
PA request has been Submitted. New Encounter created for follow up. For additional info see Pharmacy Prior Auth telephone encounter from 06/15/23.

## 2023-06-15 NOTE — Progress Notes (Signed)
Cardiology Office Note:    Date:  06/15/2023   ID:  Tonya Richard, DOB 1946/08/27, MRN [id number]  PCP:  Gordy Councilman, FNP  Cardiologist:  Gypsy Balsam, MD    Referring MD: Gordy Councilman, FNP   No chief complaint on file.   History of Present Illness:    Tonya Richard is a 77 y.o. female past medical history significant for coronary artery disease, status post coronary bypass graft done in 2012, now she has occluded 2 grafts, stress test done irregular so was negative.  Additional problem includes poorly controlled diabetes with latest hemoglobin A1c of 13!  Which was at the end of last year, dyslipidemia which seems to be well-tolerated with PCSK9 agent, CVA on antiplatelet therapy. Comes today to months for follow-up initially she says she is doing fine but later she told me she got 1 episode of chest pain that happened about 2 weeks ago when she was making her bed.  She did not have nitroglycerin she did not take it.  Since that time no more trouble.  Past Medical History:  Diagnosis Date   Abnormal stress test 08/09/2017   Formatting of this note might be different from the original. Added automatically from request for surgery [id number]   Acute renal failure (HCC) 08/16/2014   Asthma    Asthma, chronic 08/16/2014   Benign essential hypertension 08/09/2017   CAD (coronary artery disease), native coronary artery    h/o CABG, stent   Chest pain 08/09/2017   Chronic coronary artery disease 04/16/2019   Formatting of this note might be different from the original. 3-Vessel   COPD (chronic obstructive pulmonary disease) (HCC) 10/13/2018   DM type 2 (diabetes mellitus, type 2) (HCC)    Enteritis due to Clostridium difficile 08/17/2014   Essential hypertension, benign 08/16/2014   GERD (gastroesophageal reflux disease)    Hx of CABG 08/09/2017   Hyperkalemia, mild 08/16/2014   Hyperlipidemia    Hypertension    Hypoglycemia 08/16/2014   Hypothyroidism    Late effect of  cerebrovascular accident (CVA) 03/18/2020   Long-term use of aspirin therapy 04/16/2019   Metabolic acidosis 08/16/2014   Mixed hyperlipidemia 08/09/2017   Morbid obesity (HCC) 08/16/2014   Myocardial infarction [facility])    PAD (peripheral artery disease) (HCC) 08/09/2017   Peripheral vascular disease (HCC) 05/12/2020   Pre-op evaluation 06/29/2016   Shortness of breath    Strep pharyngitis 08/16/2014   Type 2 diabetes mellitus with circulatory disorder, with long-term current use of insulin (HCC) 09/06/2017   Vomiting and diarrhea 08/16/2014    Past Surgical History:  Procedure Laterality Date   CORONARY ARTERY BYPASS GRAFT     VAGINAL HYSTERECTOMY     WRIST SURGERY      Current Medications: Current Meds  Medication Sig   BRILINTA 90 MG TABS tablet Take 1 tablet (90 mg total) by mouth 2 (two) times daily.   budesonide-formoterol (SYMBICORT) 160-4.5 MCG/ACT inhaler Inhale 2 puffs into the lungs 2 (two) times daily.   ezetimibe (ZETIA) 10 MG tablet Take 10 mg by mouth daily.   feeding supplement, GLUCERNA SHAKE, (GLUCERNA SHAKE) LIQD Take 237 mLs by mouth 3 (three) times daily between meals.   ferrous sulfate 325 (65 FE) MG tablet Take 325 mg by mouth daily with breakfast.   hydrocortisone (ANUSOL-HC) 2.5 % rectal cream Place 1 Application rectally 2 (two) times daily.   insulin aspart (NOVOLOG) 100 UNIT/ML injection Inject 0-20 Units into the skin 3 (three) times  daily with meals.   insulin aspart (NOVOLOG) 100 UNIT/ML injection Inject 0-5 Units into the skin at bedtime.   Insulin Glargine w/ Trans Port (BASAGLAR TEMPO PEN) 100 UNIT/ML SOPN Inject 70 Units into the skin in the morning.   ipratropium-albuterol (DUONEB) 0.5-2.5 (3) MG/3ML SOLN Take 3 mLs by nebulization 4 (four) times daily as needed (shortness of breath/wheezing).   isosorbide mononitrate (IMDUR) 120 MG 24 hr tablet Take 1 tablet (120 mg total) by mouth daily.   levothyroxine (SYNTHROID, LEVOTHROID) 50 MCG tablet Take 50 mcg by  mouth daily before breakfast.   metoprolol succinate (TOPROL-XL) 25 MG 24 hr tablet Take 3 tablets (75 mg total) by mouth daily. Take with or immediately following a meal.   multivitamin (RENA-VIT) TABS tablet Take 1 tablet by mouth at bedtime.   nystatin cream (MYCOSTATIN) Apply 1 Application topically 2 (two) times daily.   pantoprazole (PROTONIX) 40 MG tablet Take 40 mg by mouth daily.   phenazopyridine (PYRIDIUM) 95 MG tablet Take 95 mg by mouth 3 (three) times daily as needed for pain.   polyethylene glycol powder (GLYCOLAX/MIRALAX) 17 GM/SCOOP powder Take 17 g by mouth daily as needed for constipation.   rosuvastatin (CRESTOR) 10 MG tablet Take 1 tablet (10 mg total) by mouth daily.   tamsulosin (FLOMAX) 0.4 MG CAPS capsule Take 0.4 mg by mouth daily after breakfast.   trimethoprim (TRIMPEX) 100 MG tablet Take 100 mg by mouth daily.   TRULICITY 1.5 MG/0.5ML SOPN Inject 0.5 mLs into the skin once a week.   Vitamin D, Ergocalciferol, (DRISDOL) 1.25 MG (50000 UNIT) CAPS capsule Take 50,000 Units by mouth once a week.     Allergies:   Atorvastatin   Social History   Socioeconomic History   Marital status: Widowed    Spouse name: Not on file   Number of children: Not on file   Years of education: Not on file   Highest education level: Not on file  Occupational History   Not on file  Tobacco Use   Smoking status: Former   Smokeless tobacco: Current    Types: Snuff  Substance and Sexual Activity   Alcohol use: No   Drug use: No   Sexual activity: Not on file  Other Topics Concern   Not on file  Social History Narrative   Not on file   Social Determinants of Health   Financial Resource Strain: Not on file  Food Insecurity: Low Risk  (06/14/2023)   Received from [facility]   Food vital sign    Within the past 12 months, you worried that your food would run out before you got money to buy more: Never true    Within the past 12 months, the food you bought just didn't last  and you didn't have money to get more. : Never true  Transportation Needs: Not on file (06/14/2023)  Physical Activity: Not on file  Stress: Not on file  Social Connections: Not on file     Family History: The patient's family history includes Diabetes in her father; Heart disease in her brother and father. ROS:   Please see the history of present illness.    All 14 point review of systems negative except as described per history of present illness  EKGs/Labs/Other Studies Reviewed:    EKG Interpretation Date/Time:  Wednesday June 15 2023 09:51:15 EDT Ventricular Rate:  77 PR Interval:  150 QRS Duration:  80 QT Interval:  386 QTC Calculation: 436 R Axis:   -  21  Text Interpretation: Normal sinus rhythm Anterior infarct , age undetermined Abnormal ECG When compared with ECG of 20-Jun-2021 14:59, PREVIOUS ECG IS PRESENT Confirmed by Gypsy Balsam [phone]) on 06/15/2023 9:57:24 AM    Recent Labs: No results found for requested labs within last 365 days.  Recent Lipid Panel No results found for: "CHOL", "TRIG", "HDL", "CHOLHDL", "VLDL", "LDLCALC", "LDLDIRECT"  Physical Exam:    VS:  BP (!) 140/88 (BP Location: Left Arm, Patient Position: Sitting, Cuff Size: Normal)   Pulse 77   Ht 5\' 1"  (1.549 m)   Wt 155 lb 12.8 oz (70.7 kg)   SpO2 96%   BMI 29.44 kg/m     Wt Readings from Last 3 Encounters:  06/15/23 155 lb 12.8 oz (70.7 kg)  10/28/22 165 lb (74.8 kg)  12/15/21 155 lb (70.3 kg)     GEN:  Well nourished, well developed in no acute distress HEENT: Normal NECK: No JVD; No carotid bruits LYMPHATICS: No lymphadenopathy CARDIAC: RRR, no murmurs, no rubs, no gallops RESPIRATORY:  Clear to auscultation without rales, wheezing or rhonchi  ABDOMEN: Soft, non-tender, non-distended MUSCULOSKELETAL:  No edema; No deformity  SKIN: Warm and dry LOWER EXTREMITIES: no swelling NEUROLOGIC:  Alert and oriented x 3 PSYCHIATRIC:  Normal affect   ASSESSMENT:    1. PAD  (peripheral artery disease) (HCC)   2. Benign essential hypertension   3. Chronic coronary artery disease   4. Coronary artery disease involving native coronary artery of native heart without angina pectoris   5. Hx of CABG   6. Late effect of cerebrovascular accident (CVA)    PLAN:    In order of problems listed above:  Coronary artery disease.  She did have 1 episode of chest pain more than 2 weeks ago I will give her a fresh prescription for nitroglycerin and ask her to let me know if she has recurrences of chest pain. Essential hypertension: Blood pressure well-controlled continue present management. History of coronary artery disease concerned about her chest pain.  Will continue monitoring.    Medication Adjustments/Labs and Tests Ordered: Current medicines are reviewed at length with the patient today.  Concerns regarding medicines are outlined above.  Orders Placed This Encounter  Procedures   EKG 12-Lead   Medication changes: No orders of the defined types were placed in this encounter.   Signed, Georgeanna Lea, MD, [facility] 06/15/2023 10:08 AM    [facility]

## 2023-06-15 NOTE — Addendum Note (Signed)
Addended by: Roosvelt Harps R on: 06/15/2023 10:13 AM   Modules accepted: Orders

## 2023-06-15 NOTE — Addendum Note (Signed)
Addended by: Thaddeus Evitts E on: 06/15/2023 12:35 PM   Modules accepted: Orders

## 2023-06-15 NOTE — Telephone Encounter (Signed)
 Pharmacy Patient Advocate Encounter  Received notification from [facility] that Prior Authorization for REPATHA has been APPROVED from 11/29/22 to 11/29/23.Marland Kitchen

## 2023-06-15 NOTE — Patient Instructions (Signed)
Medication Instructions:  Your physician has recommended you make the following change in your medication:  Nitroglycerin 0.4 mg sublingual (under your tongue) as needed for chest pain. If experiencing chest pain, stop what you are doing and sit down. Take 1 nitroglycerin and wait 5 minutes. If chest pain continues, take another nitroglycerin and wait 5 minutes. If chest pain does not subside, take 1 more nitroglycerin and dial 911. You make take a total of 3 nitroglycerin in a 15 minute time frame.   *If you need a refill on your cardiac medications before your next appointment, please call your pharmacy*   Lab Work: NONE If you have labs (blood work) drawn today and your tests are completely normal, you will receive your results only by: MyChart Message (if you have MyChart) OR A paper copy in the mail If you have any lab test that is abnormal or we need to change your treatment, we will call you to review the results.   Testing/Procedures: NONE   Follow-Up: At [facility], you and your health needs are our priority.  As part of our continuing mission to provide you with exceptional heart care, we have created designated Provider Care Teams.  These Care Teams include your primary Cardiologist (physician) and Advanced Practice Providers (APPs -  Physician Assistants and Nurse Practitioners) who all work together to provide you with the care you need, when you need it.  We recommend signing up for the patient portal called "MyChart".  Sign up information is provided on this After Visit Summary.  MyChart is used to connect with patients for Virtual Visits (Telemedicine).  Patients are able to view lab/test results, encounter notes, upcoming appointments, etc.  Non-urgent messages can be sent to your provider as well.   To learn more about what you can do with MyChart, go to [email or web address].    Your next appointment:   6 month(s)  Provider:   Gypsy Balsam, MD     Other Instructions

## 2023-06-15 NOTE — Telephone Encounter (Signed)
Pharmacy Patient Advocate Encounter   Received notification from Pt Calls Messages that prior authorization for REPATHA is required/requested.   Insurance verification completed.   The patient is insured through [facility] .   Per test claim: PA submitted to [facility] via CoverMyMeds Key/confirmation #/EOC [id number] Status is pending

## 2023-06-27 DIAGNOSIS — N184 Chronic kidney disease, stage 4 (severe): Secondary | ICD-10-CM | POA: Diagnosis not present

## 2023-06-27 DIAGNOSIS — R339 Retention of urine, unspecified: Secondary | ICD-10-CM | POA: Diagnosis not present

## 2023-06-27 DIAGNOSIS — N3946 Mixed incontinence: Secondary | ICD-10-CM | POA: Diagnosis not present

## 2023-06-27 DIAGNOSIS — N390 Urinary tract infection, site not specified: Secondary | ICD-10-CM | POA: Diagnosis not present

## 2023-07-08 DIAGNOSIS — E1159 Type 2 diabetes mellitus with other circulatory complications: Secondary | ICD-10-CM | POA: Diagnosis not present

## 2023-07-13 DIAGNOSIS — E039 Hypothyroidism, unspecified: Secondary | ICD-10-CM | POA: Diagnosis not present

## 2023-07-13 DIAGNOSIS — M1990 Unspecified osteoarthritis, unspecified site: Secondary | ICD-10-CM | POA: Diagnosis not present

## 2023-07-13 DIAGNOSIS — I252 Old myocardial infarction: Secondary | ICD-10-CM | POA: Diagnosis not present

## 2023-07-13 DIAGNOSIS — K5900 Constipation, unspecified: Secondary | ICD-10-CM | POA: Diagnosis not present

## 2023-07-13 DIAGNOSIS — E785 Hyperlipidemia, unspecified: Secondary | ICD-10-CM | POA: Diagnosis not present

## 2023-07-13 DIAGNOSIS — M810 Age-related osteoporosis without current pathological fracture: Secondary | ICD-10-CM | POA: Diagnosis not present

## 2023-07-13 DIAGNOSIS — I951 Orthostatic hypotension: Secondary | ICD-10-CM | POA: Diagnosis not present

## 2023-07-13 DIAGNOSIS — K219 Gastro-esophageal reflux disease without esophagitis: Secondary | ICD-10-CM | POA: Diagnosis not present

## 2023-07-13 DIAGNOSIS — N189 Chronic kidney disease, unspecified: Secondary | ICD-10-CM | POA: Diagnosis not present

## 2023-07-13 DIAGNOSIS — M4800 Spinal stenosis, site unspecified: Secondary | ICD-10-CM | POA: Diagnosis not present

## 2023-07-13 DIAGNOSIS — F419 Anxiety disorder, unspecified: Secondary | ICD-10-CM | POA: Diagnosis not present

## 2023-07-13 DIAGNOSIS — R32 Unspecified urinary incontinence: Secondary | ICD-10-CM | POA: Diagnosis not present

## 2023-07-21 ENCOUNTER — Ambulatory Visit: Payer: Medicare HMO | Admitting: Podiatry

## 2023-07-22 ENCOUNTER — Ambulatory Visit: Payer: Medicare HMO | Admitting: Podiatry

## 2023-07-22 DIAGNOSIS — L853 Xerosis cutis: Secondary | ICD-10-CM | POA: Diagnosis not present

## 2023-07-22 DIAGNOSIS — B351 Tinea unguium: Secondary | ICD-10-CM

## 2023-07-22 DIAGNOSIS — M79675 Pain in left toe(s): Secondary | ICD-10-CM

## 2023-07-22 DIAGNOSIS — M79674 Pain in right toe(s): Secondary | ICD-10-CM

## 2023-07-22 NOTE — Progress Notes (Unsigned)
Subjective:  Patient ID: Tonya Richard, female    DOB: [birth year],  MRN: [id number]   Tonya Richard presents to clinic today for:  Chief Complaint  Patient presents with   Diabetes    [facility] BS - 350 A1C - DK  . Patient notes nails are thick, discolored, elongated and painful in shoegear when trying to ambulate.  Patient also notes her skin is dry and wants any recommendations for dry skin cream.  She uses a walker to assist with ambulation.  She notes her blood sugar has been high and was over 300 mg/dL today.  PCP is Gordy Councilman, FNP.  Past Medical History:  Diagnosis Date   Abnormal stress test 08/09/2017   Formatting of this note might be different from the original. Added automatically from request for surgery [id number]   Acute renal failure (HCC) 08/16/2014   Asthma    Asthma, chronic 08/16/2014   Benign essential hypertension 08/09/2017   CAD (coronary artery disease), native coronary artery    h/o CABG, stent   Chest pain 08/09/2017   Chronic coronary artery disease 04/16/2019   Formatting of this note might be different from the original. 3-Vessel   COPD (chronic obstructive pulmonary disease) (HCC) 10/13/2018   DM type 2 (diabetes mellitus, type 2) (HCC)    Enteritis due to Clostridium difficile 08/17/2014   Essential hypertension, benign 08/16/2014   GERD (gastroesophageal reflux disease)    Hx of CABG 08/09/2017   Hyperkalemia, mild 08/16/2014   Hyperlipidemia    Hypertension    Hypoglycemia 08/16/2014   Hypothyroidism    Late effect of cerebrovascular accident (CVA) 03/18/2020   Long-term use of aspirin therapy 04/16/2019   Metabolic acidosis 08/16/2014   Mixed hyperlipidemia 08/09/2017   Morbid obesity (HCC) 08/16/2014   Myocardial infarction [facility])    PAD (peripheral artery disease) (HCC) 08/09/2017   Peripheral vascular disease (HCC) 05/12/2020   Pre-op evaluation 06/29/2016   Shortness of breath    Strep pharyngitis 08/16/2014   Type 2 diabetes mellitus with  circulatory disorder, with long-term current use of insulin (HCC) 09/06/2017   Vomiting and diarrhea 08/16/2014    Allergies  Allergen Reactions   Atorvastatin     Other reaction(s): Other (See Comments) Muscle weakness and pain in legs    Review of Systems: Negative except as noted in the HPI.  Objective:  Tonya Richard is a pleasant 77 y.o. female in NAD. AAO x 3.  Vascular Examination: Capillary refill time is 3-5 seconds to toes bilateral.  Diminished pedal pulses b/l LE. Digital hair sparse b/l.  Skin temperature gradient WNL b/l.   Dermatological Examination: Pedal skin with normal turgor and tone b/l. No open wounds. No interdigital macerations b/l. Toenails x10 are 5-6 mm thick, discolored, dystrophic with subungual debris. There is pain with compression of the nail plates.  They are elongated x10.  The skin is dry and scaly.  There is no erythema along the scaly/peeling skin.  Assessment/Plan: 1. Pain due to onychomycosis of toenails of both feet   2. Xerosis of skin     The mycotic toenails were sharply debrided x10 with sterile nail nippers and a power debriding burr to decrease bulk/thickness and length.    Recommended Goldbond cream for diabetics, Eucerin cream for diabetics, or Lubriderm cream to be applied twice daily for the dry skin management.  Proper blood sugar control may also help with this.  Return in about 3 months (around 10/22/2023) for [facility].  Clerance Lav, DPM, FACFAS [facility]     [address] [place], [place] [address]                Office [phone]  Fax [phone]

## 2023-08-08 DIAGNOSIS — E1159 Type 2 diabetes mellitus with other circulatory complications: Secondary | ICD-10-CM | POA: Diagnosis not present

## 2023-08-09 DIAGNOSIS — E1165 Type 2 diabetes mellitus with hyperglycemia: Secondary | ICD-10-CM | POA: Diagnosis not present

## 2023-08-09 DIAGNOSIS — Z1211 Encounter for screening for malignant neoplasm of colon: Secondary | ICD-10-CM | POA: Diagnosis not present

## 2023-08-09 DIAGNOSIS — N3001 Acute cystitis with hematuria: Secondary | ICD-10-CM | POA: Diagnosis not present

## 2023-08-09 DIAGNOSIS — F0390 Unspecified dementia without behavioral disturbance: Secondary | ICD-10-CM | POA: Diagnosis not present

## 2023-08-09 DIAGNOSIS — N289 Disorder of kidney and ureter, unspecified: Secondary | ICD-10-CM | POA: Diagnosis not present

## 2023-08-09 DIAGNOSIS — D509 Iron deficiency anemia, unspecified: Secondary | ICD-10-CM | POA: Diagnosis not present

## 2023-08-09 DIAGNOSIS — R269 Unspecified abnormalities of gait and mobility: Secondary | ICD-10-CM | POA: Diagnosis not present

## 2023-08-09 DIAGNOSIS — Z8673 Personal history of transient ischemic attack (TIA), and cerebral infarction without residual deficits: Secondary | ICD-10-CM | POA: Diagnosis not present

## 2023-08-09 DIAGNOSIS — H539 Unspecified visual disturbance: Secondary | ICD-10-CM | POA: Diagnosis not present

## 2023-08-09 DIAGNOSIS — E119 Type 2 diabetes mellitus without complications: Secondary | ICD-10-CM | POA: Diagnosis not present

## 2023-08-09 DIAGNOSIS — R739 Hyperglycemia, unspecified: Secondary | ICD-10-CM | POA: Diagnosis not present

## 2023-08-09 DIAGNOSIS — I10 Essential (primary) hypertension: Secondary | ICD-10-CM | POA: Diagnosis not present

## 2023-08-09 DIAGNOSIS — H9190 Unspecified hearing loss, unspecified ear: Secondary | ICD-10-CM | POA: Diagnosis not present

## 2023-08-09 DIAGNOSIS — Z743 Need for continuous supervision: Secondary | ICD-10-CM | POA: Diagnosis not present

## 2023-09-07 DIAGNOSIS — E1159 Type 2 diabetes mellitus with other circulatory complications: Secondary | ICD-10-CM | POA: Diagnosis not present

## 2023-09-28 DIAGNOSIS — R339 Retention of urine, unspecified: Secondary | ICD-10-CM | POA: Diagnosis not present

## 2023-09-28 DIAGNOSIS — N3946 Mixed incontinence: Secondary | ICD-10-CM | POA: Diagnosis not present

## 2023-09-28 DIAGNOSIS — N390 Urinary tract infection, site not specified: Secondary | ICD-10-CM | POA: Diagnosis not present

## 2023-10-02 ENCOUNTER — Other Ambulatory Visit: Payer: Self-pay | Admitting: Cardiology

## 2023-10-06 ENCOUNTER — Telehealth: Payer: Self-pay | Admitting: Cardiology

## 2023-10-06 NOTE — Telephone Encounter (Signed)
Spoke with pharmacy and ok'd directions of Max of 3 Nitroglycerin in 15 min then call 911.

## 2023-10-06 NOTE — Telephone Encounter (Signed)
Pt c/o medication issue:  1. Name of Medication:   nitroGLYCERIN (NITROSTAT) 0.4 MG SL tablet   2. How are you currently taking this medication (dosage and times per day)?   3. Are you having a reaction (difficulty breathing--STAT)?   4. What is your medication issue?   Caller Clydie Braun) stated they want to add additional instructions to this medication - "Limit of 3 tablets in 15 minutes and to call 911".  Ref# [id number]

## 2023-10-13 DIAGNOSIS — H903 Sensorineural hearing loss, bilateral: Secondary | ICD-10-CM | POA: Diagnosis not present

## 2023-10-13 DIAGNOSIS — H9313 Tinnitus, bilateral: Secondary | ICD-10-CM | POA: Diagnosis not present

## 2023-10-13 DIAGNOSIS — H6121 Impacted cerumen, right ear: Secondary | ICD-10-CM | POA: Diagnosis not present

## 2023-10-20 ENCOUNTER — Ambulatory Visit (INDEPENDENT_AMBULATORY_CARE_PROVIDER_SITE_OTHER): Payer: Medicare HMO | Admitting: Podiatry

## 2023-10-20 DIAGNOSIS — M79674 Pain in right toe(s): Secondary | ICD-10-CM | POA: Diagnosis not present

## 2023-10-20 DIAGNOSIS — B351 Tinea unguium: Secondary | ICD-10-CM | POA: Diagnosis not present

## 2023-10-20 DIAGNOSIS — M79675 Pain in left toe(s): Secondary | ICD-10-CM

## 2023-10-20 NOTE — Progress Notes (Signed)
Subjective:  Patient ID: Tonya Richard, female    DOB: 1946/08/24,  MRN: [id number]  Tonya Richard presents to clinic today for:  Chief Complaint  Patient presents with   Diabetes    [facility]   Patient notes nails are thick, discolored, elongated and painful in shoegear when trying to ambulate.  Notes some hard skin at the cuticle area of the great toenails  PCP is Gordy Councilman, FNP.  Past Medical History:  Diagnosis Date   Abnormal stress test 08/09/2017   Formatting of this note might be different from the original. Added automatically from request for surgery [phone]   Acute renal failure (HCC) 08/16/2014   Asthma    Asthma, chronic 08/16/2014   Benign essential hypertension 08/09/2017   CAD (coronary artery disease), native coronary artery    h/o CABG, stent   Chest pain 08/09/2017   Chronic coronary artery disease 04/16/2019   Formatting of this note might be different from the original. 3-Vessel   COPD (chronic obstructive pulmonary disease) (HCC) 10/13/2018   DM type 2 (diabetes mellitus, type 2) (HCC)    Enteritis due to Clostridium difficile 08/17/2014   Essential hypertension, benign 08/16/2014   GERD (gastroesophageal reflux disease)    Hx of CABG 08/09/2017   Hyperkalemia, mild 08/16/2014   Hyperlipidemia    Hypertension    Hypoglycemia 08/16/2014   Hypothyroidism    Late effect of cerebrovascular accident (CVA) 03/18/2020   Long-term use of aspirin therapy 04/16/2019   Metabolic acidosis 08/16/2014   Mixed hyperlipidemia 08/09/2017   Morbid obesity (HCC) 08/16/2014   Myocardial infarction [facility])    PAD (peripheral artery disease) (HCC) 08/09/2017   Peripheral vascular disease (HCC) 05/12/2020   Pre-op evaluation 06/29/2016   Shortness of breath    Strep pharyngitis 08/16/2014   Type 2 diabetes mellitus with circulatory disorder, with long-term current use of insulin (HCC) 09/06/2017   Vomiting and diarrhea 08/16/2014   Past Surgical History:  Procedure Laterality Date    CORONARY ARTERY BYPASS GRAFT     VAGINAL HYSTERECTOMY     WRIST SURGERY     Allergies  Allergen Reactions   Atorvastatin     Other reaction(s): Other (See Comments) Muscle weakness and pain in legs    Review of Systems: Negative except as noted in the HPI.  Objective:  [name] is a pleasant 77 y.o. female in NAD. AAO x 3.  Vascular Examination: Capillary refill time is 3-5 seconds to toes bilateral. Palpable pedal pulses b/l LE. Digital hair present b/l.  Skin temperature gradient WNL b/l. No varicosities b/l. No cyanosis noted b/l.   Dermatological Examination: Pedal skin with normal turgor, texture and tone b/l. No open wounds. No interdigital macerations b/l. Toenails x10 are 3mm thick, discolored, dystrophic with subungual debris. There is pain with compression of the nail plates.  They are elongated x10.  Dry, hardened cuticle on proximal nail fold of hallux bilateral.  Assessment/Plan: 1. Pain due to onychomycosis of toenails of both feet    The mycotic toenails were sharply debrided x10 with sterile nail nippers and a power debriding burr to decrease bulk/thickness and length.    Patient advised to use cuticle oil, vit. E oil, or vicks vaporub on cuticle 3-4x per week until skin softens.  Return in about 3 months (around 01/20/2024) for [facility].   Clerance Lav, DPM, FACFAS [facility]     [address]  [place], [place] [address]                Office [phone]  Fax [phone]

## 2023-11-02 DIAGNOSIS — E1159 Type 2 diabetes mellitus with other circulatory complications: Secondary | ICD-10-CM | POA: Diagnosis not present

## 2023-11-08 DIAGNOSIS — B372 Candidiasis of skin and nail: Secondary | ICD-10-CM | POA: Diagnosis not present

## 2023-11-08 DIAGNOSIS — E119 Type 2 diabetes mellitus without complications: Secondary | ICD-10-CM | POA: Diagnosis not present

## 2023-11-08 DIAGNOSIS — F0390 Unspecified dementia without behavioral disturbance: Secondary | ICD-10-CM | POA: Diagnosis not present

## 2023-11-08 DIAGNOSIS — R32 Unspecified urinary incontinence: Secondary | ICD-10-CM | POA: Diagnosis not present

## 2023-11-08 DIAGNOSIS — E559 Vitamin D deficiency, unspecified: Secondary | ICD-10-CM | POA: Diagnosis not present

## 2023-11-08 DIAGNOSIS — E039 Hypothyroidism, unspecified: Secondary | ICD-10-CM | POA: Diagnosis not present

## 2023-11-08 DIAGNOSIS — D509 Iron deficiency anemia, unspecified: Secondary | ICD-10-CM | POA: Diagnosis not present

## 2023-11-08 DIAGNOSIS — J449 Chronic obstructive pulmonary disease, unspecified: Secondary | ICD-10-CM | POA: Diagnosis not present

## 2023-11-10 DIAGNOSIS — R739 Hyperglycemia, unspecified: Secondary | ICD-10-CM | POA: Diagnosis not present

## 2023-11-10 DIAGNOSIS — K6289 Other specified diseases of anus and rectum: Secondary | ICD-10-CM | POA: Diagnosis not present

## 2023-11-10 DIAGNOSIS — Z792 Long term (current) use of antibiotics: Secondary | ICD-10-CM | POA: Diagnosis not present

## 2023-11-10 DIAGNOSIS — Z794 Long term (current) use of insulin: Secondary | ICD-10-CM | POA: Diagnosis not present

## 2023-11-10 DIAGNOSIS — Z79899 Other long term (current) drug therapy: Secondary | ICD-10-CM | POA: Diagnosis not present

## 2023-11-10 DIAGNOSIS — K5900 Constipation, unspecified: Secondary | ICD-10-CM | POA: Diagnosis not present

## 2023-11-25 ENCOUNTER — Other Ambulatory Visit: Payer: Self-pay | Admitting: Cardiology

## 2023-12-03 DIAGNOSIS — E1159 Type 2 diabetes mellitus with other circulatory complications: Secondary | ICD-10-CM | POA: Diagnosis not present

## 2024-01-03 DIAGNOSIS — Z794 Long term (current) use of insulin: Secondary | ICD-10-CM | POA: Diagnosis not present

## 2024-01-03 DIAGNOSIS — Z978 Presence of other specified devices: Secondary | ICD-10-CM | POA: Diagnosis not present

## 2024-01-03 DIAGNOSIS — Z7984 Long term (current) use of oral hypoglycemic drugs: Secondary | ICD-10-CM | POA: Diagnosis not present

## 2024-01-03 DIAGNOSIS — E1159 Type 2 diabetes mellitus with other circulatory complications: Secondary | ICD-10-CM | POA: Diagnosis not present

## 2024-01-03 DIAGNOSIS — E1165 Type 2 diabetes mellitus with hyperglycemia: Secondary | ICD-10-CM | POA: Diagnosis not present

## 2024-01-03 DIAGNOSIS — E785 Hyperlipidemia, unspecified: Secondary | ICD-10-CM | POA: Diagnosis not present

## 2024-01-03 DIAGNOSIS — I10 Essential (primary) hypertension: Secondary | ICD-10-CM | POA: Diagnosis not present

## 2024-01-05 DIAGNOSIS — E039 Hypothyroidism, unspecified: Secondary | ICD-10-CM | POA: Diagnosis not present

## 2024-01-05 DIAGNOSIS — E119 Type 2 diabetes mellitus without complications: Secondary | ICD-10-CM | POA: Diagnosis not present

## 2024-01-05 DIAGNOSIS — R32 Unspecified urinary incontinence: Secondary | ICD-10-CM | POA: Diagnosis not present

## 2024-01-05 DIAGNOSIS — E559 Vitamin D deficiency, unspecified: Secondary | ICD-10-CM | POA: Diagnosis not present

## 2024-01-05 DIAGNOSIS — J449 Chronic obstructive pulmonary disease, unspecified: Secondary | ICD-10-CM | POA: Diagnosis not present

## 2024-01-05 DIAGNOSIS — F0390 Unspecified dementia without behavioral disturbance: Secondary | ICD-10-CM | POA: Diagnosis not present

## 2024-01-05 DIAGNOSIS — B372 Candidiasis of skin and nail: Secondary | ICD-10-CM | POA: Diagnosis not present

## 2024-01-05 DIAGNOSIS — D509 Iron deficiency anemia, unspecified: Secondary | ICD-10-CM | POA: Diagnosis not present

## 2024-01-05 DIAGNOSIS — Z794 Long term (current) use of insulin: Secondary | ICD-10-CM | POA: Diagnosis not present

## 2024-01-05 DIAGNOSIS — Z7985 Long-term (current) use of injectable non-insulin antidiabetic drugs: Secondary | ICD-10-CM | POA: Diagnosis not present

## 2024-01-09 DIAGNOSIS — I25119 Atherosclerotic heart disease of native coronary artery with unspecified angina pectoris: Secondary | ICD-10-CM | POA: Diagnosis not present

## 2024-01-09 DIAGNOSIS — I4891 Unspecified atrial fibrillation: Secondary | ICD-10-CM | POA: Diagnosis not present

## 2024-01-09 DIAGNOSIS — E11620 Type 2 diabetes mellitus with diabetic dermatitis: Secondary | ICD-10-CM | POA: Diagnosis not present

## 2024-01-09 DIAGNOSIS — Z008 Encounter for other general examination: Secondary | ICD-10-CM | POA: Diagnosis not present

## 2024-01-09 DIAGNOSIS — I700 Atherosclerosis of aorta: Secondary | ICD-10-CM | POA: Diagnosis not present

## 2024-01-09 DIAGNOSIS — E1142 Type 2 diabetes mellitus with diabetic polyneuropathy: Secondary | ICD-10-CM | POA: Diagnosis not present

## 2024-01-09 DIAGNOSIS — E785 Hyperlipidemia, unspecified: Secondary | ICD-10-CM | POA: Diagnosis not present

## 2024-01-09 DIAGNOSIS — N1832 Chronic kidney disease, stage 3b: Secondary | ICD-10-CM | POA: Diagnosis not present

## 2024-01-09 DIAGNOSIS — F325 Major depressive disorder, single episode, in full remission: Secondary | ICD-10-CM | POA: Diagnosis not present

## 2024-01-09 DIAGNOSIS — D6869 Other thrombophilia: Secondary | ICD-10-CM | POA: Diagnosis not present

## 2024-01-09 DIAGNOSIS — E1165 Type 2 diabetes mellitus with hyperglycemia: Secondary | ICD-10-CM | POA: Diagnosis not present

## 2024-01-09 DIAGNOSIS — Z794 Long term (current) use of insulin: Secondary | ICD-10-CM | POA: Diagnosis not present

## 2024-01-09 DIAGNOSIS — J439 Emphysema, unspecified: Secondary | ICD-10-CM | POA: Diagnosis not present

## 2024-01-11 DIAGNOSIS — J449 Chronic obstructive pulmonary disease, unspecified: Secondary | ICD-10-CM | POA: Diagnosis not present

## 2024-01-11 DIAGNOSIS — D509 Iron deficiency anemia, unspecified: Secondary | ICD-10-CM | POA: Diagnosis not present

## 2024-01-11 DIAGNOSIS — E039 Hypothyroidism, unspecified: Secondary | ICD-10-CM | POA: Diagnosis not present

## 2024-01-11 DIAGNOSIS — R32 Unspecified urinary incontinence: Secondary | ICD-10-CM | POA: Diagnosis not present

## 2024-01-11 DIAGNOSIS — E119 Type 2 diabetes mellitus without complications: Secondary | ICD-10-CM | POA: Diagnosis not present

## 2024-01-11 DIAGNOSIS — Z794 Long term (current) use of insulin: Secondary | ICD-10-CM | POA: Diagnosis not present

## 2024-01-11 DIAGNOSIS — Z7985 Long-term (current) use of injectable non-insulin antidiabetic drugs: Secondary | ICD-10-CM | POA: Diagnosis not present

## 2024-01-11 DIAGNOSIS — E559 Vitamin D deficiency, unspecified: Secondary | ICD-10-CM | POA: Diagnosis not present

## 2024-01-11 DIAGNOSIS — F0390 Unspecified dementia without behavioral disturbance: Secondary | ICD-10-CM | POA: Diagnosis not present

## 2024-01-11 DIAGNOSIS — B372 Candidiasis of skin and nail: Secondary | ICD-10-CM | POA: Diagnosis not present

## 2024-01-26 ENCOUNTER — Encounter: Payer: Medicare HMO | Admitting: Podiatry

## 2024-01-29 NOTE — Progress Notes (Signed)
Patient did not show for scheduled appointment today.

## 2024-01-31 DIAGNOSIS — E1159 Type 2 diabetes mellitus with other circulatory complications: Secondary | ICD-10-CM | POA: Diagnosis not present

## 2024-02-09 DIAGNOSIS — K5900 Constipation, unspecified: Secondary | ICD-10-CM | POA: Diagnosis not present

## 2024-02-09 DIAGNOSIS — I10 Essential (primary) hypertension: Secondary | ICD-10-CM | POA: Diagnosis not present

## 2024-02-09 DIAGNOSIS — Z0000 Encounter for general adult medical examination without abnormal findings: Secondary | ICD-10-CM | POA: Diagnosis not present

## 2024-02-09 DIAGNOSIS — E039 Hypothyroidism, unspecified: Secondary | ICD-10-CM | POA: Diagnosis not present

## 2024-02-09 DIAGNOSIS — Z1231 Encounter for screening mammogram for malignant neoplasm of breast: Secondary | ICD-10-CM | POA: Diagnosis not present

## 2024-02-09 DIAGNOSIS — E1165 Type 2 diabetes mellitus with hyperglycemia: Secondary | ICD-10-CM | POA: Diagnosis not present

## 2024-02-09 DIAGNOSIS — D509 Iron deficiency anemia, unspecified: Secondary | ICD-10-CM | POA: Diagnosis not present

## 2024-02-09 DIAGNOSIS — R195 Other fecal abnormalities: Secondary | ICD-10-CM | POA: Diagnosis not present

## 2024-02-25 ENCOUNTER — Other Ambulatory Visit: Payer: Self-pay | Admitting: Cardiology

## 2024-03-01 DIAGNOSIS — Z794 Long term (current) use of insulin: Secondary | ICD-10-CM | POA: Diagnosis not present

## 2024-03-01 DIAGNOSIS — E782 Mixed hyperlipidemia: Secondary | ICD-10-CM | POA: Diagnosis not present

## 2024-03-01 DIAGNOSIS — E1165 Type 2 diabetes mellitus with hyperglycemia: Secondary | ICD-10-CM | POA: Diagnosis not present

## 2024-03-01 DIAGNOSIS — Z833 Family history of diabetes mellitus: Secondary | ICD-10-CM | POA: Diagnosis not present

## 2024-03-01 DIAGNOSIS — I10 Essential (primary) hypertension: Secondary | ICD-10-CM | POA: Diagnosis not present

## 2024-03-02 DIAGNOSIS — E1159 Type 2 diabetes mellitus with other circulatory complications: Secondary | ICD-10-CM | POA: Diagnosis not present

## 2024-05-10 ENCOUNTER — Ambulatory Visit: Admitting: Podiatry

## 2024-05-17 ENCOUNTER — Ambulatory Visit

## 2024-05-24 ENCOUNTER — Ambulatory Visit: Admitting: Podiatry

## 2024-05-24 DIAGNOSIS — M79675 Pain in left toe(s): Secondary | ICD-10-CM

## 2024-05-24 DIAGNOSIS — M2041 Other hammer toe(s) (acquired), right foot: Secondary | ICD-10-CM | POA: Diagnosis not present

## 2024-05-24 DIAGNOSIS — B351 Tinea unguium: Secondary | ICD-10-CM

## 2024-05-24 DIAGNOSIS — M79674 Pain in right toe(s): Secondary | ICD-10-CM | POA: Diagnosis not present

## 2024-05-24 NOTE — Progress Notes (Signed)
 Subjective:  Patient ID: Tonya Richard, female    DOB: Oct 03, 1946,  MRN: [id number]  Tonya Richard presents to clinic today for:  Chief Complaint  Patient presents with   RFC     RFC with out callous. A1c was 11 in April, no anti coag.    Patient notes nails are thick, discolored, elongated and painful in shoegear when trying to ambulate.  She is asking if there is any type of multiple toe spreader available for her hammertoes.  PCP is Abran Ival ORN, FNP.  Past Medical History:  Diagnosis Date   Abnormal stress test 08/09/2017   Formatting of this note might be different from the original. Added automatically from request for surgery [phone]   Acute renal failure (HCC) 08/16/2014   Asthma    Asthma, chronic 08/16/2014   Benign essential hypertension 08/09/2017   CAD (coronary artery disease), native coronary artery    h/o CABG, stent   Chest pain 08/09/2017   Chronic coronary artery disease 04/16/2019   Formatting of this note might be different from the original. 3-Vessel   COPD (chronic obstructive pulmonary disease) (HCC) 10/13/2018   DM type 2 (diabetes mellitus, type 2) (HCC)    Enteritis due to Clostridium difficile 08/17/2014   Essential hypertension, benign 08/16/2014   GERD (gastroesophageal reflux disease)    Hx of CABG 08/09/2017   Hyperkalemia, mild 08/16/2014   Hyperlipidemia    Hypertension    Hypoglycemia 08/16/2014   Hypothyroidism    Late effect of cerebrovascular accident (CVA) 03/18/2020   Long-term use of aspirin  therapy 04/16/2019   Metabolic acidosis 08/16/2014   Mixed hyperlipidemia 08/09/2017   Morbid obesity (HCC) 08/16/2014   Myocardial infarction [facility])    PAD (peripheral artery disease) (HCC) 08/09/2017   Peripheral vascular disease (HCC) 05/12/2020   Pre-op evaluation 06/29/2016   Shortness of breath    Strep pharyngitis 08/16/2014   Type 2 diabetes mellitus with circulatory disorder, with long-term current use of insulin  (HCC) 09/06/2017   Vomiting  and diarrhea 08/16/2014   Past Surgical History:  Procedure Laterality Date   CORONARY ARTERY BYPASS GRAFT     VAGINAL HYSTERECTOMY     WRIST SURGERY     Allergies  Allergen Reactions   Atorvastatin      Other reaction(s): Other (See Comments) Muscle weakness and pain in legs    Review of Systems: Negative except as noted in the HPI.  Objective:  Tonya Richard is a pleasant 78 y.o. female in NAD. AAO x 3.  Vascular Examination: Capillary refill time is 3-5 seconds to toes bilateral. Palpable pedal pulses b/l LE. Digital hair present b/l.  Skin temperature gradient WNL b/l. No varicosities b/l. No cyanosis noted b/l.   Dermatological Examination: Pedal skin with normal turgor, texture and tone b/l. No open wounds. No interdigital macerations b/l. Toenails x10 are 3mm thick, discolored, dystrophic with subungual debris. There is pain with compression of the nail plates.  They are elongated x10  Assessment/Plan: 1. Pain due to onychomycosis of toenails of both feet   2. Hammertoe of right foot    The mycotic toenails were sharply debrided x10 with sterile nail nippers and a power debriding burr to decrease bulk/thickness and length.    She was fitted for a triple loop gel toe spreader/toe crest.  She liked how this fit and will wear this at all times weightbearing.  She will remove for sleeping and bathing.  If she is barefoot it  may be better to not wear it and only wear in close toed shoes.  It may fall off if barefoot.  Return in about 3 months (around 08/24/2024) for RFC.   Awanda CHARM Imperial, DPM, FACFAS [facility]     [address] [place], [place] [address]                Office [phone]  Fax [phone]

## 2024-05-30 ENCOUNTER — Ambulatory Visit: Payer: Self-pay

## 2024-05-30 VITALS — BP 142/72 | HR 83 | Ht 61.0 in | Wt 159.4 lb

## 2024-05-30 DIAGNOSIS — E782 Mixed hyperlipidemia: Secondary | ICD-10-CM

## 2024-05-30 DIAGNOSIS — I739 Peripheral vascular disease, unspecified: Secondary | ICD-10-CM | POA: Diagnosis not present

## 2024-05-30 DIAGNOSIS — I2510 Atherosclerotic heart disease of native coronary artery without angina pectoris: Secondary | ICD-10-CM

## 2024-05-30 DIAGNOSIS — I10 Essential (primary) hypertension: Secondary | ICD-10-CM

## 2024-05-30 MED ORDER — ASPIRIN 81 MG PO TBEC: 81.0000 mg | DELAYED_RELEASE_TABLET | Freq: Every day | ORAL | 3 refills | Status: AC

## 2024-05-30 MED ORDER — ROSUVASTATIN CALCIUM 10 MG PO TABS: 10.0000 mg | ORAL_TABLET | Freq: Every day | ORAL | 3 refills | Status: AC

## 2024-05-30 NOTE — Assessment & Plan Note (Signed)
 Nonobstructive mild carotid artery disease less than 60% on ultrasound exam from December 2023. Continue with aspirin  81 mg once daily and lipid-lowering therapy as discussed.

## 2024-05-30 NOTE — Patient Instructions (Signed)
 Medication Instructions:  Your physician has recommended you make the following change in your medication:   Start 81 mg coated aspirin  daily  Start 10 mg Rosuvastatin  daily  *If you need a refill on your cardiac medications before your next appointment, please call your pharmacy*   Lab Work: None ordered If you have labs (blood work) drawn today and your tests are completely normal, you will receive your results only by: MyChart Message (if you have MyChart) OR A paper copy in the mail If you have any lab test that is abnormal or we need to change your treatment, we will call you to review the results.   Testing/Procedures: None ordered   Follow-Up: At [facility], you and your health needs are our priority.  As part of our continuing mission to provide you with exceptional heart care, we have created designated Provider Care Teams.  These Care Teams include your primary Cardiologist (physician) and Advanced Practice Providers (APPs -  Physician Assistants and Nurse Practitioners) who all work together to provide you with the care you need, when you need it.  We recommend signing up for the patient portal called MyChart.  Sign up information is provided on this After Visit Summary.  MyChart is used to connect with patients for Virtual Visits (Telemedicine).  Patients are able to view lab/test results, encounter notes, upcoming appointments, etc.  Non-urgent messages can be sent to your provider as well.   To learn more about what you can do with MyChart, go to [email or web address].    Your next appointment:   6 month(s)  The format for your next appointment:   In Person  Provider:   Alean Kobus, MD    Other Instructions none  Important Information About Sugar

## 2024-05-30 NOTE — Assessment & Plan Note (Signed)
 Well-controlled. Target below 140/90 mmHg. Continue with Imdur  120 mg once daily and metoprolol  succinate 25 mg once daily.

## 2024-05-30 NOTE — Assessment & Plan Note (Addendum)
 Last lipid panel available to review is from November 2023 with LDL 52 and HDL 63, good control. Continue current medications Repatha  140 mg subcutaneous once every 2 weeks, Zetia  10 mg once daily. She ran out of rosuvastatin  couple weeks ago. Will prescribe rosuvastatin  10 mg once daily her prior dose.   Will obtain lipid panel results from PCP's annual healthcare visit follow-up.

## 2024-05-30 NOTE — Assessment & Plan Note (Signed)
 History of severe multivessel disease s/p CABG 2012 Last cardiac cath 2018 at [facility] noted patent LIMA-LAD and SVG-OM with mid graft 50% stenosis, occluded grafts to diagonal and distal RCA.  Occluded native LAD and LCx and unsuccessful attempts at PCI of native RCA.   On medical therapy. Good control of anginal symptoms. Functional status limited due to deconditioning and balance.  Continue with Imdur  120 mg once daily and metoprolol  succinate 25 mg once daily.  She has been out of antiplatelet therapy for the last couple weeks since she ran out of Brilinta . Resume aspirin  81 mg once daily.  Lipid-lowering therapy as discussed under hyperlipidemia.

## 2024-05-30 NOTE — Progress Notes (Signed)
 Cardiology Consultation:    Date:  05/30/2024   ID:  Tonya Richard, DOB 1946-10-07, MRN [id number]  PCP:  Marelyn Axon, MD  Cardiologist:  Alean SAUNDERS Jashan Cotten, MD   Referring MD: Abran Ival ORN, FNP   No chief complaint on file.    ASSESSMENT AND PLAN:   Tonya Richard 78 year old woman  history of CAD s/p CABG in 2012, cardiac cath 2018 at [facility] noted occluded SVG-diagonal, SVG-distal RCA, patent SVG-OM1 with mid graft stenosis 50%, patent LIMA-LAD, severe native triple-vessel disease with occluded native LAD and LCx and attempted PCI of native RCA could not be crossed with a wire, diabetes, high pretension, hyperlipidemia, CVA, COPD, GERD, hypothyroidism, peripheral arterial disease, ultrasound carotids December 2023 with less than 59% bilateral internal carotid artery stenosis.,  CKD stage IV Echocardiogram January 2023 noted LVEF 60 to 65% with dilated left atrium.   Here for follow-up visit.  Problem List Items Addressed This Visit     CAD (coronary artery disease), native coronary artery   History of severe multivessel disease s/p CABG 2012 Last cardiac cath 2018 at [facility] noted patent LIMA-LAD and SVG-OM with mid graft 50% stenosis, occluded grafts to diagonal and distal RCA.  Occluded native LAD and LCx and unsuccessful attempts at PCI of native RCA.   On medical therapy. Good control of anginal symptoms. Functional status limited due to deconditioning and balance.  Continue with Imdur  120 mg once daily and metoprolol  succinate 25 mg once daily.  She has been out of antiplatelet therapy for the last couple weeks since she ran out of Brilinta . Resume aspirin  81 mg once daily.  Lipid-lowering therapy as discussed under hyperlipidemia.      Relevant Medications   aspirin  EC 81 MG tablet   rosuvastatin  (CRESTOR ) 10 MG tablet   Mixed hyperlipidemia   Last lipid panel available to review is from November 2023 with LDL 52 and HDL 63, good  control. Continue current medications Repatha  140 mg subcutaneous once every 2 weeks, Zetia  10 mg once daily. She ran out of rosuvastatin  couple weeks ago. Will prescribe rosuvastatin  10 mg once daily her prior dose.   Will obtain lipid panel results from PCP's annual healthcare visit follow-up.       Relevant Medications   aspirin  EC 81 MG tablet   rosuvastatin  (CRESTOR ) 10 MG tablet   PAD (peripheral artery disease) (HCC) - Primary   Nonobstructive mild carotid artery disease less than 60% on ultrasound exam from December 2023. Continue with aspirin  81 mg once daily and lipid-lowering therapy as discussed.       Relevant Medications   aspirin  EC 81 MG tablet   rosuvastatin  (CRESTOR ) 10 MG tablet   Benign essential hypertension   Well-controlled. Target below 140/90 mmHg. Continue with Imdur  120 mg once daily and metoprolol  succinate 25 mg once daily.       Relevant Medications   aspirin  EC 81 MG tablet   rosuvastatin  (CRESTOR ) 10 MG tablet   Other Relevant Orders   EKG 12-Lead (Completed)   Chronic kidney disease stage IV, pending follow-up visit with nephrologist. Mentions she had made of mind did not pursue dialysis if kidney function were to deteriorate.  Return to clinic for follow-up with cardiology in 6 months.   History of Present Illness:    Tonya Richard is a 78 y.o. female who is being seen today for well visit. PCP is Abran Ival ORN, FNP. Last visit at our office was with Dr. Bernie 06/15/2023.  Pleasant woman here for the visit today accompanied by her daughter.  Her daughter helps her manage medications and keep up with her health care visits.  She currently lives with her grandson and other family members checking on her.  Over time she missed appointments with healthcare providers but now her daughter is back taking care of her appointments and is in the process of setting up follow-up visits with healthcare providers.  Has history of CAD s/p CABG  in 2012, cardiac cath 2018 at [facility] noted occluded SVG-diagonal, SVG-distal RCA, patent SVG-OM1 with mid graft stenosis 50%, patent LIMA-LAD, severe native triple-vessel disease with occluded native LAD and LCx and attempted PCI of native RCA could not be crossed with a wire, diabetes, high pretension, hyperlipidemia, CVA, COPD, GERD, hypothyroidism, peripheral arterial disease, ultrasound carotids December 2023 with less than 59% bilateral internal carotid artery stenosis.,  CKD stage IV Echocardiogram January 2023 noted LVEF 60 to 65% with dilated left atrium.  Relates using walker to help with balance and avoid falls. Denies any symptoms of chest pain, shortness of breath, orthopnea paroxysmal nocturnal dyspnea no pedal edema.  No palpitations, lightheadedness, dizziness.  No syncopal episodes. Denies any recent falls.  Denies any blood in urine or stools.  Good compliance with medications however she ran out of Brilinta  about 2 weeks ago and has had no refills and not taking any aspirin  at this time. Similarly also ran out of rosuvastatin  couple weeks ago.  EKG in the clinic today shows sinus rhythm heart rate 83/min, PR interval 146 ms, QRS duration 82 ms, QTc 441 ms.  Anterior Q waves.  Last blood work at PCPs office was about 3 months ago, per her daughter, results not available. Last lipid panel reviewed from November 2023 with total cholesterol 132, triglycerides 93, HDL 63 and LDL 52 on [facility] data. CMP at the time BUN 22, creatinine 1.81, EGFR 29, normal transaminases and alkaline phosphatase.   Past Medical History:  Diagnosis Date   Abnormal stress test 08/09/2017   Formatting of this note might be different from the original. Added automatically from request for surgery [phone]   Acute renal failure (HCC) 08/16/2014   Asthma    Asthma, chronic 08/16/2014   Benign essential hypertension 08/09/2017   CAD (coronary artery disease), native coronary artery    h/o CABG, stent    Chest pain 08/09/2017   Chronic coronary artery disease 04/16/2019   Formatting of this note might be different from the original. 3-Vessel   COPD (chronic obstructive pulmonary disease) (HCC) 10/13/2018   DM type 2 (diabetes mellitus, type 2) (HCC)    Enteritis due to Clostridium difficile 08/17/2014   Essential hypertension, benign 08/16/2014   GERD (gastroesophageal reflux disease)    Hx of CABG 08/09/2017   Hyperkalemia, mild 08/16/2014   Hyperlipidemia    Hypertension    Hypoglycemia 08/16/2014   Hypothyroidism    Late effect of cerebrovascular accident (CVA) 03/18/2020   Long-term use of aspirin  therapy 04/16/2019   Metabolic acidosis 08/16/2014   Mixed hyperlipidemia 08/09/2017   Morbid obesity (HCC) 08/16/2014   Myocardial infarction [facility])    PAD (peripheral artery disease) (HCC) 08/09/2017   Peripheral vascular disease (HCC) 05/12/2020   Pre-op evaluation 06/29/2016   Shortness of breath    Strep pharyngitis 08/16/2014   Type 2 diabetes mellitus with circulatory disorder, with long-term current use of insulin  (HCC) 09/06/2017   Vomiting and diarrhea 08/16/2014    Past Surgical History:  Procedure Laterality Date  CORONARY ARTERY BYPASS GRAFT     VAGINAL HYSTERECTOMY     WRIST SURGERY      Current Medications: Current Meds  Medication Sig   aspirin  EC 81 MG tablet Take 1 tablet (81 mg total) by mouth daily. Swallow whole.   BRILINTA  90 MG TABS tablet Take 1 tablet (90 mg total) by mouth 2 (two) times daily.   budesonide-formoterol (SYMBICORT) 160-4.5 MCG/ACT inhaler Inhale 2 puffs into the lungs 2 (two) times daily.   docusate sodium (COLACE) 100 MG capsule Take 100 mg by mouth 2 (two) times daily as needed.   Evolocumab  (REPATHA  SURECLICK) 140 MG/ML SOAJ Inject 140 mg into the skin every 14 (fourteen) days.   ezetimibe  (ZETIA ) 10 MG tablet Take 10 mg by mouth daily.   ferrous sulfate 325 (65 FE) MG tablet Take 325 mg by mouth daily with breakfast.   hydrocortisone (ANUSOL-HC)  2.5 % rectal cream Place 1 Application rectally 2 (two) times daily.   insulin  aspart (NOVOLOG ) 100 UNIT/ML injection Inject 0-20 Units into the skin 3 (three) times daily with meals.   Insulin  Glargine w/ Trans Port (BASAGLAR  TEMPO PEN) 100 UNIT/ML SOPN Inject 70 Units into the skin in the morning.   ipratropium-albuterol  (DUONEB) 0.5-2.5 (3) MG/3ML SOLN Take 3 mLs by nebulization 4 (four) times daily as needed (shortness of breath/wheezing).   isosorbide  mononitrate (IMDUR ) 120 MG 24 hr tablet TAKE 1 TABLET(120 MG) BY MOUTH DAILY   levothyroxine  (SYNTHROID , LEVOTHROID) 50 MCG tablet Take 50 mcg by mouth daily before breakfast.   metoprolol  succinate (TOPROL -XL) 25 MG 24 hr tablet Take 3 tablets (75 mg total) by mouth daily. Take with or immediately following a meal.   multivitamin (RENA-VIT) TABS tablet Take 1 tablet by mouth at bedtime.   nitroGLYCERIN  (NITROSTAT ) 0.4 MG SL tablet Place 1 tablet (0.4 mg total) under the tongue every 5 (five) minutes as needed for chest pain.   nystatin cream (MYCOSTATIN) Apply 1 Application topically 2 (two) times daily.   OZEMPIC, 0.25 OR 0.5 MG/DOSE, 2 MG/3ML SOPN Inject 0.5 mg into the skin once a week.   pantoprazole  (PROTONIX ) 40 MG tablet Take 40 mg by mouth daily.   polyethylene glycol powder (GLYCOLAX /MIRALAX ) 17 GM/SCOOP powder Take 17 g by mouth daily as needed for constipation.   tamsulosin  (FLOMAX ) 0.4 MG CAPS capsule Take 0.4 mg by mouth daily after breakfast.   trimethoprim (TRIMPEX) 100 MG tablet Take 100 mg by mouth daily.   [DISCONTINUED] rosuvastatin  (CRESTOR ) 10 MG tablet Take 1 tablet (10 mg total) by mouth daily.     Allergies:   Atorvastatin    Social History   Socioeconomic History   Marital status: Widowed    Spouse name: Not on file   Number of children: Not on file   Years of education: Not on file   Highest education level: Not on file  Occupational History   Not on file  Tobacco Use   Smoking status: Former   Smokeless  tobacco: Current    Types: Snuff  Substance and Sexual Activity   Alcohol use: No   Drug use: No   Sexual activity: Not on file  Other Topics Concern   Not on file  Social History Narrative   Not on file   Social Drivers of Health   Financial Resource Strain: Not on file  Food Insecurity: Low Risk  (03/01/2024)   Received from [facility]   Hunger Vital Sign    Within the past 12 months, you worried  that your food would run out before you got money to buy more: Never true    Within the past 12 months, the food you bought just didn't last and you didn't have money to get more. : Never true  Transportation Needs: No Transportation Needs (03/01/2024)   Received from [facility]    In the past 12 months, has lack of reliable transportation kept you from medical appointments, meetings, work or from getting things needed for daily living? : No  Physical Activity: Not on file  Stress: Not on file  Social Connections: Not on file     Family History: The patient's family history includes Diabetes in her father; Heart disease in her brother and father. ROS:   Please see the history of present illness.    All 14 point review of systems negative except as described per history of present illness.  EKGs/Labs/Other Studies Reviewed:    The following studies were reviewed today:  Cardiac Diagnostic + Interventional Report   Demographics   Patient     MARSHAL SCHRECENGOST Date of     July 06, 1946  Height   Name        I            Birth   Patient     [id number]      Age         19 year(s)  Weight   Number   Visit       [id number]  Gender      Female      BSA   Number   Accession   [id number]    Race        Unknown     BMI   Number                            Room Number [facility]    Date of Study  08/11/2017   Referring   Darryl       Diagnostic  Darryl      Interventional Darryl   Physician   Wava, MD    Physician   Wava, MD   Physician      Wava, MD  Procedure  Procedure  Type   Diagnostic procedure:   PCI procedure: PTCA  Complications: No Complications.  Conclusions  Diagnostic Summary  No significant change from 2005.  Native LAD and Cx occluded.  Mid RCA sub-total with prob recanalization.  LIMA patent.  50% lesion in SVG to OM.  SVG to Dia and to RCA occluded.  Normal LV function.  Diagnostic Recommendations  Attempt PCI of native RCA.  Interventional Summary  Unable to cross native RCA with wire.  Interventional Recommendations  Medical therapy.  Could attempt RCA again with bigger guide-wire from the leg.     EKG:  EKG Interpretation Date/Time:  Wednesday May 30 2024 10:25:58 EDT Ventricular Rate:  83 PR Interval:  146 QRS Duration:  82 QT Interval:  376 QTC Calculation: 441 R Axis:   -15  Text Interpretation: Normal sinus rhythm Possible Anterior infarct (cited on or before 15-Jun-2023) When compared with ECG of 15-Jun-2023 09:51, No significant change was found Confirmed by Liborio Hai reddy [phone]) on 05/30/2024 10:32:54 AM    Recent Labs: No results found for requested labs within last 365 days.  Recent Lipid Panel No results found for: CHOL, TRIG, HDL, CHOLHDL, VLDL, LDLCALC, LDLDIRECT  Physical Exam:    VS:  BP (!) 142/72 (BP Location: Left Arm)   Pulse 83   Ht 5' 1 (1.549 m)   Wt 159 lb 6.4 oz (72.3 kg)   SpO2 95%   BMI 30.12 kg/m     Wt Readings from Last 3 Encounters:  05/30/24 159 lb 6.4 oz (72.3 kg)  06/15/23 155 lb 12.8 oz (70.7 kg)  10/28/22 165 lb (74.8 kg)     GENERAL:  Well nourished, well developed in no acute distress NECK: No JVD; No carotid bruits CARDIAC: RRR, S1 and S2 present, no murmurs, no rubs, no gallops CHEST:  Clear to auscultation without rales, wheezing or rhonchi  Extremities: No pitting pedal edema. Pulses bilaterally symmetric with radial 2+ and dorsalis pedis 2+ NEUROLOGIC:  Alert and oriented x 3  Medication Adjustments/Labs and Tests Ordered: Current  medicines are reviewed at length with the patient today.  Concerns regarding medicines are outlined above.  Orders Placed This Encounter  Procedures   EKG 12-Lead   Meds ordered this encounter  Medications   aspirin  EC 81 MG tablet    Sig: Take 1 tablet (81 mg total) by mouth daily. Swallow whole.    Dispense:  90 tablet    Refill:  3   rosuvastatin  (CRESTOR ) 10 MG tablet    Sig: Take 1 tablet (10 mg total) by mouth daily.    Dispense:  90 tablet    Refill:  3    Signed, Marq Rebello reddy Elina Streng, MD, MPH, [facility]. 05/30/2024 11:07 AM    [facility]

## 2024-08-23 ENCOUNTER — Ambulatory Visit: Admitting: Podiatry

## 2024-08-23 DIAGNOSIS — M79674 Pain in right toe(s): Secondary | ICD-10-CM | POA: Diagnosis not present

## 2024-08-23 DIAGNOSIS — B351 Tinea unguium: Secondary | ICD-10-CM | POA: Diagnosis not present

## 2024-08-23 DIAGNOSIS — M79675 Pain in left toe(s): Secondary | ICD-10-CM

## 2024-08-23 NOTE — Progress Notes (Signed)
 Subjective:  Patient ID: Montel LILLETTE Richer, female    DOB: 02/10/1946,  MRN: [id number]  Margarite I Mulligan presents to clinic today for:  Chief Complaint  Patient presents with   [facility]    [facility], no callous but does have very dry skin on the heels.  A1c 10.8 June, pt reported ASA   Patient notes nails are thick, discolored, elongated and painful in shoegear when trying to ambulate.   PCP is Marelyn Axon, MD.  Past Medical History:  Diagnosis Date   Abnormal stress test 08/09/2017   Formatting of this note might be different from the original. Added automatically from request for surgery [phone]   Acute renal failure 08/16/2014   Asthma    Asthma, chronic 08/16/2014   Benign essential hypertension 08/09/2017   CAD (coronary artery disease), native coronary artery    h/o CABG, stent   Chest pain 08/09/2017   Chronic coronary artery disease 04/16/2019   Formatting of this note might be different from the original. 3-Vessel   COPD (chronic obstructive pulmonary disease) (HCC) 10/13/2018   DM type 2 (diabetes mellitus, type 2) (HCC)    Enteritis due to Clostridium difficile 08/17/2014   Essential hypertension, benign 08/16/2014   GERD (gastroesophageal reflux disease)    Hx of CABG 08/09/2017   Hyperkalemia, mild 08/16/2014   Hyperlipidemia    Hypertension    Hypoglycemia 08/16/2014   Hypothyroidism    Late effect of cerebrovascular accident (CVA) 03/18/2020   Long-term use of aspirin  therapy 04/16/2019   Metabolic acidosis 08/16/2014   Mixed hyperlipidemia 08/09/2017   Morbid obesity (HCC) 08/16/2014   Myocardial infarction [facility])    PAD (peripheral artery disease) 08/09/2017   Peripheral vascular disease 05/12/2020   Pre-op evaluation 06/29/2016   Shortness of breath    Strep pharyngitis 08/16/2014   Type 2 diabetes mellitus with circulatory disorder, with long-term current use of insulin  (HCC) 09/06/2017   Vomiting and diarrhea 08/16/2014   Past Surgical History:  Procedure Laterality Date    CORONARY ARTERY BYPASS GRAFT     VAGINAL HYSTERECTOMY     WRIST SURGERY     Allergies  Allergen Reactions   Atorvastatin      Other reaction(s): Other (See Comments) Muscle weakness and pain in legs    Review of Systems: Negative except as noted in the HPI.  Objective:  Tona I Lawrie is a pleasant 78 y.o. female in NAD. AAO x 3.  Vascular Examination: Capillary refill time is 3-5 seconds to toes bilateral. Palpable pedal pulses b/l LE. Digital hair present b/l.  Skin temperature gradient WNL b/l. No varicosities b/l. No cyanosis noted b/l.   Dermatological Examination: Pedal skin with normal turgor, texture and tone b/l. No open wounds. No interdigital macerations b/l. Toenails x10 are 3mm thick, discolored, dystrophic with subungual debris. There is pain with compression of the nail plates.  They are elongated x10  Assessment/Plan: 1. Pain due to onychomycosis of toenails of both feet    The mycotic toenails were sharply debrided x10 with sterile nail nippers and a power debriding burr to decrease bulk/thickness and length.    Return in about 3 months (around 11/22/2024) for [facility].   Awanda CHARM Imperial, DPM, FACFAS [facility]     [address]  [place], [place] [address]                Office [phone]  Fax [phone]

## 2024-09-16 ENCOUNTER — Other Ambulatory Visit: Payer: Self-pay | Admitting: Cardiology

## 2024-09-16 DIAGNOSIS — E782 Mixed hyperlipidemia: Secondary | ICD-10-CM

## 2024-09-16 DIAGNOSIS — I739 Peripheral vascular disease, unspecified: Secondary | ICD-10-CM

## 2024-09-16 DIAGNOSIS — I2510 Atherosclerotic heart disease of native coronary artery without angina pectoris: Secondary | ICD-10-CM

## 2024-09-24 ENCOUNTER — Telehealth: Payer: Self-pay

## 2024-09-24 ENCOUNTER — Other Ambulatory Visit (HOSPITAL_COMMUNITY): Payer: Self-pay

## 2024-09-24 DIAGNOSIS — E782 Mixed hyperlipidemia: Secondary | ICD-10-CM

## 2024-09-24 NOTE — Telephone Encounter (Signed)
 Pharmacy Patient Advocate Encounter   Received notification from Patient Pharmacy that prior authorization for REPATHA  is required/requested.   Insurance verification completed.   The patient is insured through [facility].   Per test claim: PA required; PA started via CoverMyMeds. KEY [id number] . Please see clinical question(s) below that I am not finding the answer to in their chart and advise.  Plan requires LDL labs within the last 120 days, please advise.

## 2024-09-25 NOTE — Telephone Encounter (Signed)
 Left message for pt to return call. Pt needs fasting lipids as it has been over 120 days.

## 2024-09-25 NOTE — Telephone Encounter (Signed)
 Labs requested from PCP as pt had labs done recently.

## 2024-09-26 ENCOUNTER — Telehealth: Payer: Self-pay | Admitting: Cardiology

## 2024-09-26 NOTE — Telephone Encounter (Signed)
 Please see separate encounter regarding PA. We still need labs.

## 2024-09-26 NOTE — Telephone Encounter (Signed)
 Tonya Richard with [facility] calling with prior authorization for Repatha . They have faxed multiple times and are asking for receipt confirmation. Please advise.   [phone]

## 2024-09-28 ENCOUNTER — Telehealth: Payer: Self-pay | Admitting: Cardiology

## 2024-09-28 NOTE — Telephone Encounter (Signed)
 Spoke with pts daughter. Advised that pt will need a lipid panel to get PA Repatha . She stated they would come next week

## 2024-09-28 NOTE — Telephone Encounter (Signed)
Pt c/o medication issue:  1. Name of Medication:   Evolocumab (REPATHA SURECLICK) 140 MG/ML SOAJ    2. How are you currently taking this medication (dosage and times per day)?    3. Are you having a reaction (difficulty breathing--STAT)? no  4. What is your medication issue? Prior Berkley Harvey is needed for medication. Please advise

## 2024-10-01 NOTE — Telephone Encounter (Signed)
 PA case has expired and can no longer be submitted under this KEY. New request will need to be initiated if/when LDL labs are updated.

## 2024-11-21 ENCOUNTER — Ambulatory Visit: Admitting: Podiatry

## 2024-11-30 ENCOUNTER — Ambulatory Visit

## 2024-11-30 VITALS — BP 148/84 | HR 72 | Ht 61.0 in | Wt 154.4 lb

## 2024-11-30 DIAGNOSIS — I2510 Atherosclerotic heart disease of native coronary artery without angina pectoris: Secondary | ICD-10-CM | POA: Diagnosis not present

## 2024-11-30 DIAGNOSIS — E782 Mixed hyperlipidemia: Secondary | ICD-10-CM

## 2024-11-30 DIAGNOSIS — I10 Essential (primary) hypertension: Secondary | ICD-10-CM

## 2024-11-30 MED ORDER — REPATHA SURECLICK 140 MG/ML ~~LOC~~ SOAJ: 140.0000 mg | SUBCUTANEOUS | 3 refills | Status: DC

## 2024-11-30 NOTE — Patient Instructions (Signed)

## 2024-11-30 NOTE — Assessment & Plan Note (Signed)
 Suboptimal today. Recommended to monitor blood pressure closely at home. Target blood pressure below 140/80 mmHg. Continue Imdur  120 mg once daily Continue metoprolol  XL 100 mg once daily. If blood pressure suboptimal will add low-dose amlodipine.

## 2024-11-30 NOTE — Assessment & Plan Note (Signed)
 No recent lipid panel. Will obtain lipid panel today. Continue her medications rosuvastatin  10 mg once daily Zetia  10 mg once daily Repatha  140 mg subcutaneous injection once every 14 days. Target LDL less than 55 mg/dL.

## 2024-11-30 NOTE — Progress Notes (Signed)
 "  Cardiology Consultation:    Date:  11/30/2024   ID:  Tonya Richard, DOB [birth year], MRN [id number]  PCP:  Marelyn Axon, MD  Cardiologist:  Alean SAUNDERS Erionna Strum, MD   Referring MD: Marelyn Axon, MD   No chief complaint on file.    ASSESSMENT AND PLAN:   [name] 79 year old woman with history of CAD s/p CABG in 2012, cardiac cath 2018 at [facility] noted occluded SVG-diagonal, SVG-distal RCA, patent SVG-OM1 with mid graft stenosis 50%, patent LIMA-LAD, severe native triple-vessel disease with occluded native LAD and LCx and attempted PCI of native RCA could not be crossed with a wire, diabetes, high pretension, hyperlipidemia, CVA, COPD, GERD, hypothyroidism, peripheral arterial disease, ultrasound carotids December 2023 with less than 59% bilateral internal carotid artery stenosis., CKD stage IV    Here for follow-up visit.  Problem List Items Addressed This Visit       Cardiovascular and Mediastinum   CAD (coronary artery disease), native coronary artery   History of severe multivessel disease s/p CABG 2012 Last cardiac cath 2018 at [facility] noted patent LIMA-LAD and SVG-OM with mid graft 50% stenosis, occluded grafts to diagonal and distal RCA.  Occluded native LAD and LCx and unsuccessful attempts at PCI of native RCA.  Continue medical therapy. Good control of her anginal symptoms. Functional status limited due to poor balance and deconditioning.  Continue aspirin  81 mg once daily. Continue Imdur  120 mg once daily Continue metoprolol  XL 100 mg once daily Continue lipid-lowering therapy as reviewed under hyperlipidemia.       Relevant Medications   metoprolol  succinate (TOPROL -XL) 100 MG 24 hr tablet   Evolocumab  (REPATHA  SURECLICK) 140 MG/ML SOAJ   Benign essential hypertension - Primary   Suboptimal today. Recommended to monitor blood pressure closely at home. Target blood pressure below 140/80 mmHg. Continue Imdur  120 mg once daily Continue  metoprolol  XL 100 mg once daily. If blood pressure suboptimal will add low-dose amlodipine.      Relevant Medications   metoprolol  succinate (TOPROL -XL) 100 MG 24 hr tablet   Evolocumab  (REPATHA  SURECLICK) 140 MG/ML SOAJ     Other   Mixed hyperlipidemia   No recent lipid panel. Will obtain lipid panel today. Continue her medications rosuvastatin  10 mg once daily Zetia  10 mg once daily Repatha  140 mg subcutaneous injection once every 14 days. Target LDL less than 55 mg/dL.      Relevant Medications   metoprolol  succinate (TOPROL -XL) 100 MG 24 hr tablet   Evolocumab  (REPATHA  SURECLICK) 140 MG/ML SOAJ   Return to clinic tentatively 6 months.   History of Present Illness:    Tonya Richard is a 79 y.o. female who is being seen today for follow-up visit. PCP is Tonya, Axon, MD. Last visit with me in the office was 05/30/2024.  Pleasant woman here for the visit today accompanied by her daughter. Her daughter helps her manage medications and keep up with her health care visits. She currently lives with her grandson and other family members checking on her.  Here for the visit today accompanied by her daughter.  Has history of CAD s/p CABG in 2012, cardiac cath 2018 at [facility] noted occluded SVG-diagonal, SVG-distal RCA, patent SVG-OM1 with mid graft stenosis 50%, patent LIMA-LAD, severe native triple-vessel disease with occluded native LAD and LCx and attempted PCI of native RCA could not be crossed with a wire, diabetes, high pretension, hyperlipidemia, CVA, COPD, GERD, hypothyroidism, peripheral arterial disease, ultrasound carotids December 2023 with less  than 59% bilateral internal carotid artery stenosis., CKD stage IV   Overall doing well at her baseline. Uses a walker to ambulate in the house.  For short distances able to cruise.  Sustained a mechanical fall about a week ago bruising her left knee.  Denies any chest pain, shortness of breath, palpitations.  No  lightheadedness, dizziness.  No syncopal episodes.  Good compliance with her medications.  No recent lipid panel to review.  When his blood pressures at home ranging from 110s to 160s and diastolic 50s to 80s.  Past Medical History:  Diagnosis Date   Abnormal stress test 08/09/2017   Formatting of this note might be different from the original. Added automatically from request for surgery [id number]   Acute delirium 06/20/2021   Acute renal failure 08/16/2014   AKI (acute kidney injury) 12/11/2020   Asthma    Asthma, chronic 08/16/2014   Benign essential hypertension 08/09/2017   CAD (coronary artery disease), native coronary artery    h/o CABG, stent   Chest pain 08/09/2017   Chronic coronary artery disease 04/16/2019   Formatting of this note might be different from the original. 3-Vessel   COPD (chronic obstructive pulmonary disease) (HCC) 10/13/2018   DM type 2 (diabetes mellitus, type 2) (HCC)    Enteritis due to Clostridium difficile 08/17/2014   Essential hypertension, benign 08/16/2014   GERD (gastroesophageal reflux disease)    Hx of CABG 08/09/2017   Hyperkalemia, mild 08/16/2014   Hyperlipidemia    Hyperosmolar hyperglycemic state (HHS) (HCC) 06/20/2021   Hyperosmolar non-ketotic state due to type 2 diabetes mellitus (HCC) 06/20/2021   Hypertension    Hypoglycemia 08/16/2014   Hypothyroidism    Late effect of cerebrovascular accident (CVA) 03/18/2020   Long-term use of aspirin  therapy 04/16/2019   Malnutrition of moderate degree 06/23/2021   Metabolic acidosis 08/16/2014   Mixed hyperlipidemia 08/09/2017   Morbid obesity (HCC) 08/16/2014   Myocardial infarction [facility])    PAD (peripheral artery disease) 08/09/2017   Peripheral vascular disease 05/12/2020   Pre-op evaluation 06/29/2016   Shortness of breath    Strep pharyngitis 08/16/2014   Type 2 diabetes mellitus with circulatory disorder, with long-term current use of insulin  (HCC) 09/06/2017   Vomiting and  diarrhea 08/16/2014    Past Surgical History:  Procedure Laterality Date   CORONARY ARTERY BYPASS GRAFT     VAGINAL HYSTERECTOMY     WRIST SURGERY      Current Medications: Active Medications[1]   Allergies:   Atorvastatin    Social History   Socioeconomic History   Marital status: Widowed    Spouse name: Not on file   Number of children: Not on file   Years of education: Not on file   Highest education level: Not on file  Occupational History   Not on file  Tobacco Use   Smoking status: Former   Smokeless tobacco: Current    Types: Snuff  Substance and Sexual Activity   Alcohol use: No   Drug use: No   Sexual activity: Not on file  Other Topics Concern   Not on file  Social History Narrative   Not on file   Social Drivers of Health   Tobacco Use: High Risk (11/30/2024)   Patient History    Smoking Tobacco Use: Former    Smokeless Tobacco Use: Current    Passive Exposure: Not on Actuary Strain: Not on file  Food Insecurity: Low Risk (03/01/2024)   Received from [facility]  Epic    Within the past 12 months, you worried that your food would run out before you got money to buy more: Never true    Within the past 12 months, the food you bought just didn't last and you didn't have money to get more. : Never true  Transportation Needs: No Transportation Needs (03/01/2024)   Received from [facility]    In the past 12 months, has lack of reliable transportation kept you from medical appointments, meetings, work or from getting things needed for daily living? : No  Physical Activity: Not on file  Stress: Not on file  Social Connections: Not on file  Depression (EYV7-0): Not on file  Alcohol Screen: Not on file  Housing: Low Risk (03/01/2024)   Received from [facility]   Epic    What is your living situation today?: I have a steady place to live    Think about the place you live. Do you have problems with any of the following?  Choose all that apply:: None/None on this list  Utilities: Low Risk (03/01/2024)   Received from [facility]   Utilities    In the past 12 months has the electric, gas, oil, or water  company threatened to shut off services in your home? : No  Health Literacy: Not on file     Family History: The patient's family history includes Diabetes in her father; Heart disease in her brother and father. ROS:   Please see the history of present illness.    All 14 point review of systems negative except as described per history of present illness.  EKGs/Labs/Other Studies Reviewed:    The following studies were reviewed today:   EKG:       Recent Labs: No results found for requested labs within last 365 days.  Recent Lipid Panel No results found for: CHOL, TRIG, HDL, CHOLHDL, VLDL, LDLCALC, LDLDIRECT  Physical Exam:    VS:  BP (!) 178/90   Pulse 72   Ht 5' 1 (1.549 m)   Wt 154 lb 6 oz (70 kg)   SpO2 97%   BMI 29.17 kg/m     Wt Readings from Last 3 Encounters:  11/30/24 154 lb 6 oz (70 kg)  05/30/24 159 lb 6.4 oz (72.3 kg)  06/15/23 155 lb 12.8 oz (70.7 kg)     GENERAL:  Well nourished, well developed in no acute distress NECK: No JVD; No carotid bruits CARDIAC: RRR, S1 and S2 present, no murmurs, no rubs, no gallops CHEST:  Clear to auscultation without rales, wheezing or rhonchi  Extremities: No pitting pedal edema. Pulses bilaterally symmetric with radial 2+ and dorsalis pedis 2+.  Mild superficial scrape that has scabbed up and bruise over the left knee. NEUROLOGIC:  Alert and oriented x 3  Medication Adjustments/Labs and Tests Ordered: Current medicines are reviewed at length with the patient today.  Concerns regarding medicines are outlined above.  No orders of the defined types were placed in this encounter.  Meds ordered this encounter  Medications   Evolocumab  (REPATHA  SURECLICK) 140 MG/ML SOAJ    Sig: Inject 140 mg into the skin every 14 (fourteen)  days.    Dispense:  6 mL    Refill:  3    Signed, Xia Stohr reddy Shiloh Southern, MD, MPH, [facility]. 11/30/2024 9:26 AM    [facility]    [1]  Current Meds  Medication Sig   aspirin  EC 81 MG tablet Take 1  tablet (81 mg total) by mouth daily. Swallow whole.   budesonide-formoterol (SYMBICORT) 160-4.5 MCG/ACT inhaler Inhale 2 puffs into the lungs 2 (two) times daily.   docusate sodium (COLACE) 100 MG capsule Take 100 mg by mouth 2 (two) times daily as needed.   ezetimibe  (ZETIA ) 10 MG tablet Take 10 mg by mouth daily.   ferrous sulfate 325 (65 FE) MG tablet Take 325 mg by mouth daily with breakfast.   hydrocortisone (ANUSOL-HC) 2.5 % rectal cream Place 1 Application rectally as needed.   insulin  aspart (NOVOLOG ) 100 UNIT/ML injection Inject 0-20 Units into the skin 3 (three) times daily with meals.   insulin  aspart (NOVOLOG ) 100 UNIT/ML injection Inject 0-5 Units into the skin at bedtime.   ipratropium-albuterol  (DUONEB) 0.5-2.5 (3) MG/3ML SOLN Take 3 mLs by nebulization 4 (four) times daily as needed (shortness of breath/wheezing).   isosorbide  mononitrate (IMDUR ) 120 MG 24 hr tablet TAKE 1 TABLET(120 MG) BY MOUTH DAILY   levothyroxine  (SYNTHROID , LEVOTHROID) 50 MCG tablet Take 50 mcg by mouth daily before breakfast.   metoprolol  succinate (TOPROL -XL) 100 MG 24 hr tablet Take 100 mg by mouth daily.   nitroGLYCERIN  (NITROSTAT ) 0.4 MG SL tablet Place 1 tablet (0.4 mg total) under the tongue every 5 (five) minutes as needed for chest pain.   OZEMPIC, 0.25 OR 0.5 MG/DOSE, 2 MG/3ML SOPN Inject 0.5 mg into the skin once a week.   pantoprazole  (PROTONIX ) 40 MG tablet Take 40 mg by mouth daily.   polyethylene glycol powder (GLYCOLAX /MIRALAX ) 17 GM/SCOOP powder Take 17 g by mouth daily as needed for constipation.   rosuvastatin  (CRESTOR ) 10 MG tablet Take 1 tablet (10 mg total) by mouth daily.   solifenacin (VESICARE) 5 MG tablet Take 5 mg by mouth daily.   [DISCONTINUED] BRILINTA   90 MG TABS tablet Take 1 tablet (90 mg total) by mouth 2 (two) times daily.   [DISCONTINUED] Evolocumab  (REPATHA  SURECLICK) 140 MG/ML SOAJ ADMINISTER 1 ML UNDER THE SKIN EVERY 14 DAYS   "

## 2024-11-30 NOTE — Assessment & Plan Note (Signed)
 History of severe multivessel disease s/p CABG 2012 Last cardiac cath 2018 at [facility] noted patent LIMA-LAD and SVG-OM with mid graft 50% stenosis, occluded grafts to diagonal and distal RCA.  Occluded native LAD and LCx and unsuccessful attempts at PCI of native RCA.  Continue medical therapy. Good control of Tonya Richard anginal symptoms. Functional status limited due to poor balance and deconditioning.  Continue aspirin  81 mg once daily. Continue Imdur  120 mg once daily Continue metoprolol  XL 100 mg once daily Continue lipid-lowering therapy as reviewed under hyperlipidemia.

## 2024-12-01 LAB — LIPID PANEL
Chol/HDL Ratio: 3.2 ratio (ref 0.0–4.4)
Cholesterol, Total: 139 mg/dL (ref 100–199)
HDL: 44 mg/dL
LDL Chol Calc (NIH): 63 mg/dL (ref 0–99)
Triglycerides: 192 mg/dL — ABNORMAL HIGH (ref 0–149)
VLDL Cholesterol Cal: 32 mg/dL (ref 5–40)

## 2024-12-01 LAB — COMPREHENSIVE METABOLIC PANEL WITH GFR
ALT: 24 IU/L (ref 0–32)
AST: 21 IU/L (ref 0–40)
Albumin: 3.9 g/dL (ref 3.8–4.8)
Alkaline Phosphatase: 138 IU/L — ABNORMAL HIGH (ref 49–135)
BUN/Creatinine Ratio: 18 (ref 12–28)
BUN: 29 mg/dL — ABNORMAL HIGH (ref 8–27)
Bilirubin Total: <0.2 mg/dL (ref 0.0–1.2)
CO2: 20 mmol/L (ref 20–29)
Calcium: 10.2 mg/dL (ref 8.7–10.3)
Chloride: 106 mmol/L (ref 96–106)
Creatinine, Ser: 1.64 mg/dL — ABNORMAL HIGH (ref 0.57–1.00)
Globulin, Total: 2.4 g/dL (ref 1.5–4.5)
Glucose: 170 mg/dL — ABNORMAL HIGH (ref 70–99)
Potassium: 4.6 mmol/L (ref 3.5–5.2)
Sodium: 141 mmol/L (ref 134–144)
Total Protein: 6.3 g/dL (ref 6.0–8.5)
eGFR: 32 mL/min/1.73 — ABNORMAL LOW

## 2024-12-04 ENCOUNTER — Other Ambulatory Visit: Payer: Self-pay | Admitting: Cardiology

## 2024-12-11 ENCOUNTER — Telehealth: Payer: Self-pay

## 2024-12-11 DIAGNOSIS — E782 Mixed hyperlipidemia: Secondary | ICD-10-CM

## 2024-12-11 DIAGNOSIS — I2510 Atherosclerotic heart disease of native coronary artery without angina pectoris: Secondary | ICD-10-CM

## 2024-12-11 NOTE — Telephone Encounter (Signed)
" °*  STAT* If patient is at the pharmacy, call can be transferred to refill team.   1. Which medications need to be refilled? (please list name of each medication and dose if known)   Evolocumab  (REPATHA  SURECLICK) 140 MG/ML SOAJ     2. Would you like to learn more about the convenience, safety, & potential cost savings by using the [facility]?   3. Are you open to using the [facility] (Type [facility].   4. Which pharmacy/location (including street and city if local pharmacy) is medication to be sent to?  [facility] [phone] - [place], [place] - [address]       5. Do they need a 30 day or 90 day supply? 90   PT is out of meds /Prescription was sent 11/30/24 to the wrong pharmacy  "

## 2024-12-13 MED ORDER — REPATHA SURECLICK 140 MG/ML ~~LOC~~ SOAJ: 140.0000 mg | SUBCUTANEOUS | 3 refills | Status: AC

## 2024-12-24 ENCOUNTER — Ambulatory Visit: Admitting: Podiatry

## 2025-01-02 ENCOUNTER — Ambulatory Visit: Admitting: Podiatry

## 2025-01-16 ENCOUNTER — Ambulatory Visit: Admitting: Podiatry
